# Patient Record
Sex: Female | Born: 1940
Health system: Southern US, Community
[De-identification: ages and names within clinical notes are randomized; demographics above are authoritative.]

## PROBLEM LIST (undated history)

## (undated) DIAGNOSIS — G629 Polyneuropathy, unspecified: Secondary | ICD-10-CM

## (undated) DIAGNOSIS — F32A Depression, unspecified: Secondary | ICD-10-CM

## (undated) DIAGNOSIS — M1990 Unspecified osteoarthritis, unspecified site: Secondary | ICD-10-CM

## (undated) DIAGNOSIS — IMO0002 Reserved for concepts with insufficient information to code with codable children: Secondary | ICD-10-CM

## (undated) DIAGNOSIS — E785 Hyperlipidemia, unspecified: Secondary | ICD-10-CM

## (undated) DIAGNOSIS — H9190 Unspecified hearing loss, unspecified ear: Secondary | ICD-10-CM

## (undated) DIAGNOSIS — K635 Polyp of colon: Secondary | ICD-10-CM

## (undated) DIAGNOSIS — M4800 Spinal stenosis, site unspecified: Secondary | ICD-10-CM

## (undated) DIAGNOSIS — T8859XA Other complications of anesthesia, initial encounter: Secondary | ICD-10-CM

## (undated) DIAGNOSIS — F319 Bipolar disorder, unspecified: Secondary | ICD-10-CM

## (undated) DIAGNOSIS — Z8739 Personal history of other diseases of the musculoskeletal system and connective tissue: Secondary | ICD-10-CM

## (undated) DIAGNOSIS — F329 Major depressive disorder, single episode, unspecified: Secondary | ICD-10-CM

## (undated) DIAGNOSIS — Z87440 Personal history of urinary (tract) infections: Secondary | ICD-10-CM

## (undated) DIAGNOSIS — R7303 Prediabetes: Secondary | ICD-10-CM

## (undated) DIAGNOSIS — I10 Essential (primary) hypertension: Secondary | ICD-10-CM

## (undated) DIAGNOSIS — D649 Anemia, unspecified: Secondary | ICD-10-CM

## (undated) DIAGNOSIS — E039 Hypothyroidism, unspecified: Secondary | ICD-10-CM

## (undated) DIAGNOSIS — K219 Gastro-esophageal reflux disease without esophagitis: Secondary | ICD-10-CM

## (undated) DIAGNOSIS — R7302 Impaired glucose tolerance (oral): Secondary | ICD-10-CM

## (undated) DIAGNOSIS — K5790 Diverticulosis of intestine, part unspecified, without perforation or abscess without bleeding: Secondary | ICD-10-CM

## (undated) DIAGNOSIS — R52 Pain, unspecified: Secondary | ICD-10-CM

## (undated) DIAGNOSIS — S90211A Contusion of right great toe with damage to nail, initial encounter: Secondary | ICD-10-CM

## (undated) DIAGNOSIS — T4145XA Adverse effect of unspecified anesthetic, initial encounter: Secondary | ICD-10-CM

## (undated) DIAGNOSIS — M67471 Ganglion, right ankle and foot: Secondary | ICD-10-CM

## (undated) HISTORY — DX: Personal history of other diseases of the musculoskeletal system and connective tissue: Z87.39

## (undated) HISTORY — DX: Impaired glucose tolerance (oral): R73.02

## (undated) HISTORY — DX: Unspecified osteoarthritis, unspecified site: M19.90

## (undated) HISTORY — DX: Major depressive disorder, single episode, unspecified: F32.9

## (undated) HISTORY — DX: Reserved for concepts with insufficient information to code with codable children: IMO0002

## (undated) HISTORY — DX: Polyp of colon: K63.5

## (undated) HISTORY — DX: Diverticulosis of intestine, part unspecified, without perforation or abscess without bleeding: K57.90

## (undated) HISTORY — DX: Spinal stenosis, site unspecified: M48.00

## (undated) HISTORY — DX: Depression, unspecified: F32.A

## (undated) HISTORY — DX: Essential (primary) hypertension: I10

## (undated) HISTORY — DX: Polyneuropathy, unspecified: G62.9

## (undated) HISTORY — DX: Hypothyroidism, unspecified: E03.9

## (undated) HISTORY — PX: TONSILLECTOMY AND ADENOIDECTOMY: SUR1326

## (undated) HISTORY — DX: Bipolar disorder, unspecified: F31.9

## (undated) HISTORY — PX: TOTAL KNEE ARTHROPLASTY: SHX125

## (undated) HISTORY — DX: Hyperlipidemia, unspecified: E78.5

## (undated) HISTORY — DX: Anemia, unspecified: D64.9

---

## 1974-06-17 HISTORY — PX: TUBAL LIGATION: SHX77

## 1998-07-28 ENCOUNTER — Encounter: Payer: Self-pay | Admitting: Orthopedic Surgery

## 1998-08-03 ENCOUNTER — Inpatient Hospital Stay (HOSPITAL_COMMUNITY): Admission: RE | Admit: 1998-08-03 | Discharge: 1998-08-07 | Payer: Self-pay | Admitting: Orthopedic Surgery

## 1998-08-10 ENCOUNTER — Encounter (HOSPITAL_COMMUNITY): Admission: RE | Admit: 1998-08-10 | Discharge: 1998-11-08 | Payer: Self-pay | Admitting: Orthopedic Surgery

## 2000-06-17 HISTORY — PX: ABDOMINAL HYSTERECTOMY: SHX81

## 2000-11-16 ENCOUNTER — Encounter: Payer: Self-pay | Admitting: Obstetrics and Gynecology

## 2000-11-19 ENCOUNTER — Encounter (INDEPENDENT_AMBULATORY_CARE_PROVIDER_SITE_OTHER): Payer: Self-pay

## 2000-11-19 ENCOUNTER — Inpatient Hospital Stay (HOSPITAL_COMMUNITY): Admission: RE | Admit: 2000-11-19 | Discharge: 2000-11-21 | Payer: Self-pay | Admitting: Obstetrics and Gynecology

## 2001-07-24 ENCOUNTER — Encounter: Payer: Self-pay | Admitting: Obstetrics and Gynecology

## 2001-07-24 ENCOUNTER — Encounter: Admission: RE | Admit: 2001-07-24 | Discharge: 2001-07-24 | Payer: Self-pay | Admitting: Obstetrics and Gynecology

## 2002-01-29 ENCOUNTER — Encounter: Admission: RE | Admit: 2002-01-29 | Discharge: 2002-01-29 | Payer: Self-pay | Admitting: Obstetrics and Gynecology

## 2002-01-29 ENCOUNTER — Encounter: Payer: Self-pay | Admitting: Obstetrics and Gynecology

## 2002-04-06 ENCOUNTER — Other Ambulatory Visit: Admission: RE | Admit: 2002-04-06 | Discharge: 2002-04-06 | Payer: Self-pay | Admitting: Obstetrics and Gynecology

## 2003-01-31 ENCOUNTER — Encounter: Admission: RE | Admit: 2003-01-31 | Discharge: 2003-01-31 | Payer: Self-pay | Admitting: Obstetrics and Gynecology

## 2003-01-31 ENCOUNTER — Encounter: Payer: Self-pay | Admitting: Obstetrics and Gynecology

## 2003-09-05 ENCOUNTER — Other Ambulatory Visit: Admission: RE | Admit: 2003-09-05 | Discharge: 2003-09-05 | Payer: Self-pay | Admitting: Obstetrics and Gynecology

## 2004-02-24 ENCOUNTER — Encounter: Admission: RE | Admit: 2004-02-24 | Discharge: 2004-02-24 | Payer: Self-pay | Admitting: Obstetrics and Gynecology

## 2004-03-12 ENCOUNTER — Encounter: Admission: RE | Admit: 2004-03-12 | Discharge: 2004-03-12 | Payer: Self-pay | Admitting: Neurology

## 2005-03-29 ENCOUNTER — Encounter: Admission: RE | Admit: 2005-03-29 | Discharge: 2005-03-29 | Payer: Self-pay | Admitting: Obstetrics and Gynecology

## 2005-06-13 ENCOUNTER — Ambulatory Visit (HOSPITAL_COMMUNITY): Admission: RE | Admit: 2005-06-13 | Discharge: 2005-06-13 | Payer: Self-pay | Admitting: Internal Medicine

## 2006-01-24 ENCOUNTER — Ambulatory Visit: Payer: Self-pay | Admitting: Internal Medicine

## 2006-02-05 ENCOUNTER — Ambulatory Visit: Payer: Self-pay | Admitting: Internal Medicine

## 2006-02-19 ENCOUNTER — Inpatient Hospital Stay (HOSPITAL_COMMUNITY): Admission: RE | Admit: 2006-02-19 | Discharge: 2006-02-22 | Payer: Self-pay | Admitting: Orthopedic Surgery

## 2006-04-09 ENCOUNTER — Encounter: Admission: RE | Admit: 2006-04-09 | Discharge: 2006-04-09 | Payer: Self-pay | Admitting: Internal Medicine

## 2006-07-01 ENCOUNTER — Ambulatory Visit (HOSPITAL_COMMUNITY): Admission: RE | Admit: 2006-07-01 | Discharge: 2006-07-01 | Payer: Self-pay | Admitting: Neurosurgery

## 2007-04-22 ENCOUNTER — Encounter: Admission: RE | Admit: 2007-04-22 | Discharge: 2007-04-22 | Payer: Self-pay | Admitting: Internal Medicine

## 2007-10-27 ENCOUNTER — Ambulatory Visit: Payer: Self-pay | Admitting: Internal Medicine

## 2007-11-03 ENCOUNTER — Encounter: Payer: Self-pay | Admitting: Internal Medicine

## 2007-11-03 ENCOUNTER — Ambulatory Visit: Payer: Self-pay | Admitting: Internal Medicine

## 2007-11-05 ENCOUNTER — Encounter: Payer: Self-pay | Admitting: Internal Medicine

## 2008-05-27 ENCOUNTER — Encounter: Admission: RE | Admit: 2008-05-27 | Discharge: 2008-05-27 | Payer: Self-pay | Admitting: Neurology

## 2008-08-02 ENCOUNTER — Encounter: Admission: RE | Admit: 2008-08-02 | Discharge: 2008-08-02 | Payer: Self-pay | Admitting: Internal Medicine

## 2008-08-02 ENCOUNTER — Encounter: Admission: RE | Admit: 2008-08-02 | Discharge: 2008-08-02 | Payer: Self-pay | Admitting: Neurology

## 2008-08-05 ENCOUNTER — Encounter: Admission: RE | Admit: 2008-08-05 | Discharge: 2008-08-05 | Payer: Self-pay | Admitting: Internal Medicine

## 2009-08-28 ENCOUNTER — Encounter: Admission: RE | Admit: 2009-08-28 | Discharge: 2009-08-28 | Payer: Self-pay | Admitting: Internal Medicine

## 2010-02-27 ENCOUNTER — Emergency Department (HOSPITAL_COMMUNITY): Admission: EM | Admit: 2010-02-27 | Discharge: 2010-02-27 | Payer: Self-pay | Admitting: Family Medicine

## 2010-07-07 ENCOUNTER — Encounter: Payer: Self-pay | Admitting: Internal Medicine

## 2010-07-08 ENCOUNTER — Encounter: Payer: Self-pay | Admitting: Internal Medicine

## 2010-08-30 LAB — COMPREHENSIVE METABOLIC PANEL
ALT: 17 U/L (ref 0–35)
AST: 19 U/L (ref 0–37)
Albumin: 4.3 g/dL (ref 3.5–5.2)
Alkaline Phosphatase: 87 U/L (ref 39–117)
BUN: 14 mg/dL (ref 6–23)
Chloride: 104 mEq/L (ref 96–112)
GFR calc Af Amer: >60 mL/min (ref >60)
Potassium: 4.2 mEq/L (ref 3.5–5.1)
Sodium: 140 mEq/L (ref 135–145)
Total Protein: 7.7 g/dL (ref 6.0–8.3)

## 2010-08-30 LAB — DIFFERENTIAL
Basophils Relative: 0 % (ref 0–1)
Eosinophils Relative: 3 % (ref 0–5)
Monocytes Absolute: 0.7 10*3/uL (ref 0.1–1.0)
Monocytes Relative: 8 % (ref 3–12)
Neutro Abs: 4.9 10*3/uL (ref 1.7–7.7)

## 2010-08-30 LAB — URINE CULTURE: Colony Count: 7000

## 2010-08-30 LAB — POCT URINALYSIS DIPSTICK
Glucose, UA: NEGATIVE mg/dL
Nitrite: NEGATIVE
Protein, ur: NEGATIVE mg/dL
Urobilinogen, UA: 0.2 mg/dL (ref 0.0–1.0)

## 2010-08-30 LAB — CBC
Platelets: 357 10*3/uL (ref 150–400)
RBC: 3.87 MIL/uL (ref 3.87–5.11)
RDW: 12.7 % (ref 11.5–15.5)
WBC: 8.3 10*3/uL (ref 4.0–10.5)

## 2010-10-24 ENCOUNTER — Other Ambulatory Visit: Payer: Self-pay | Admitting: Internal Medicine

## 2010-10-24 DIAGNOSIS — Z1231 Encounter for screening mammogram for malignant neoplasm of breast: Secondary | ICD-10-CM

## 2010-11-02 NOTE — Op Note (Signed)
Underwood:  Theresa Underwood, Theresa Underwood NO.:  [id number]   MEDICAL RECORD NO.:  [id number]          PATIENT TYPE:  INP   LOCATION:  0005                         FACILITY:  [facility]   PHYSICIAN:  Ollen Gross, M.D.    DATE OF BIRTH:  1940-12-05   DATE OF PROCEDURE:  02/19/2006  DATE OF DISCHARGE:                                 OPERATIVE REPORT   PREOPERATIVE DIAGNOSIS:  Osteoarthritis, right knee.   POSTOPERATIVE DIAGNOSIS:  Osteoarthritis, right knee.   PROCEDURE:  Right total knee arthroplasty.   SURGEON:  Ollen Gross, M.D.   ASSISTANT:  Alexzandrew L. Julien Girt, P.A.   ANESTHESIA:  General with postoperative Marcaine pain pump.   ESTIMATED BLOOD LOSS:  Minimal.   DRAINS:  Hemovac x1,   TOURNIQUET TIME:  35 minutes at 300 mmHg.   COMPLICATIONS:  None.   CONDITION:  Stable to recovery.   BRIEF CLINICAL NOTE:  [name] is a 70 year old female with end-stage  osteoarthritis of the right knee with intractable pain.  She presents now  for right total knee arthroplasty.   PROCEDURE IN DETAIL:  After successful administration of general anesthetic,  a tourniquet was placed high on the right thigh and right lower extremity  prepped and draped in usual sterile fashion.  Extremities was wrapped in  Esmarch, knee flexed, tourniquet inflated to 300 mmHg.  Midline incision was  made with a 10 blade through subcutaneous tissue to the level of the  extensor mechanism.  Fresh blade was used make a medial parapatellar  arthrotomy.  Soft tissue over the proximal medial tibia is subperiosteally  elevated to the joint line with the knife and into the semimembranosus bursa  with a Cobb elevator.  Soft tissue of the lateral tibia is also elevated  with attention being paid to avoid patellar tendon on tibial tubercle.  Patella subluxed laterally, knee flexed 90 degrees, ACL and PCL removed.  A  drill is used to create a starting hole in the distal femur, and canal is  thoroughly  irrigated.  A 5-degree right valgus alignment guide is placed and  referencing off the posterior condyles rotations marked and a block pinned  to remove 10 mm off of the distal femur.  Distal femoral resection is made  an oscillating saw.  Sizing block is placed and size 3 is most appropriate.  Rotations marked at the epicondylar axis.  Size 3 cutting block is placed  and the anterior, posterior and chamfer cuts are made.   Tibia subluxed forward and menisci removed.  Extramedullary tibial alignment  guide is placed referencing proximally at the medial aspect of the tibial  tubercle and distally along the second metatarsal axis and tibial crest.  The blocks pinned to remove 10 mm off the non-deficient lateral side.  Tibial resection is made with an oscillating saw.  Size 3 is the most  appropriate tibial component, and the proximal tibia is prepared with a  modular drill and keel punch for a size 3.  Femoral preparation is completed  with the intercondylar cut.   Size 3 mobile  bearing tibial trial with a size 3 posterior stabilized  femoral trial and 10-mm posterior stabilized rotating platform insert trial  are placed.  With the 10, full extension is achieved with excellent varus  and valgus balance throughout full range of motion.  The patella is then  everted and thickness measured to be 21 mm.  Freehand resection is taken to  11 mm, 38 templates placed, lug holes are drilled, trial patella is placed  and it tracks normally.  Osteophytes are then removed off of the posterior  femur with the trial in place.  All trials are removed and the cut bone  surfaces prepared with pulsatile lavage.  Cement mixed and once ready for  implantation a size 3 mobile bearing tibial tray, size 3 posterior  stabilized femur and 38 patella are cemented into place.  The patella was  held the clamp.  Trial 10-mm inserts placed and knee held in full extension  and all extruded cement removed.  Once the cement  is fully hardened, the  permanent 10-mm posterior stabilized rotating platform insert is placed into  the tibial tray.  The wound is then copiously irrigated with saline  solution.  The extensor mechanism closed over a Hemovac drain with  interrupted #1 PDS.  Flexion against gravity is 140 degrees.  Subcu is  closed with interrupted 2-0 Vicryl and subcuticular running 4-0 Monocryl.  The catheter for Marcaine pain pump is placed and the pump was initiated.  The drains hooked to suction and Steri-Strips and bulky sterile dressing  applied.  The right lower extremity is placed into a knee immobilizer, and  she is awakened and transported to recovery in stable condition.      Ollen Gross, M.D.  Electronically Signed     FA/MEDQ  D:  02/19/2006  T:  02/19/2006  Job:  [id number]

## 2010-11-02 NOTE — Discharge Summary (Signed)
NAME:  Theresa Underwood, Theresa Underwood NO.:  [id number]   MEDICAL RECORD NO.:  [id number]          PATIENT TYPE:  INP   LOCATION:  1504                         FACILITY:  [facility]   PHYSICIAN:  Ollen Gross, M.D.    DATE OF BIRTH:  Feb 20, 1941   DATE OF ADMISSION:  02/19/2006  DATE OF DISCHARGE:  02/22/2006                                 DISCHARGE SUMMARY   ADMITTING DIAGNOSES:  1. Osteoarthritis, right knee.  2. Depression.  3. Bipolar disorder.  4. Peripheral neuropathy.  5. Hypertension.  6. Occasional reflux disease.  7. Hemorrhoids.  8. Hypothyroidism.  9. History of anemia.  10.Lumbar degenerative disk disease.  11.Cervical degenerative disk disease.  12.Post menopausal.   DISCHARGE DIAGNOSES:  1. Osteoarthritis, right knee, status post right total knee arthroplasty.  2. Acute blood loss anemia, did not require transfusion.  3. Postoperative hyponatremia, improved.  4. Depression.  5. Bipolar disorder.  6. Peripheral neuropathy.  7. Hypertension.  8. Occasional reflux disease.  9. Hemorrhoids.  10.Hypothyroidism.  11.History of anemia.  12.Lumbar degenerative disk disease.  13.Cervical degenerative disk disease.  14.Post menopausal.   PROCEDURE:  On February 19, 2006, right total knee.  Surgeon:  Ollen Gross, M.D.  Assistant:  Alexzandrew L. Julien Girt, P.A.  Anesthesia:  General.   CONSULTATIONS:  None.   BRIEF HISTORY:  Ms. Lepkowski is a 70 year old female with end stage  osteoarthritis of the right knee with intractable pain now presents for a  total knee.   LABORATORY DATA:  Pre-op CBC:  Hemoglobin 12.6, hematocrit 36.3, white cell  count 7.4.  Followup CBC showed hemoglobin dropped down to 8.9, then down to  8.0, it came back to 8.4, last noted H&H was 8.6 and 24.9.  PT PTT on  admission were 12.6 and 31, respectively.  INR was 0.9.  Serial pro times  followed.  Last known PT INR 23.2 and 2.0.  Chem panel on admission elevated  potassium on admission  of 5.3 and elevated sodium of 147.  Remaining chem  panel within normal limits.  Serial B-METs are followed.  Potassium came  down to a normal level of 3.8.  Sodium did drop down to 134 back up to 138.  Pre-op UA negative.  Blood type B positive.   HOSPITAL COURSE:  Admitted to [facility], tolerated procedure  well, later transferred to the recovery room on the orthopedic floor,  started on PCA and p.o. analgesics for pain control following surgery,  started on Coumadin for DVT prophylaxis, was hurting a fair amount through  the night, did get some control with the PCA and p.o. meds, was doing fairly  well on day 1.  Hemoglobin was down to 8.9 but placed on iron supplements  but no symptoms.  Started with physical therapy by day 2 was sitting up in  the chair already on morning rounds, was doing better with pain control, did  get up and walk 140 feet on day 1 but did get a little tired.  Rechecked the  H&H, it was down to 8.0 but  at that time she had no symptoms.  Felt to be a  little positive on the volume, so she was given low dose Lasix, fluids were  discontinued, started getting up and ambulate even a little bit more, up  about 200 feet, did well with that.  She was progressing well.  Hemoglobin  was rechecked.  Hemoglobin had already started coming back up to 8.6, had no  symptoms, progressing well, and was discharged home on the following day of  February 22, 2006.   DISCHARGE PLAN:  The patient was discharged home on February 22, 2006.   DISCHARGE DIAGNOSES:  1. Osteoarthritis, right knee, status post right total knee arthroplasty.  2. Acute blood loss anemia, did not require transfusion.  3. Postoperative hyponatremia, improved.  4. Depression.  5. Bipolar disorder.  6. Peripheral neuropathy.  7. Hypertension.  8. Occasional reflux disease.  9. Hemorrhoids.  10.Hypothyroidism.  11.History of anemia.  12.Lumbar degenerative disk disease.  13.Cervical degenerative  disk disease.  14.Post menopausal.   DISCHARGE MEDICATIONS:  Coumadin, Percocet, Robaxin.   DIET:  Resume previous home diet.   ACTIVITY:  Weightbearing as tolerated to the right lower extremity.  Home  health PT, home health nursing, total knee protocol.  May start showering.   FOLLOWUP:  Follow up in two weeks.   DISPOSITION:  Home.   CONDITION ON DISCHARGE:  Improved.      Alexzandrew L. Julien Girt, P.A.      Ollen Gross, M.D.  Electronically Signed    ALP/MEDQ  D:  03/04/2006  T:  03/04/2006  Job:  [id number]   cc:   Gwen Pounds, MD  Fax: [phone]

## 2010-11-02 NOTE — Discharge Summary (Signed)
[facility] [place]  Patient:    Theresa Underwood, Theresa Underwood                       MRN: [id number] Adm. Date:  [id number] Disc. Date: [id number] Attending:  Madelyn Flavors                           Discharge Summary  HISTORY OF CURRENT ILLNESS:   The patient is a 70 year old, gravida 2, para 2, Caucasian female who was recently seen by Dr. Thomasena Edis in March complaining of symptomatic uterine prolapse. She was also complaining of cystocele and rectocele and stress urinary incontinence with coughing and sneezing. For further details, please see the dictated history and physical.  HOSPITAL COURSE:              The patient was admitted and underwent a TAH/BSO and posterior repair. The patient originally had been planned to have a vaginal hysterectomy but she became quite scared about developing ovarian cancer and thus, desired a TAH/BSO so that we could take a careful look at her ovaries and insure that they could be removed, and also so that we could look at all her peritoneal surfaces. She was found, after removal of the uterus, to only have a rectocele with no cystocele noted. Thus, she underwent a TAH/BSO and posterior repair. For further details, please see the operative report.  Postoperatively, the patient was receiving adequate analgesia, had excellent urine output. On postoperative day #1, her vaginal packing was removed, hemoglobin was 10.0, white count was 12,600, platelet count 301,000.  On postoperative day #2, the patient was doing well, Tmax 100.1, otherwise normal. There was no CVA tenderness, no calf tenderness, no vaginal bleeding. Posterior repair was noted to be intact without erythema. The patient was discharged home on November 21, 2000 on postoperative day #2. She did have a check of her CBC and urinalysis due to a low-grade temperature which were within normal limits. Her white count was 12,700 which was only slightly elevated and it was really no  change from the day before. Hemoglobin was 9.6. There was no left shift. Urinalysis was negative.  DISCHARGE MEDICATIONS:        1. Tylox one to two p.o. q.3-4h. p.r.n. pain.                               2. Colace 100 mg p.o. b.i.d. for seven days.   DISCHARGE INSTRUCTIONS:       She was urged to place nothing in her rectum, do no heavy lifting, and call with any problems.  DISCHARGE FOLLOWUP:           She is to return to the office in seven days for staples to be removed.  DD:  01/16/01 TD:  01/16/01 Job: [id number]

## 2010-11-02 NOTE — H&P (Signed)
NAME:  Theresa, Underwood NO.:  [id number]   MEDICAL RECORD NO.:  [id number]          PATIENT TYPE:  INP   LOCATION:  1504                         FACILITY:  [facility]   PHYSICIAN:  Ollen Gross, M.D.    DATE OF BIRTH:  [birth year]   DATE OF ADMISSION:  02/19/2006  DATE OF DISCHARGE:                                HISTORY & PHYSICAL   CHIEF COMPLAINT:  Right knee pain.   HISTORY OF PRESENT ILLNESS:  The patient is a 70 year old female who has  been seen by Dr. Lequita Halt for ongoing knee pain.  She has had a previous left  total knee and has done well with it.  The right knee has been giving her  problems for quite some time now.  It has progressively gotten worse.  It is  interfering with her activities.  She has been seen and found to have  significant arthritis with bone-on-bone changes.  It is felt she would  benefit from  undergoing replacement.  Risks and benefits were discussed  with the patient who understands and was admitted to hospital.   ALLERGIES:  SULFA, TEGRETOL.  INTOLERANCES:  OXYCODONE CAUSES SOME MILD  SICKNESS AND STOMACH PROBLEMS.   CURRENT MEDICATIONS:  Gabapentin, lisinopril, Synthroid, clonazepam,  calcium, vitamin D, women's multivitamin, slow release iron, ibuprofen.   PAST MEDICAL HISTORY:  Some history of depression, bipolar disorder,  hypertension, occasional reflux, hemorrhoids, hypothyroidism, history of  anemia, post menopausal.  Peripheral neuropathy.   PAST SURGICAL HISTORY:  Tonsillectomy, adenoidectomy age 70.  Bilateral tubal  ligation.  She has undergone a left total knee arthroplasty in 2000, also a  total abdominal hysterectomy.   SOCIAL HISTORY:  Married.  She is retired Engineer, civil (consulting) and also Network engineer.  Nonsmoker.  Occasional intake of alcohol.  Has two children.   FAMILY HISTORY:  Father with history of hypertension.  Mother with history  of arthritis, anemia with father and has a daughter who is diabetic.   REVIEW OF  SYSTEMS:  GENERAL:  No fever, chills, night sweats.  NEUROLOGIC:  No seizures, syncope, paralysis.  RESPIRATORY:  No shortness of breath,  productive cough or hemoptysis.  CARDIAC:  No chest pain __________  GI:  No  nausea, vomiting, diarrhea or constipation.  GENITOURINARY:  Has a little  bit of urgency, no dysuria, hematuria.  MUSCULOSKELETAL:  Right knee.   PHYSICAL EXAMINATION:  VITAL SIGNS:  Pulse 68, respiratory rate 14, blood  pressure 120/68.  GENERAL:  The patient is a 70 year old white female well-developed, well-  nourished, in no acute distress, alert, oriented, cooperative.  Pleasant.  Appears to be a good historian.  HEENT:  Normocephalic and atraumatic, pupils equal, round reactive.  Oropharynx is clear.  EOMs intact.  NECK:  Supple, no carotid bruits are appreciated on exam.  CHEST:  Clear anterior and posterior chest.  No rales, rhonchi, rales or  wheezing.  HEART:  Regular rhythm with a faint early sytolic ejection murmur best heard  over aortic point graded 2/6.  ABDOMEN:  Soft, nontender, bowel sounds present.  RECTUM/BREAST/GENITALIA:  Not done, not pertinent present illness.  EXTREMITIES:  Right knee shows range of motion of 5 to 125, marked crepitus  is noted, more tender medial than lateral.   IMPRESSION:  1. Osteoarthritis right knee.  2. Depression.  3. Bipolar disorder.  4. Peripheral neuropathy.  5. Hypertension.  6. Occasional reflux disease.  7. Hemorrhoids.  8. Hypothyroidism.  9. History of anemia.  10.Lumbar degenerative disk disease.  11.Cervical degenerative disk disease.  12.Post menopausal.   PLAN:  Patient admitted [facility], undergo a right  total knee arthroplasty.  Surgery will be performed by Dr. Ollen Gross.      Alexzandrew L. Julien Girt, P.A.      Ollen Gross, M.D.  Electronically Signed    ALP/MEDQ  D:  02/21/2006  T:  02/21/2006  Job:  [id number]   cc:   Gwen Pounds, MD  Fax: [phone]

## 2010-11-02 NOTE — Op Note (Signed)
[facility]   Patient:    Theresa Underwood, MADANI                       MRN: [id number] Adm. Date:  [id number] Attending:  Madelyn Flavors CC:         Georgina Peer, M.D.   Operative Report  PREOPERATIVE DIAGNOSIS:       Symptomatic uterine prolapse 618.1, cystocele and rectocele 618.0.  POSTOPERATIVE DIAGNOSIS:      Symptomatic uterine prolapse 618.1, cystocele and rectocele 618.0.  Resolved cystocele after TAH.  OPERATION:                    Total abdominal hysterectomy, bilateral salpingo-oophorectomy, posterior colporrhaphy.  SURGEON:                      Beather Arbour. Thomasena Edis, M.D.  ASSISTANT:                    Georgina Peer, M.D.  ANESTHESIA:                   General endotracheal anesthesia.  ESTIMATED BLOOD LOSS:         350 cc.  FLUIDS:                       Approximately 3200 cc of crystalloid.  DRAINS:                       Foley.  COMPLICATIONS:                None.  Vaginal packing in place.  FINDINGS:                     No evidence of ovarian pathology.  No enlarged nodes and all peritoneal surfaces noted to be smooth.  DESCRIPTION OF PROCEDURE:     The patient was brought to the operating room and identified on the operating table.  After induction of adequate general endotracheal anesthesia, the patient was placed in the low lithotomy position and prepped and draped in the usual sterile fashion.  A Foley catheter was placed.  The patients friend from nursing school with recently diagnosed with ovarian cancer after a normal ultrasound in January, thus the patient was adamant that her ovaries be removed in their entirety and desired [place] to inspect her peritoneal surfaces.  The case had been initially scheduled for [facility].  After placing the patient in the low lithotomy position, a Pfannenstiel incision was made and carried down to the fascia.  The fascia was scored in the midline and extended bilaterally using Mayo scissors.  It  was then separated free from the underlying muscles.  The muscles were separated in the midline down to the symphysis.  The patient was placed in Trendelenburg and the peritoneum was entered sharply and carefully taking care to avoid bowel or other abdominal contents.  The peritoneal incision was then extended superiorly and then inferiorly down to the bladder edge.  The OConnor-OSullivan retractor was placed, the bladder blade was placed, the bowel was packed out of the operative field, and the fourth-arm retractor was placed.  Prior to packing the bowel, all peritoneal surfaces were examined and noted to be smooth, the liver edge was smooth.  There were no enlarged pelvic or peri-aortic nodes.  The uterus was freely mobile and elevated up to the  level of the abdominal incision.  Long Kelly clamps were placed at either lateral border of the uterus.  The right round ligament was ligated with a suture of 0 Monocryl, divided using cautery, and the anterior leaf of the broad ligament was divided to the area overlying the internal os.  The clear space was developed and the infundibulopelvic ligament was clamped taking care to not leave any ovary behind.  It was then cut, tied with a free tie, and then a suture ligature of 0 Monocryl.  This was accomplished on the left in a similar fashion.  The vessels on the right were skeletonized.  There was noted to be a small fibroid approximately 3 cm at the level of the internal os on the right.  After the vessels were skeletonized on the right, the vessels were clamped with a Heaney, cut, and suture ligated using a suture of 0 Monocryl. There was noted to be a slight amount of oozing from one vessel, but excellent hemostasis was achieved using a right angle clamp across this vessel and ligating it.  In a similar fashion, the vessels were skeletonized on the left.  The uterine vessels were clamped, cut, and suture ligated using a suture of 0  Monocryl. Successive cardinal and uterosacral ligament bites were taken bilaterally using straight Heaney clamps.  The uterosacral ligaments on the right were clamped with a curved Heaney, cut, and suture ligated using a figure-of-eight suture of 0 Monocryl.  A curved Heaney was placed across the distal portion of the cervix, cut, and the vagina was entered.  A figure-of-eight suture of 0 Monocryl was placed.  This was accomplished on the left in a similar fashion. Using Satinsky scissors the cervix was separated from the vagina.  The Kochers were used to graft the vaginal mucosa and the vagina was closed using interrupted figure-of-eight sutures of 0 Monocryl.  All pedicles were examined for evidence of hemostasis and excellent hemostasis was noted.  The pelvis was irrigated copiously with warm Ringers lactate.  Again all pedicles were noted to be very hemostatic.  Both ureters were examined and noted to be well away from any of the operative sites and were noted to be parastalsing normally. The uterosacral ligaments were plicated using a suture of 0 Monocryl.  At that point, the patient was taken out of Trendelenburg, the abdominal packs were removed.  The appendix was noted to be normal.  The OConnor-OSullivan retractor was removed.  The Kelly clamps were placed on the peritoneum and the peritoneum was closed using a simple running suture of 2-0 Monocryl.  The subfascial areas were examined for evidence of hemostasis and excellent hemostasis achieved using cautery.  The fascia was closed using two sutures of 0 PDS with each suture anchored at either angle of the incision, run to the midline, and tied.  The skin was closed with staples.  Attention was then turned to the anterior and posterior repair.  The patient was then placed in higher lithotomy position.  It should be noted that when the patient came into the operating room she was allowed to position herself in the stirrups due  to her knee replacement surgery in such a way that she was very comfortable.  The  cystocele and rectocele were again examined.  There was noted to be totally resolved cystocele with excellent UV angle support after removal of the uterus. However, there was noted to be a larger rectocele than had been noted with the patients examinations in the  office.  The decision was made to proceed with rectocele repair.  Allis clamps were placed at approximately 2.5 cm bilaterally away from the midline and a V-shaped incision was performed. The vaginal mucosa was then incised in the midline using Metzenbaum scissors and Allis clamps were used to give traction to this areas and hold them out of the way.  This was taken up in the midline to the apex.  Using sharp dissection taking care to not buttonhole the skin, the perirectal fascia was taken down from the vaginal mucosa.  This was accomplished on the left in a similar fashion.  I was able to sweep this fascia from the vaginal mucosa very high up, very close to the apex.  There was noted to be one small bleeding vessel and excellent hemostasis was achieved using a figure-of-eight suture of 0 Monocryl.  After the perirectal fascia was mobilized, the fascia was closed using simple interrupted sutures of 0 Monocryl.  The levators were then reapproximated using a suture of 0 Monocryl.  Excellent levator support was noted.  The excess mucosa was excised and using sutures of 2-0 Monocryl, the vaginal mucosa was closed using a simple running interlocking sutures.  An additional suture was placed in the perineum and the perineal portion of the incision was closed using subcuticular sutures of 2-0 Monocryl.  A vaginal packing was placed.  There was noted to be excellent hemostasis.  The patient tolerated the procedure well without apparent complications and was transferred to the recovery room in stable condition after all instrument, sponge, and needle counts  were correct.  In addition it should be noted that there was known to be excellent levator support after rectocele repair, I was able to place three fingers into the vagina such that the patient should have no problems with intromission.  In addition the cystocele was noted to be even further reduced such that the patient had no cystocele after the rectocele repair. DD:  11/19/00 TD:  11/19/00 Job: [id number]

## 2010-11-19 ENCOUNTER — Ambulatory Visit
Admission: RE | Admit: 2010-11-19 | Discharge: 2010-11-19 | Disposition: A | Discharge status: Home / Self Care | Payer: Medicare Other | Source: Ambulatory Visit | Attending: Internal Medicine | Admitting: Internal Medicine

## 2010-11-19 DIAGNOSIS — Z1231 Encounter for screening mammogram for malignant neoplasm of breast: Secondary | ICD-10-CM

## 2011-06-19 DIAGNOSIS — M9981 Other biomechanical lesions of cervical region: Secondary | ICD-10-CM | POA: Diagnosis not present

## 2011-06-19 DIAGNOSIS — M5030 Other cervical disc degeneration, unspecified cervical region: Secondary | ICD-10-CM | POA: Diagnosis not present

## 2011-06-21 DIAGNOSIS — M5412 Radiculopathy, cervical region: Secondary | ICD-10-CM | POA: Diagnosis not present

## 2011-06-21 DIAGNOSIS — M9981 Other biomechanical lesions of cervical region: Secondary | ICD-10-CM | POA: Diagnosis not present

## 2011-06-21 DIAGNOSIS — M5030 Other cervical disc degeneration, unspecified cervical region: Secondary | ICD-10-CM | POA: Diagnosis not present

## 2011-06-25 DIAGNOSIS — M5030 Other cervical disc degeneration, unspecified cervical region: Secondary | ICD-10-CM | POA: Diagnosis not present

## 2011-06-25 DIAGNOSIS — M5412 Radiculopathy, cervical region: Secondary | ICD-10-CM | POA: Diagnosis not present

## 2011-06-25 DIAGNOSIS — M9981 Other biomechanical lesions of cervical region: Secondary | ICD-10-CM | POA: Diagnosis not present

## 2011-06-26 DIAGNOSIS — M9981 Other biomechanical lesions of cervical region: Secondary | ICD-10-CM | POA: Diagnosis not present

## 2011-06-26 DIAGNOSIS — M5412 Radiculopathy, cervical region: Secondary | ICD-10-CM | POA: Diagnosis not present

## 2011-06-26 DIAGNOSIS — M5030 Other cervical disc degeneration, unspecified cervical region: Secondary | ICD-10-CM | POA: Diagnosis not present

## 2011-06-27 DIAGNOSIS — M5030 Other cervical disc degeneration, unspecified cervical region: Secondary | ICD-10-CM | POA: Diagnosis not present

## 2011-06-27 DIAGNOSIS — M9981 Other biomechanical lesions of cervical region: Secondary | ICD-10-CM | POA: Diagnosis not present

## 2011-06-28 DIAGNOSIS — M5030 Other cervical disc degeneration, unspecified cervical region: Secondary | ICD-10-CM | POA: Diagnosis not present

## 2011-06-28 DIAGNOSIS — M9981 Other biomechanical lesions of cervical region: Secondary | ICD-10-CM | POA: Diagnosis not present

## 2011-07-08 DIAGNOSIS — D649 Anemia, unspecified: Secondary | ICD-10-CM | POA: Diagnosis not present

## 2011-07-15 ENCOUNTER — Encounter: Payer: Self-pay | Admitting: Internal Medicine

## 2011-07-24 DIAGNOSIS — I10 Essential (primary) hypertension: Secondary | ICD-10-CM | POA: Diagnosis not present

## 2011-07-24 DIAGNOSIS — D649 Anemia, unspecified: Secondary | ICD-10-CM | POA: Diagnosis not present

## 2011-07-24 DIAGNOSIS — H9319 Tinnitus, unspecified ear: Secondary | ICD-10-CM | POA: Diagnosis not present

## 2011-07-24 DIAGNOSIS — E039 Hypothyroidism, unspecified: Secondary | ICD-10-CM | POA: Diagnosis not present

## 2011-08-02 ENCOUNTER — Encounter: Payer: Self-pay | Admitting: Internal Medicine

## 2011-08-02 ENCOUNTER — Ambulatory Visit (INDEPENDENT_AMBULATORY_CARE_PROVIDER_SITE_OTHER): Payer: Medicare Other | Admitting: Internal Medicine

## 2011-08-02 VITALS — BP 120/68 | HR 76 | Ht 67.0 in | Wt 144.6 lb

## 2011-08-02 DIAGNOSIS — Z86010 Personal history of colonic polyps: Secondary | ICD-10-CM

## 2011-08-02 DIAGNOSIS — D509 Iron deficiency anemia, unspecified: Secondary | ICD-10-CM | POA: Diagnosis not present

## 2011-08-02 DIAGNOSIS — K3189 Other diseases of stomach and duodenum: Secondary | ICD-10-CM

## 2011-08-02 DIAGNOSIS — R1013 Epigastric pain: Secondary | ICD-10-CM | POA: Diagnosis not present

## 2011-08-02 MED ORDER — PEG-KCL-NACL-NASULF-NA ASC-C 100 G PO SOLR: 1.0000 | Freq: Once | ORAL | Status: DC

## 2011-08-02 NOTE — Progress Notes (Signed)
HISTORY OF PRESENT ILLNESS:  Theresa Underwood is a 71 y.o. female with multiple medical problems as listed below. She is sent today regarding anemia. The patient was last seen in 2009 for colonoscopy. She has been feeling well. She underwent her routine annual evaluation with Dr. Timothy Lasso. She was found to be anemic with a hemoglobin of 11.4 on 07/08/2011. MCV normal. Iron saturation normal as was the reticulocyte count. Hemoccult study from October negative. Ferritin B12 and iron saturation from October normal. Recently placed on iron with followup hemoglobin 12.7. This was obtained 07/24/2011. GI review of systems is remarkable for infrequent heartburn. As well epigastric discomfort. She avoids NSAIDs due to gastric distress. She does have occasional constipation with minor rectal bleeding. Currently on iron. Does not require acid suppressive medication. Initial colonoscopy in 2004 was remarkable for left-sided diverticulosis. Followup exam in 2009 revealed diverticulosis and right-sided hyperplastic colon polyps. Followup in 5 years recommended. She also had an upper endoscopy in August of 2007 to evaluate normocytic anemia. As well epigastric discomfort. This was normal. She tells me that her mother had a history of serious ulcer disease associated with bleeding and H. Pylori. She is concerned.  REVIEW OF SYSTEMS:  All non-GI ROS negative except for hearing problems and lower extremity neuropathy  Past Medical History  Diagnosis Date  . Diverticulosis   . Hypertension   . Hyperthyroidism   . Glucose intolerance (impaired glucose tolerance)   . Peripheral neuropathy     bilateral feet, Dr. Sandria Manly  . Bipolar disorder   . Hyperlipidemia   . Spinal stenosis   . Cholelithiasis   . Anemia   . Colon polyps   . Hemorrhoids   . Arthritis   . DDD (degenerative disc disease)   . Depression   . Prolapsed bladder   . Tinnitus   . History of TMJ disorder     Past Surgical History  Procedure Date  .  Total knee arthroplasty     bi-lateral 2000,2007  . Abdominal hysterectomy 2002    total  . Tonsillectomy and adenoidectomy     as a child  . Tubial   . Tubal ligation     Social History Theresa Underwood  reports that she has never smoked. She has never used smokeless tobacco. She reports that she drinks alcohol. She reports that she does not use illicit drugs.  family history includes Bipolar disorder in her mother; Colon polyps in her father and mother; Diverticulitis in her sister; Osteoporosis in her mother; Pancreatic cancer in her maternal grandmother; and Peripheral vascular disease in her maternal grandmother.  There is no history of Colon cancer.  Allergies  Allergen Reactions  . Carbamazepine     REACTION: hives  . Sulfonamide Derivatives     REACTION: hives       PHYSICAL EXAMINATION: Vital signs: BP 120/68  Pulse 76  Ht 5\' 7"  (1.702 m)  Wt 144 lb 9.6 oz (65.59 kg)  BMI 22.65 kg/m2  Constitutional: generally well-appearing, no acute distress Psychiatric: alert and oriented x3, cooperative Eyes: extraocular movements intact, anicteric, conjunctiva pink. Contact lenses Mouth: oral pharynx moist, no lesions Neck: supple no lymphadenopathy Cardiovascular: heart regular rate and rhythm, no murmur Lungs: clear to auscultation bilaterally Abdomen: soft, nontender, nondistended, no obvious ascites, no peritoneal signs, normal bowel sounds, no organomegaly Rectal:deferred until colonoscopy Extremities: no lower extremity edema bilaterally Skin: no lesions on visible extremities Neuro: No focal deficits. N  ASSESSMENT:  #1. Normocytic anemia. Not clearly iron  deficient. Improvement in hemoglobin on iron however. Hemoccult negative studies in October. Prior GI evaluations as noted. #2. History of right-sided hyperplastic polyps. Due for followup 2014. #3. Dyspeptic symptoms #4. General medical problems   PLAN:  #1. Colonoscopy for polyp surveillance and  evaluation of possible iron deficiency anemia.The nature of the procedure, as well as the risks, benefits, and alternatives were carefully and thoroughly reviewed with the patient. Ample time for discussion and questions allowed. The patient understood, was satisfied, and agreed to proceed. Movi prep prescribed. The patient instructed on its use excellent  #2. Upper endoscopy to evaluate dyspeptic symptoms and possible iron deficiency anemia. Can biopsy to rule out H. pylori.The nature of the procedure, as well as the risks, benefits, and alternatives were carefully and thoroughly reviewed with the patient. Ample time for discussion and questions allowed. The patient understood, was satisfied, and agreed to proceed.  #3. Continue iron until one week prior to colonoscopy. May resume thereafter if needed.

## 2011-08-02 NOTE — Patient Instructions (Signed)
You have been scheduled for an endoscopy and colonoscopy with propofol. Please follow the written instructions given to you at your visit today. Please pick up your prep at the pharmacy within the next 1-3 days.  

## 2011-08-06 ENCOUNTER — Encounter: Payer: Self-pay | Admitting: Internal Medicine

## 2011-08-07 ENCOUNTER — Encounter: Payer: Self-pay | Admitting: Internal Medicine

## 2011-08-07 ENCOUNTER — Ambulatory Visit (AMBULATORY_SURGERY_CENTER): Payer: Medicare Other | Admitting: Internal Medicine

## 2011-08-07 VITALS — BP 137/73 | HR 59 | Temp 97.2°F | Resp 14 | Ht 67.0 in | Wt 144.0 lb

## 2011-08-07 DIAGNOSIS — D509 Iron deficiency anemia, unspecified: Secondary | ICD-10-CM

## 2011-08-07 DIAGNOSIS — K222 Esophageal obstruction: Secondary | ICD-10-CM

## 2011-08-07 DIAGNOSIS — D649 Anemia, unspecified: Secondary | ICD-10-CM | POA: Diagnosis not present

## 2011-08-07 DIAGNOSIS — D126 Benign neoplasm of colon, unspecified: Secondary | ICD-10-CM | POA: Diagnosis not present

## 2011-08-07 DIAGNOSIS — E079 Disorder of thyroid, unspecified: Secondary | ICD-10-CM | POA: Diagnosis not present

## 2011-08-07 DIAGNOSIS — Z1211 Encounter for screening for malignant neoplasm of colon: Secondary | ICD-10-CM

## 2011-08-07 DIAGNOSIS — Z86010 Personal history of colonic polyps: Secondary | ICD-10-CM

## 2011-08-07 DIAGNOSIS — F3289 Other specified depressive episodes: Secondary | ICD-10-CM | POA: Diagnosis not present

## 2011-08-07 DIAGNOSIS — K5730 Diverticulosis of large intestine without perforation or abscess without bleeding: Secondary | ICD-10-CM | POA: Diagnosis not present

## 2011-08-07 DIAGNOSIS — D1330 Benign neoplasm of unspecified part of small intestine: Secondary | ICD-10-CM | POA: Diagnosis not present

## 2011-08-07 DIAGNOSIS — I10 Essential (primary) hypertension: Secondary | ICD-10-CM | POA: Diagnosis not present

## 2011-08-07 DIAGNOSIS — F329 Major depressive disorder, single episode, unspecified: Secondary | ICD-10-CM | POA: Diagnosis not present

## 2011-08-07 MED ORDER — SODIUM CHLORIDE 0.9 % IV SOLN: 500.0000 mL | INTRAVENOUS | Status: DC

## 2011-08-07 NOTE — Progress Notes (Signed)
Patient did not experience any of the following events: a burn prior to discharge; a fall within the facility; wrong site/side/patient/procedure/implant event; or a hospital transfer or hospital admission upon discharge from the facility. (G8907) Patient did not have preoperative order for IV antibiotic SSI prophylaxis. (G8918)  

## 2011-08-07 NOTE — Progress Notes (Signed)
Propofol per [facility] camp crna per protocol. See scanned intra procedure report. ewm 

## 2011-08-07 NOTE — Patient Instructions (Signed)
YOU HAD AN ENDOSCOPIC PROCEDURE TODAY AT THE [facility]: Refer to the procedure report that was given to you for any specific questions about what was found during the examination.  If the procedure report does not answer your questions, please call your gastroenterologist to clarify.  If you requested that your care partner not be given the details of your procedure findings, then the procedure report has been included in a sealed envelope for you to review at your convenience later.  YOU SHOULD EXPECT: Some feelings of bloating in the abdomen. Passage of more gas than usual.  Walking can help get rid of the air that was put into your GI tract during the procedure and reduce the bloating. If you had a lower endoscopy (such as a colonoscopy or flexible sigmoidoscopy) you may notice spotting of blood in your stool or on the toilet paper. If you underwent a bowel prep for your procedure, then you may not have a normal bowel movement for a few days.  DIET: Your first meal following the procedure should be a light meal and then it is ok to progress to your normal diet.  A half-sandwich or bowl of soup is an example of a good first meal.  Heavy or fried foods are harder to digest and may make you feel nauseous or bloated.  Likewise meals heavy in dairy and vegetables can cause extra gas to form and this can also increase the bloating.  Drink plenty of fluids but you should avoid alcoholic beverages for 24 hours.  ACTIVITY: Your care partner should take you home directly after the procedure.  You should plan to take it easy, moving slowly for the rest of the day.  You can resume normal activity the day after the procedure however you should NOT DRIVE or use heavy machinery for 24 hours (because of the sedation medicines used during the test).    SYMPTOMS TO REPORT IMMEDIATELY: A gastroenterologist can be reached at any hour.  During normal business hours, 8:30 AM to 5:00 PM Monday through Friday,  call [phone].  After hours and on weekends, please call the GI answering service at [phone] who will take a message and have the physician on call contact you.   Following lower endoscopy (colonoscopy or flexible sigmoidoscopy):  Excessive amounts of blood in the stool  Significant tenderness or worsening of abdominal pains  Swelling of the abdomen that is new, acute  Fever of 100F or higher  Following upper endoscopy (EGD)  Vomiting of blood or coffee ground material  New chest pain or pain under the shoulder blades  Painful or persistently difficult swallowing  New shortness of breath  Fever of 100F or higher  Black, tarry-looking stools  FOLLOW UP: If any biopsies were taken you will be contacted by phone or by letter within the next 1-3 weeks.  Call your gastroenterologist if you have not heard about the biopsies in 3 weeks.  Our staff will call the home number listed on your records the next business day following your procedure to check on you and address any questions or concerns that you may have at that time regarding the information given to you following your procedure. This is a courtesy call and so if there is no answer at the home number and we have not heard from you through the emergency physician on call, we will assume that you have returned to your regular daily activities without incident.  SIGNATURES/CONFIDENTIALITY: You and/or your care   partner have signed paperwork which will be entered into your electronic medical record.  These signatures attest to the fact that that the information above on your After Visit Summary has been reviewed and is understood.  Full responsibility of the confidentiality of this discharge information lies with you and/or your care-partner.  

## 2011-08-07 NOTE — Op Note (Signed)
[facility] [address] [place], [place]  [address]  ENDOSCOPY PROCEDURE REPORT  PATIENT:  Theresa, Underwood  MR#:  [id number] BIRTHDATE:  12-07-1940, 70 yrs. old  GENDER:  female  ENDOSCOPIST:  Wilhemina Bonito. Eda Keys, MD Referred by:  Office  PROCEDURE DATE:  08/07/2011 PROCEDURE:  EGD with biopsy, 78[address] ASA CLASS:  Class II INDICATIONS:  ? iron deficiency anemia, dyspepsia  MEDICATIONS:   MAC sedation, administered by CRNA, propofol (Diprivan) 100 mg IV TOPICAL ANESTHETIC:  none  DESCRIPTION OF PROCEDURE:   After the risks benefits and alternatives of the procedure were thoroughly explained, informed consent was obtained.  The [facility] GIF-[id number] [id number] endoscope was introduced through the mouth and advanced to the third portion of the duodenum, without limitations.  The instrument was slowly withdrawn as the mucosa was fully examined. <<PROCEDUREIMAGES>>  The upper, middle, and distal third of the esophagus were carefully inspected and no abnormalities were noted. Possible distal ring.. The z-line was well seen at the GEJ. The endoscope was pushed into the fundus which was normal including a retroflexed view. The antrum,gastric body, first and second/ third part of the duodenum were unremarkable. Post bulbar bx taken. Retroflexed views revealed no abnormalities.    The scope was then withdrawn from the patient and the procedure completed.  COMPLICATIONS:  None  ENDOSCOPIC IMPRESSION: 1) Essentially Normal EGD 2) S/P duodenal biopsies  RECOMMENDATIONS: 1) Await biopsy results 2) Continue iron for 4 weeks then stop 3) GI follow up as needed  ______________________________ Wilhemina Bonito. Eda Keys, MD  CC:  Creola Corn, MD;  The Patient  n. eSIGNED:   Wilhemina Bonito. Eda Keys at 08/07/2011 03:16 PM  Aggie Moats, [phone]

## 2011-08-07 NOTE — Op Note (Signed)
[facility] [address] [place], [place]  [address]  COLONOSCOPY PROCEDURE REPORT  PATIENT:  Theresa, Underwood  MR#:  [id number] BIRTHDATE:  March 30, 1941, 70 yrs. old  GENDER:  female ENDOSCOPIST:  Wilhemina Bonito. Eda Keys, MD REF. BY:  Office PROCEDURE DATE:  08/07/2011 PROCEDURE:  Surveillance Colonoscopy ASA CLASS:  Class II INDICATIONS:  history of hyperplastic polyps, surveillance and high-risk screening, Anemia (? iron def) ; LAST EXAM 2009 W/ RIGHT SIDED HPP MEDICATIONS:   MAC sedation, administered by CRNA, propofol (Diprivan) 250 mg IV  DESCRIPTION OF PROCEDURE:   After the risks benefits and alternatives of the procedure were thoroughly explained, informed consent was obtained.  Digital rectal exam was performed and revealed no abnormalities.   The LB CF-[id number] endoscope was introduced through the anus and advanced to the cecum, which was identified by both the appendix and ileocecal valve, without limitations.  The quality of the prep was excellent, using MoviPrep.  The instrument was then slowly withdrawn as the colon was fully examined. <<PROCEDUREIMAGES>>  FINDINGS:  Moderate diverticulosis was found in the left colon. Otherwise normal colonoscopy without  polyps, masses, vascular ectasias, or inflammatory changes.   Retroflexed views in the rectum revealed no abnormalities.    The time to cecum = 4:51 minutes. The scope was then withdrawn in  9:30  minutes from the cecum and the procedure completed.  COMPLICATIONS:  None  ENDOSCOPIC IMPRESSION: 1) Moderate diverticulosis in the left colon 2) Otherwise normal colonoscopy  RECOMMENDATIONS: 1) Continue current colorectal screening recommendations for "routine risk" patients with a repeat colonoscopy in 10 years. 2) Upper endoscopy today  ______________________________ Wilhemina Bonito. Eda Keys, MD  CC:  Creola Corn, MD;  The Patient  n. eSIGNED:   Wilhemina Bonito. Eda Keys at 08/07/2011 03:10 PM  Aggie Moats, [phone]

## 2011-08-08 ENCOUNTER — Telehealth: Payer: Self-pay

## 2011-08-08 NOTE — Telephone Encounter (Signed)
  Follow up Call-  Call back number 08/07/2011  Post procedure Call Back phone  # [phone]  Permission to leave phone message Yes     Patient questions:  Do you have a fever, pain , or abdominal swelling? no Pain Score  0 *  Have you tolerated food without any problems? yes  Have you been able to return to your normal activities? yes  Do you have any questions about your discharge instructions: Diet   no Medications  no Follow up visit  no  Do you have questions or concerns about your Care? no  Actions: * If pain score is 4 or above: No action needed, pain <4. I am okay this morning. Maw

## 2011-08-12 ENCOUNTER — Encounter: Payer: Self-pay | Admitting: Internal Medicine

## 2011-09-30 DIAGNOSIS — I10 Essential (primary) hypertension: Secondary | ICD-10-CM | POA: Diagnosis not present

## 2011-09-30 DIAGNOSIS — G609 Hereditary and idiopathic neuropathy, unspecified: Secondary | ICD-10-CM | POA: Diagnosis not present

## 2011-09-30 DIAGNOSIS — E039 Hypothyroidism, unspecified: Secondary | ICD-10-CM | POA: Diagnosis not present

## 2011-09-30 DIAGNOSIS — D649 Anemia, unspecified: Secondary | ICD-10-CM | POA: Diagnosis not present

## 2011-10-10 DIAGNOSIS — H109 Unspecified conjunctivitis: Secondary | ICD-10-CM | POA: Diagnosis not present

## 2011-10-28 DIAGNOSIS — H9319 Tinnitus, unspecified ear: Secondary | ICD-10-CM | POA: Diagnosis not present

## 2011-10-28 DIAGNOSIS — G609 Hereditary and idiopathic neuropathy, unspecified: Secondary | ICD-10-CM | POA: Diagnosis not present

## 2012-01-01 ENCOUNTER — Other Ambulatory Visit: Payer: Self-pay | Admitting: Internal Medicine

## 2012-01-01 DIAGNOSIS — Z1231 Encounter for screening mammogram for malignant neoplasm of breast: Secondary | ICD-10-CM

## 2012-01-17 ENCOUNTER — Ambulatory Visit: Payer: Medicare Other

## 2012-01-27 DIAGNOSIS — H908 Mixed conductive and sensorineural hearing loss, unspecified: Secondary | ICD-10-CM | POA: Diagnosis not present

## 2012-01-29 ENCOUNTER — Ambulatory Visit
Admission: RE | Admit: 2012-01-29 | Discharge: 2012-01-29 | Disposition: A | Discharge status: Home / Self Care | Payer: Medicare Other | Source: Ambulatory Visit | Attending: Internal Medicine | Admitting: Internal Medicine

## 2012-01-29 DIAGNOSIS — Z1231 Encounter for screening mammogram for malignant neoplasm of breast: Secondary | ICD-10-CM

## 2012-02-04 ENCOUNTER — Encounter: Payer: Self-pay | Admitting: Obstetrics and Gynecology

## 2012-03-26 DIAGNOSIS — K5790 Diverticulosis of intestine, part unspecified, without perforation or abscess without bleeding: Secondary | ICD-10-CM | POA: Insufficient documentation

## 2012-03-26 DIAGNOSIS — M4800 Spinal stenosis, site unspecified: Secondary | ICD-10-CM | POA: Insufficient documentation

## 2012-03-26 DIAGNOSIS — M1990 Unspecified osteoarthritis, unspecified site: Secondary | ICD-10-CM | POA: Insufficient documentation

## 2012-03-26 DIAGNOSIS — F329 Major depressive disorder, single episode, unspecified: Secondary | ICD-10-CM | POA: Insufficient documentation

## 2012-03-26 DIAGNOSIS — K635 Polyp of colon: Secondary | ICD-10-CM | POA: Insufficient documentation

## 2012-03-26 DIAGNOSIS — E785 Hyperlipidemia, unspecified: Secondary | ICD-10-CM | POA: Insufficient documentation

## 2012-03-26 DIAGNOSIS — G629 Polyneuropathy, unspecified: Secondary | ICD-10-CM | POA: Insufficient documentation

## 2012-03-26 DIAGNOSIS — Z8739 Personal history of other diseases of the musculoskeletal system and connective tissue: Secondary | ICD-10-CM | POA: Insufficient documentation

## 2012-03-26 DIAGNOSIS — IMO0002 Reserved for concepts with insufficient information to code with codable children: Secondary | ICD-10-CM | POA: Insufficient documentation

## 2012-03-26 DIAGNOSIS — F319 Bipolar disorder, unspecified: Secondary | ICD-10-CM | POA: Insufficient documentation

## 2012-03-26 DIAGNOSIS — E039 Hypothyroidism, unspecified: Secondary | ICD-10-CM | POA: Insufficient documentation

## 2012-03-26 DIAGNOSIS — I10 Essential (primary) hypertension: Secondary | ICD-10-CM | POA: Insufficient documentation

## 2012-03-26 DIAGNOSIS — F32A Depression, unspecified: Secondary | ICD-10-CM | POA: Insufficient documentation

## 2012-03-26 DIAGNOSIS — R7302 Impaired glucose tolerance (oral): Secondary | ICD-10-CM | POA: Insufficient documentation

## 2012-03-26 DIAGNOSIS — D649 Anemia, unspecified: Secondary | ICD-10-CM | POA: Insufficient documentation

## 2012-03-30 DIAGNOSIS — I10 Essential (primary) hypertension: Secondary | ICD-10-CM | POA: Diagnosis not present

## 2012-03-30 DIAGNOSIS — E039 Hypothyroidism, unspecified: Secondary | ICD-10-CM | POA: Diagnosis not present

## 2012-03-30 DIAGNOSIS — E785 Hyperlipidemia, unspecified: Secondary | ICD-10-CM | POA: Diagnosis not present

## 2012-04-01 ENCOUNTER — Ambulatory Visit: Payer: Medicare Other | Admitting: Obstetrics and Gynecology

## 2012-04-06 DIAGNOSIS — Z0000 Encounter for general adult medical examination without abnormal findings: Secondary | ICD-10-CM | POA: Diagnosis not present

## 2012-04-06 DIAGNOSIS — E785 Hyperlipidemia, unspecified: Secondary | ICD-10-CM | POA: Diagnosis not present

## 2012-04-06 DIAGNOSIS — Z1331 Encounter for screening for depression: Secondary | ICD-10-CM | POA: Diagnosis not present

## 2012-04-06 DIAGNOSIS — G609 Hereditary and idiopathic neuropathy, unspecified: Secondary | ICD-10-CM | POA: Diagnosis not present

## 2012-04-06 DIAGNOSIS — I10 Essential (primary) hypertension: Secondary | ICD-10-CM | POA: Diagnosis not present

## 2012-04-07 DIAGNOSIS — Z1212 Encounter for screening for malignant neoplasm of rectum: Secondary | ICD-10-CM | POA: Diagnosis not present

## 2012-05-07 DIAGNOSIS — G609 Hereditary and idiopathic neuropathy, unspecified: Secondary | ICD-10-CM | POA: Diagnosis not present

## 2012-08-04 ENCOUNTER — Emergency Department (INDEPENDENT_AMBULATORY_CARE_PROVIDER_SITE_OTHER)
Admission: EM | Admit: 2012-08-04 | Discharge: 2012-08-04 | Disposition: A | Discharge status: Home / Self Care | Payer: Medicare Other | Source: Home / Self Care

## 2012-08-04 ENCOUNTER — Encounter (HOSPITAL_COMMUNITY): Payer: Self-pay | Admitting: Emergency Medicine

## 2012-08-04 DIAGNOSIS — L723 Sebaceous cyst: Secondary | ICD-10-CM | POA: Diagnosis not present

## 2012-08-04 DIAGNOSIS — L720 Epidermal cyst: Secondary | ICD-10-CM

## 2012-08-04 DIAGNOSIS — H109 Unspecified conjunctivitis: Secondary | ICD-10-CM | POA: Diagnosis not present

## 2012-08-04 NOTE — ED Notes (Signed)
Pt c/o ? Lesion/sore spot on left leg x 3 wks. Pt states that it started out as a pimple first and gradually got worse and she use antibiotic cream with no relief. Pt states that she then boiled a knife and knicked the top of the sore to see if it would drain but it did not. Then she applied antibiotic ointment with no relief. Pt states that it has gotten much worse.

## 2012-08-04 NOTE — ED Provider Notes (Signed)
History     CSN: [id number]  Arrival date & time 08/04/12  1813   First MD Initiated Contact with Patient 08/04/12 1824      Chief Complaint  Patient presents with  . Rash    ? lesion on left thigh x 3 wks.     (Consider location/radiation/quality/duration/timing/severity/associated sxs/prior treatment) HPI Comments: 72 year old female presents with a small crusty lesions on her left mid thigh. Developed approximately 3 weeks ago. It started out as a small pustule and then it began thicken and harden forming a small 5 mm mound in the area described above. There is no sign of infection. It is mildly tender. There is no associated lymphangitis, redness or drainage.   Past Medical History  Diagnosis Date  . Diverticulosis   . Hypertension   . Glucose intolerance (impaired glucose tolerance)   . Peripheral neuropathy     bilateral feet, Dr. Sandria Manly  . Bipolar disorder   . Hyperlipidemia   . Spinal stenosis   . Cholelithiasis   . Anemia   . Colon polyps   . Hemorrhoids   . Arthritis   . DDD (degenerative disc disease)   . Depression   . Prolapsed bladder   . Tinnitus   . History of TMJ disorder   . Hypothyroidism   . Cystocele     Past Surgical History  Procedure Laterality Date  . Total knee arthroplasty      bi-lateral 2000,2007  . Abdominal hysterectomy  2002    total  . Tonsillectomy and adenoidectomy      as a child  . Tubial    . Tubal ligation      Family History  Problem Relation Age of Onset  . Bipolar disorder Mother   . Peripheral vascular disease Maternal Grandmother   . Osteoporosis Mother   . Colon cancer Neg Hx   . Colon polyps Mother   . Colon polyps Father   . Pancreatic cancer Maternal Grandmother   . Diverticulitis Sister     History  Substance Use Topics  . Smoking status: Never Smoker   . Smokeless tobacco: Never Used  . Alcohol Use: Yes     Comment: occ.    OB History   Grav Para Term Preterm Abortions TAB SAB Ect Mult Living    2 2 2       2       Review of Systems  All other systems reviewed and are negative.    Allergies  Carbamazepine and Sulfonamide derivatives  Home Medications   Current Outpatient Rx  Name  Route  Sig  Dispense  Refill  . clonazePAM (KLONOPIN) 0.5 MG tablet   Oral   Take 0.5 mg by mouth at bedtime.         . DULoxetine (CYMBALTA) 30 MG capsule   Oral   Take 30 mg by mouth. 1 tab 2 times weekly         . gabapentin (NEURONTIN) 300 MG capsule   Oral   Take 300 mg by mouth. 2 tabs tid         . lamoTRIgine (LAMICTAL) 200 MG tablet   Oral   Take 200 mg by mouth daily.         Marland Kitchen levothyroxine (SYNTHROID, LEVOTHROID) 112 MCG tablet   Oral   Take 112 mcg by mouth daily.         Marland Kitchen lisinopril (PRINIVIL,ZESTRIL) 10 MG tablet   Oral   Take 10 mg by mouth daily.  One tab 3 times a week         . Ferrous Sulfate Dried (FERROUS SULFATE CR PO)   Oral   Take by mouth daily.         Marland Kitchen MAGNESIUM PO   Oral   Take by mouth daily.         Marland Kitchen OVER THE COUNTER MEDICATION      Calcium and Vit D ? Mgs. , 2 qd           BP 127/79  Pulse 65  Temp(Src) 98.4 F (36.9 C) (Oral)  Resp 18  SpO2 98%  Physical Exam  Constitutional: She is oriented to person, place, and time. She appears well-developed and well-nourished. No distress.  Pulmonary/Chest: Effort normal.  Neurological: She is alert and oriented to person, place, and time. She exhibits normal muscle tone.  Skin: Skin is warm and dry.  5 mm annular, firm, mounded lesion in the left mid thigh. The center has a slightly pale color. No erythema or other signs of infection.  Psychiatric: She has a normal mood and affect.    ED Course  Procedures (including critical care time)  Labs Reviewed - No data to display No results found.   1. Keratin cyst       MDM  Due to the initial development of a pustule and the slightly pale center the patient agreed to at least try to place a small puncture wound  to see if the contents can be expressed. The wound was prepped with Betadine in a sterile 22-gauge needle was inserted into the middle of the lesion. There is small amount of blood was expressed however no other fluid or tissue was expressed. The lesion appears to be more solid and fluid filled. We agreed that this lesion just to be observed for the next few days and then followup with her primary care doctor. Per any changes in size, shape, color or other behaviors seek medical attention promptly from here primary care provider.        Hayden Rasmussen, NP 08/04/12 2008

## 2012-08-04 NOTE — ED Provider Notes (Signed)
Medical screening examination/treatment/procedure(s) were performed by resident physician or non-physician practitioner and as supervising physician I was immediately available for consultation/collaboration.   Barkley Bruns MD.   Linna Hoff, MD 08/04/12 2010

## 2012-10-13 DIAGNOSIS — R159 Full incontinence of feces: Secondary | ICD-10-CM | POA: Diagnosis not present

## 2012-10-13 DIAGNOSIS — E039 Hypothyroidism, unspecified: Secondary | ICD-10-CM | POA: Diagnosis not present

## 2012-10-13 DIAGNOSIS — I10 Essential (primary) hypertension: Secondary | ICD-10-CM | POA: Diagnosis not present

## 2012-10-13 DIAGNOSIS — E785 Hyperlipidemia, unspecified: Secondary | ICD-10-CM | POA: Diagnosis not present

## 2012-10-13 DIAGNOSIS — R7301 Impaired fasting glucose: Secondary | ICD-10-CM | POA: Diagnosis not present

## 2012-10-13 DIAGNOSIS — F319 Bipolar disorder, unspecified: Secondary | ICD-10-CM | POA: Diagnosis not present

## 2012-10-13 DIAGNOSIS — N812 Incomplete uterovaginal prolapse: Secondary | ICD-10-CM | POA: Diagnosis not present

## 2012-10-13 DIAGNOSIS — R198 Other specified symptoms and signs involving the digestive system and abdomen: Secondary | ICD-10-CM | POA: Diagnosis not present

## 2012-11-05 ENCOUNTER — Ambulatory Visit: Payer: Self-pay | Admitting: Neurology

## 2012-11-05 ENCOUNTER — Ambulatory Visit (INDEPENDENT_AMBULATORY_CARE_PROVIDER_SITE_OTHER): Payer: Medicare Other | Admitting: Neurology

## 2012-11-05 VITALS — BP 113/73 | HR 69 | Ht 66.0 in | Wt 149.0 lb

## 2012-11-05 DIAGNOSIS — G629 Polyneuropathy, unspecified: Secondary | ICD-10-CM

## 2012-11-05 DIAGNOSIS — G609 Hereditary and idiopathic neuropathy, unspecified: Secondary | ICD-10-CM

## 2012-11-05 DIAGNOSIS — N3946 Mixed incontinence: Secondary | ICD-10-CM | POA: Diagnosis not present

## 2012-11-05 DIAGNOSIS — N1330 Unspecified hydronephrosis: Secondary | ICD-10-CM | POA: Diagnosis not present

## 2012-11-05 MED ORDER — GABAPENTIN 300 MG PO CAPS: 600.0000 mg | ORAL_CAPSULE | Freq: Three times a day (TID) | ORAL | Status: DC

## 2012-11-05 NOTE — Progress Notes (Signed)
HPI: 72 year old right handed white married female with a  history of lower extremity dysesthesia secondary to sensory and motor polyneuropathy, initially with a predominance of demyelinating features by EMG/NCV 12/27/94 and 07/05/96.   Subsequent EMG/NCVs 02/12/00 and 05/29/06 were more consistent with an axonal "small fiber" peripheral neuropathy. There was no evidence of a lumbosacral radiculopathy.   MRI of the lumbar spine without contrast 12/08/54 showed scoliosis, multilevel spondylosis most advanced at L3-4 and L5-S1, and moderate to mild spondylosis L3-4 and mild spinal stenosis at L4-5. An osteophyte  at L5-S1 might  encroach on the extra foraminal portion of the left L5 nerve root. There was no disc herniation.   CT lumbar spine myelogram 07/01/06 tshowed multilevel DJD with disc bulges and mild spurring at L1-L2 3 moderate to advanced disc degeneration at L3-4 with spondylitic changes related  to the right lateral recess stenosis on the right and mild L4 impingement on the right with disc bulging and facet arthropathy at L4-5. Her mother, sister, father, and maternal grandfather had neuropathy.   The patient has  hammertoes in her feet. Her symptoms began with discomfort in her big toe that progressed to involve both feet with pain. Occasionally there is burning pain but she  also has a pressure-like sensation while walking. She feels as if her feet are being "squeezed". She doesn't notice the symptoms when she is active. She notices her symptoms primarily at night. She denies cramps in her feet or legs. Gabapentin has been very effective for her pain.   She also tried metanx and lyrica. She was on Cymbalta from Dr. Loralie Champagne office for a history of bipolar disease, but  tapered off. 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 Her job requires lifting, walking, and working on concrete floors. When her legs get hot, she developes a nonpruritic rash in her legs and has this for  over 20 years.  She wears "Crocs" for discomfort in her feet.She also notices cold feet. It  takes them "forever to warm up" At 4:30 PM she is  tired. She says the pain in her feet is unpredictable. She has pain 24 hours 7 days per week.   At times she has sharp pains across the top of her right foot. Her pain is worse if she misses gabapentin She has lower back and neck pain. After work she feels very stiff. She uses 3 200 mg ibuprofen for her discomfort. She has a change in her posture going  from 5 foot 8 a half inches to 5 foot 6 inches. She has bladder incontinence and rarely bowel incontinence. She denies nocturia. She denies symptoms in her hands or postural dizziness. She has not had any falls. Her  falls assessment tool score was 8 and today 16.She was followed by Dr. Noreene Filbert in 1996 who made the diagnosis. She has a family history of diabetes.  Since 07/2011 she  hears a sound on the left side of her head in the  parieto-occipital region. It is not  a swishing sound and is irregular. It can be soft or it can be lower. She notices it  mostly at night when reading or when she goes to bed. She states it s "not in ear" She has decreased hearing bilaterally and  been seen by Dr Marciano Sequin, Audiologist. She had frequent ear infections as a child. She was seen by Dr. Dorma Russell who says that her tinnitus is related to her decreased hearing.  He indicated a hearing aid would help her tinnitus.  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 She does not use a cane or walker. She has a history of lumbar spinal stenosis followed by Dr. Jeral Fruit  UPDATE Nov 05 2012: She is overall doing very well, taking gabapentin 300 mg 2 tablets 3 times a day, taking care of her grandchildren, working 2 days a week at [employer], tonight gait difficulty, some low back pain, shooting pain to her left buttock area, her depression is under good control with lamotrigine 150 mg once a day, she complains of  occasionally nighttime bilateral feet tenderness sensation, paresthesia,  Physical Exam  General:  well-developed white female  Neurologic Exam  Mental Status: Alert and oriented to time, place, and person.   Cranial Nerves: Visual fields are full to count fingers examination.  Discs flat.  Extraocular movements full.  Right ptosis. Corneals are present.  Hearing decreased, worse on the left than the right. Bone conduction greater than Air conduction AS and air conduction greater than bone conduction AD.  No facial asymmetry. Tongue midline, uvula midline, and gags present.  Sternocleidomastoid and trapezius testing normal.  Motor: 5/5 strength proximally and distally in the upper and lower extremities.   Sensory:  Length dependent decreased to pinprick to distal leg, absent told vibratory sensation, preserved toe proprioception  Gait: Narrow based and steady, she is able to walk on her heels, tiptoe, mild difficulty with tandem, Romberg is negative  Reflexes: Postural and righting reflexes are normal.  DTRs 2+ and equal except for absent  knee jerks (Has had 2 knee replacements). Ankle reflexes were present, Plantar responses   Assessment and Plan:  72 years old Caucasian female, Peripheral neuropathy, most likely inherited axonal neuropathy, bipolar  1 her neuropathy is doing very well, I have refilled her prescription of gabapentin 300 mg 2 tablets 3 times a day . 2 return to clinic as needed .

## 2012-12-03 DIAGNOSIS — N3946 Mixed incontinence: Secondary | ICD-10-CM | POA: Diagnosis not present

## 2012-12-03 DIAGNOSIS — N1330 Unspecified hydronephrosis: Secondary | ICD-10-CM | POA: Diagnosis not present

## 2012-12-10 ENCOUNTER — Other Ambulatory Visit: Payer: Self-pay

## 2012-12-10 DIAGNOSIS — R39198 Other difficulties with micturition: Secondary | ICD-10-CM | POA: Diagnosis not present

## 2012-12-10 DIAGNOSIS — N8111 Cystocele, midline: Secondary | ICD-10-CM | POA: Diagnosis not present

## 2012-12-10 MED ORDER — GABAPENTIN 600 MG PO TABS: 600.0000 mg | ORAL_TABLET | Freq: Three times a day (TID) | ORAL | Status: DC

## 2012-12-10 NOTE — Telephone Encounter (Signed)
Pharmacy sent a fax saying the patient would rather have 600mg  tabs TID instead of 300mg  2 TID.  Rx updated and resent

## 2012-12-21 ENCOUNTER — Telehealth: Payer: Self-pay | Admitting: Neurology

## 2012-12-21 NOTE — Telephone Encounter (Signed)
error 

## 2012-12-23 ENCOUNTER — Telehealth: Payer: Self-pay | Admitting: *Deleted

## 2012-12-23 NOTE — Telephone Encounter (Signed)
Calling and asking for change of MD.  Neuropathy worse. LMVM for pt to return call.

## 2013-01-12 DIAGNOSIS — M19079 Primary osteoarthritis, unspecified ankle and foot: Secondary | ICD-10-CM | POA: Diagnosis not present

## 2013-01-12 DIAGNOSIS — G609 Hereditary and idiopathic neuropathy, unspecified: Secondary | ICD-10-CM | POA: Diagnosis not present

## 2013-01-12 DIAGNOSIS — M6740 Ganglion, unspecified site: Secondary | ICD-10-CM | POA: Diagnosis not present

## 2013-01-12 DIAGNOSIS — M79609 Pain in unspecified limb: Secondary | ICD-10-CM | POA: Diagnosis not present

## 2013-03-12 DIAGNOSIS — I10 Essential (primary) hypertension: Secondary | ICD-10-CM | POA: Diagnosis not present

## 2013-03-12 DIAGNOSIS — E039 Hypothyroidism, unspecified: Secondary | ICD-10-CM | POA: Diagnosis not present

## 2013-03-12 DIAGNOSIS — D481 Neoplasm of uncertain behavior of connective and other soft tissue: Secondary | ICD-10-CM | POA: Diagnosis not present

## 2013-03-12 DIAGNOSIS — M898X9 Other specified disorders of bone, unspecified site: Secondary | ICD-10-CM | POA: Diagnosis not present

## 2013-04-02 DIAGNOSIS — I10 Essential (primary) hypertension: Secondary | ICD-10-CM | POA: Diagnosis not present

## 2013-04-02 DIAGNOSIS — D649 Anemia, unspecified: Secondary | ICD-10-CM | POA: Diagnosis not present

## 2013-04-02 DIAGNOSIS — E785 Hyperlipidemia, unspecified: Secondary | ICD-10-CM | POA: Diagnosis not present

## 2013-04-02 DIAGNOSIS — E039 Hypothyroidism, unspecified: Secondary | ICD-10-CM | POA: Diagnosis not present

## 2013-04-30 DIAGNOSIS — M898X9 Other specified disorders of bone, unspecified site: Secondary | ICD-10-CM | POA: Diagnosis not present

## 2013-04-30 DIAGNOSIS — D481 Neoplasm of uncertain behavior of connective and other soft tissue: Secondary | ICD-10-CM | POA: Diagnosis not present

## 2013-05-11 DIAGNOSIS — M25519 Pain in unspecified shoulder: Secondary | ICD-10-CM | POA: Diagnosis not present

## 2013-05-11 DIAGNOSIS — M25819 Other specified joint disorders, unspecified shoulder: Secondary | ICD-10-CM | POA: Diagnosis not present

## 2013-05-14 ENCOUNTER — Other Ambulatory Visit: Payer: Self-pay

## 2013-05-14 DIAGNOSIS — Z1231 Encounter for screening mammogram for malignant neoplasm of breast: Secondary | ICD-10-CM

## 2013-06-25 ENCOUNTER — Ambulatory Visit
Admission: RE | Admit: 2013-06-25 | Discharge: 2013-06-25 | Disposition: A | Discharge status: Home / Self Care | Payer: Medicare Other | Source: Ambulatory Visit

## 2013-06-25 DIAGNOSIS — Z1231 Encounter for screening mammogram for malignant neoplasm of breast: Secondary | ICD-10-CM

## 2013-07-06 DIAGNOSIS — Z0000 Encounter for general adult medical examination without abnormal findings: Secondary | ICD-10-CM | POA: Diagnosis not present

## 2013-07-06 DIAGNOSIS — I10 Essential (primary) hypertension: Secondary | ICD-10-CM | POA: Diagnosis not present

## 2013-07-06 DIAGNOSIS — D649 Anemia, unspecified: Secondary | ICD-10-CM | POA: Diagnosis not present

## 2013-07-06 DIAGNOSIS — E785 Hyperlipidemia, unspecified: Secondary | ICD-10-CM | POA: Diagnosis not present

## 2013-07-06 DIAGNOSIS — Z1331 Encounter for screening for depression: Secondary | ICD-10-CM | POA: Diagnosis not present

## 2013-07-06 DIAGNOSIS — F319 Bipolar disorder, unspecified: Secondary | ICD-10-CM | POA: Diagnosis not present

## 2013-07-06 DIAGNOSIS — E039 Hypothyroidism, unspecified: Secondary | ICD-10-CM | POA: Diagnosis not present

## 2013-07-06 DIAGNOSIS — E739 Lactose intolerance, unspecified: Secondary | ICD-10-CM | POA: Diagnosis not present

## 2013-07-06 DIAGNOSIS — M1990 Unspecified osteoarthritis, unspecified site: Secondary | ICD-10-CM | POA: Diagnosis not present

## 2013-07-06 DIAGNOSIS — R82998 Other abnormal findings in urine: Secondary | ICD-10-CM | POA: Diagnosis not present

## 2013-07-06 DIAGNOSIS — Z23 Encounter for immunization: Secondary | ICD-10-CM | POA: Diagnosis not present

## 2013-07-13 DIAGNOSIS — Z1212 Encounter for screening for malignant neoplasm of rectum: Secondary | ICD-10-CM | POA: Diagnosis not present

## 2013-09-14 DIAGNOSIS — H109 Unspecified conjunctivitis: Secondary | ICD-10-CM | POA: Diagnosis not present

## 2013-09-17 ENCOUNTER — Ambulatory Visit: Payer: Medicare Other | Admitting: Neurology

## 2013-10-05 ENCOUNTER — Encounter: Payer: Self-pay | Admitting: Neurology

## 2013-10-05 ENCOUNTER — Ambulatory Visit (INDEPENDENT_AMBULATORY_CARE_PROVIDER_SITE_OTHER): Payer: Medicare Other | Admitting: Neurology

## 2013-10-05 VITALS — BP 110/60 | HR 74 | Wt 151.5 lb

## 2013-10-05 DIAGNOSIS — G629 Polyneuropathy, unspecified: Secondary | ICD-10-CM

## 2013-10-05 DIAGNOSIS — G609 Hereditary and idiopathic neuropathy, unspecified: Secondary | ICD-10-CM

## 2013-10-05 DIAGNOSIS — G5600 Carpal tunnel syndrome, unspecified upper limb: Secondary | ICD-10-CM

## 2013-10-05 LAB — VITAMIN B12: Vitamin B-12: 291 pg/mL (ref 211–911)

## 2013-10-05 MED ORDER — GABAPENTIN 600 MG PO TABS: 600.0000 mg | ORAL_TABLET | Freq: Three times a day (TID) | ORAL | Status: DC

## 2013-10-05 NOTE — Progress Notes (Addendum)
[facility] Clinic Note - Initial Visit   Date: 10/05/2013    Theresa Underwood MRN: [id number] DOB: [birth year]   Dear Dr Virgina Jock:  Thank you for your kind referral of Theresa Underwood for consultation of peripheral neuropathy. Although her history is well known to you, please allow [place] to reiterate it for the purpose of our medical record. The patient was accompanied to the clinic by self.    History of Present Illness: Theresa Underwood is a 73 y.o. right-handed Caucasian female with history of hyperlipidemia, hypertension, hypothyroidism, bipolar affective disorder s/p ECT, borderline diabetes type 2, lumbar spinal stenosis, and hereditary peripheral neuropathy presenting for evaluation of ongoing painful paresthesias of the legs.  In the early 1990s, she developed gradual onset of numbness involving the tips of her toes, described as a tight sensation over the feet.  Over the years, her symptoms have progressed and now she gets numbness and prickly sensation over her lower legs (below the knee) and into her feet.  Symptoms are worse when she rests and alleviated with neurontin.  Of late, she has developed cold sensation of her feet and often puts them in a warm bath which helps.  She takes neurontin 600mg  TID (8am, 1pm, 12am) and and lamictal 150mg  (for depression).  She denies any fall and is ambulating independently.  She has intermittent pain and tinging of the hands, but denies any weakness.  Previously tried medications include metanx, Cymbalta, and Lyrica.  She also has hammertoes and history of similar symptoms involving her mother, sister, father, and maternal grandfather.  None of her family members were wheelchair-bound.  Her mother has numbness/tingling of the feet and ambulated independently until late 80s then transitioned to a cane/walker due to spinal stenosis.  She was initially under the care of Dr. Parks Neptune at [facility] who diagnosed her with hereditary  peripheral neuropathy in 1992 and was until his care until 1995 at which time he left the practice and transitioned care to Dr. Erling Cruz and since his retirement was last seen by Dr. Krista Blue in May 2014. Her last clinic note dated 11/05/2012 was reviewed and summarized as follows: Initial EMG in July 1996 and January 1998 showed sensory and motor polyneuropathy with predominantly demyelinating features. Subsequent electrodiagnostic studies in August 2001 and December 2007 was more consistent with "an axonal 'small fiber' peripheral neuropathy". There was no evidence of a lumbosacral radiculopathy.  Since 07/2011 she hears a sound on the left side of her head in the parieto-occipital region. She was seen by Dr. Thornell Mule who says that her tinnitus is related to her decreased hearing and recommended hearing aid.  Out-side paper records, electronic medical record, and images have been reviewed where available and summarized as:  Labs:  HbA1c 6.4 (10/13/2012), TSH 1.62 (03/2013), Cr 0.9 (03/2013)  MRI of the lumbar spine without contrast 12/07/2004:   Multilevel spondylosis, advanced at L3-4 L5-S1, and moderate to mild spondylosis L3-4 and mild spinal stenosis at L4-5. An osteophyte at L5-S1 might encroach the extraforaminal portion of the left L5 nerve root. There is no disc herniation.  CT myelogram 07/01/2006: Multilevel disk degeneration. Disk bulges and mild spurring at L1-2 and L2-3. Moderate to advanced disk degeneration at L3-4 with spondylotic changes, especially on the right. There is lateral recess stenosis on the right and mild L4 impingement on the right.  Disk bulging and facet arthropathy at L4-5.      Past Medical History  Diagnosis Date  . Diverticulosis   .  Hypertension   . Glucose intolerance (impaired glucose tolerance)   . Peripheral neuropathy     bilateral feet, Dr. Erling Cruz  . Bipolar disorder   . Hyperlipidemia   . Spinal stenosis   . Cholelithiasis   . Anemia   . Colon polyps   .  Hemorrhoids   . Arthritis   . DDD (degenerative disc disease)   . Depression   . Prolapsed bladder   . Tinnitus   . History of TMJ disorder   . Hypothyroidism   . Cystocele     Past Surgical History  Procedure Laterality Date  . Total knee arthroplasty      bi-lateral 2000,2007  . Abdominal hysterectomy  2002    total  . Tonsillectomy and adenoidectomy      as a child  . Tubial    . Tubal ligation       Medications:  Current Outpatient Prescriptions on File Prior to Visit  Medication Sig Dispense Refill  . clonazePAM (KLONOPIN) 0.5 MG tablet Take 0.5 mg by mouth at bedtime.      . gabapentin (NEURONTIN) 600 MG tablet Take 1 tablet (600 mg total) by mouth 3 (three) times daily.  90 tablet  12  . lamoTRIgine (LAMICTAL) 200 MG tablet Take 200 mg by mouth daily.      Marland Kitchen levothyroxine (SYNTHROID, LEVOTHROID) 112 MCG tablet Take 112 mcg by mouth daily.      Marland Kitchen lisinopril (PRINIVIL,ZESTRIL) 10 MG tablet Take 10 mg by mouth daily. One tab 3 times a week       No current facility-administered medications on file prior to visit.    Allergies:  Allergies  Allergen Reactions  . Carbamazepine     REACTION: hives  . Sulfonamide Derivatives     REACTION: hives    Family History: Family History  Problem Relation Age of Onset  . Bipolar disorder Mother   . Peripheral vascular disease Maternal Grandmother   . Osteoporosis Mother   . Colon cancer Neg Hx   . Colon polyps Mother   . Colon polyps Father   . Pancreatic cancer Maternal Grandmother   . Diverticulitis Sister   . Lung disease Father     Deceased, 32  . Stroke Mother     Deceased, 36  . Neuropathy Mother   . Neuropathy Maternal Grandfather     Social History: History   Social History  . Marital Status: Married    Spouse Name: N/A    Number of Children: 2  . Years of Education: N/A   Occupational History  . interior Electrical engineer, and retired Therapist, sports   .     Social History Main Topics  . Smoking status: Never  Smoker   . Smokeless tobacco: Never Used  . Alcohol Use: Yes     Comment: occ.  . Drug Use: No  . Sexual Activity: Not on file   Other Topics Concern  . Not on file   Social History Narrative  . No narrative on file    Review of Systems:  CONSTITUTIONAL: No fevers, chills, night sweats, or weight loss.   EYES: No visual changes or eye pain ENT: No hearing changes.  No history of nose bleeds.   RESPIRATORY: No cough, wheezing and shortness of breath.   CARDIOVASCULAR: Negative for chest pain, and palpitations.   GI: Negative for abdominal discomfort, blood in stools or black stools.  No recent change in bowel habits.   GU:  No history of incontinence.  MUSCLOSKELETAL: +history of joint pain or swelling.  No myalgias.   SKIN: Negative for lesions, rash, and itching.   HEMATOLOGY/ONCOLOGY: Negative for prolonged bleeding, bruising easily, and swollen nodes.  No history of cancer.   ENDOCRINE: Negative for cold or heat intolerance, polydipsia or goiter.   PSYCH:  No depression or anxiety symptoms.   NEURO: As Above.   Vital Signs:  BP 110/60  Pulse 74  Wt 151 lb 8 oz (68.72 kg)  SpO2 98%   General Medical Exam:   General:  Well appearing, comfortable.   Eyes/ENT: see cranial nerve examination.   Neck: No masses appreciated.  Full range of motion without tenderness.   Back:  No pain to palpation of spinous processes.   Extremities:  Bilateral hammertoes, pes cavus.  Skin:  Skin color, texture, turgor normal. No rashes or lesions.  Neurological Exam: MENTAL STATUS including orientation to time, place, person, recent and remote memory, attention span and concentration, language, and fund of knowledge is normal.  Speech is not dysarthric.  CRANIAL NERVES: II:  No visual field defects.  Unremarkable fundi.   III-IV-VI: Pupils equal round and reactive to light.  Normal conjugate, extra-ocular eye movements in all directions of gaze.  No nystagmus.  Right ptosis.   V:  Normal  facial sensation.   VII:  Normal facial symmetry and movements.    VIII:  Normal hearing and vestibular function.   IX-X:  Normal palatal movement.   XI:  Normal shoulder shrug and head rotation.   XII:  Normal tongue strength and range of motion, no deviation or fasciculation.  MOTOR:  Bilateral thenar atrophy (R >L).  No fasciculations or abnormal movements.  No pronator drift.  Tone is normal.    Right Upper Extremity:    Left Upper Extremity:    Deltoid  5/5   Deltoid  5/5   Biceps  5/5   Biceps  5/5   Triceps  5/5   Triceps  5/5   Wrist extensors  5/5   Wrist extensors  5/5   Wrist flexors  5/5   Wrist flexors  5/5   Finger extensors  5/5   Finger extensors  5/5   Finger flexors  5/5   Finger flexors  5/5   Dorsal interossei  5-/5   Dorsal interossei  5/5   Abductor pollicis  4+/5   Abductor pollicis  4+/5   Tone (Ashworth scale)  0  Tone (Ashworth scale)  0   Right Lower Extremity:    Left Lower Extremity:    Hip flexors  5/5   Hip flexors  5/5   Hip extensors  5/5   Hip extensors  5/5   Knee flexors  5/5   Knee flexors  5/5   Knee extensors  5/5   Knee extensors  5/5   Dorsiflexors  5/5   Dorsiflexors  5/5   Plantarflexors  5/5   Plantarflexors  5/5   Toe extensors  5-/5   Toe extensors  5-/5   Toe flexors  5/5   Toe flexors  5/5   Tone (Ashworth scale)  0  Tone (Ashworth scale)  0   MSRs:  Right  Left brachioradialis 2+  brachioradialis 2+  biceps 2+  biceps 2+  triceps 2+  triceps 2+  patellar 2+  patellar 2+  ankle jerk 0  ankle jerk 0  Hoffman no  Hoffman no  plantar response down  plantar response down   SENSORY:  Vibration absent at great toe bilaterally, there is hyperesthesia to pin prick distal to mid-calf bilaterally.  Proprioception is impaired at the great toe.  Temperature intact throughout.  Romberg's sign absent.   COORDINATION/GAIT: Normal finger-to- nose-finger.  Intact rapid alternating  movements bilaterally.  Able to rise from a chair without using arms.  Gait narrow based and stable. She is mildly unsteady with tandem gait.  She is able to take a few steps with stressed gait.   IMPRESSION: Mrs. Hirata is a 73 year old female presenting for evaluation of painful dysesthesias of her legs and intermittent paresthesias of her hands. Her clinical examination is consistent with a distal and symmetric large and small fiber neuropathy affecting the legs There is prominent thenar atrophy bilaterally, worse on the right side which I suspect is due to underlying CTS, more so than neuropathy.  Notable exam findings also include hammertoes and elevated arches of the feet. She most likely has hereditary neuropathy involving the legs, however it is difficult to determine if her hand symptoms are related to underlying neuropathy vs cervical stenosis vs CTS (especially with thenar wasting), and will obtain EMG of the upper extremities.  I will also screen for treatable causes for neuropathy.  She has known borderline diabetes (HbA1c 6.2) which she is controlling with life style modification.  From a symptomatic standpoint, she is happy with her current regimen of neurontin.   PLAN/RECOMMENDATIONS:  1.  Check vitamin B12, copper, SPEP/UPEP with IFE 2.  EMG of the arms (R >L) 3.  Continue neurontin 600mg  three times daily 4.  Return to clinic 6-weeks    The duration of this appointment visit was 45 minutes of face-to-face time with the patient.  Greater than 50% of this time was spent in counseling, explanation of diagnosis, planning of further management, and coordination of care.   Thank you for allowing me to participate in patient's care.  If I can answer any additional questions, I would be pleased to do so.    Sincerely,    Donika K. Posey Pronto, DO

## 2013-10-05 NOTE — Progress Notes (Signed)
Note faxed.

## 2013-10-05 NOTE — Patient Instructions (Signed)
1.  Check vitamin B12, copper, SPEP/UPEP with IFE 2.  EMG of the arms (R >L) 3.  Continue neurontin 600mg  three times daily 4.  Return to clinic in 6-weeks

## 2013-10-07 LAB — UIFE/LIGHT CHAINS/TP QN, 24-HR UR
ALBUMIN, U: DETECTED
Free Kappa Lt Chains,Ur: 0.53 mg/dL (ref 0.14–2.42)
Free Kappa/Lambda Ratio: 8.83 ratio (ref 2.04–10.37)
Free Lambda Lt Chains,Ur: 0.06 mg/dL (ref 0.02–0.67)
Total Protein, Urine: 1.1 mg/dL

## 2013-10-07 LAB — COPPER, SERUM: Copper: 139 ug/dL (ref 70–175)

## 2013-10-11 LAB — SPEP & IFE WITH QIG
ALPHA-1-GLOBULIN: 3.3 % (ref 2.9–4.9)
Albumin ELP: 59.9 % (ref 55.8–66.1)
Alpha-2-Globulin: 8.5 % (ref 7.1–11.8)
Beta 2: 6.2 % (ref 3.2–6.5)
Beta Globulin: 5.7 % (ref 4.7–7.2)
GAMMA GLOBULIN: 16.4 % (ref 11.1–18.8)
IGA: 396 mg/dL — AB (ref 69–380)
IGG (IMMUNOGLOBIN G), SERUM: 1330 mg/dL (ref 690–1700)
IGM, SERUM: 72 mg/dL (ref 52–322)
Total Protein, Serum Electrophoresis: 7.6 g/dL (ref 6.0–8.3)

## 2013-10-13 ENCOUNTER — Encounter: Payer: Self-pay | Admitting: *Deleted

## 2013-10-18 ENCOUNTER — Telehealth: Payer: Self-pay | Admitting: Neurology

## 2013-10-18 NOTE — Telephone Encounter (Signed)
Left message for patient to call me back. 

## 2013-10-18 NOTE — Telephone Encounter (Signed)
Pt called wanting to f/u on the EMG study she is suppose to have. She is also requesting it to have it done on a Friday if possible since that is her day off.

## 2013-10-18 NOTE — Telephone Encounter (Signed)
Patient needs to schedule EMG.

## 2013-10-22 ENCOUNTER — Other Ambulatory Visit: Payer: Self-pay | Admitting: *Deleted

## 2013-10-22 DIAGNOSIS — G629 Polyneuropathy, unspecified: Secondary | ICD-10-CM

## 2013-10-25 ENCOUNTER — Encounter: Payer: Self-pay | Admitting: Neurology

## 2013-10-25 ENCOUNTER — Ambulatory Visit (INDEPENDENT_AMBULATORY_CARE_PROVIDER_SITE_OTHER): Payer: Medicare Other | Admitting: Neurology

## 2013-10-25 DIAGNOSIS — G609 Hereditary and idiopathic neuropathy, unspecified: Secondary | ICD-10-CM

## 2013-10-25 DIAGNOSIS — G629 Polyneuropathy, unspecified: Secondary | ICD-10-CM

## 2013-10-25 DIAGNOSIS — M5412 Radiculopathy, cervical region: Secondary | ICD-10-CM | POA: Insufficient documentation

## 2013-10-25 NOTE — Progress Notes (Signed)
See procedure note for EMG results.  Donika K. Patel, DO  

## 2013-10-25 NOTE — Procedures (Signed)
[facility]  [address]  [place], [place] [address] Tel: [phone] Fax:  [phone] Test Date:  10/25/2013  Patient: Theresa Underwood DOB: [birth year] Physician: Narda Amber, DO  Sex: Female Height: 5\' 6"  Ref Phys: Narda Amber  ID#: [id number] Temp: 34.0C Technician: Laureen Ochs R. NCS T.   Patient Complaints: Patient is a 73 year old female with bilateral hand paresthesias right side greater than left.  NCV & EMG Findings: Extensive electrodiagnostic testing of the right and left upper extremities reveals: 1. Normal median, ulnar, radial, and palmar studies bilaterally. 2. Normal median and ulnar motor responses bilaterally. 3. In the right side, chronic motor axonal loss changes is seen affecting the abductor pollicis brevis, biceps, and deltoid muscles without accompanied active denervation.  In the left upper extremity, similar findings are seen in the abductor pollicis brevis, pronator teres, and triceps muscles.  Impression: 1. There is electrophysiological evidence of a multilevel cervical radiculopathy affecting the right C5, left C7, and bilateral T1 nerve roots/segment, overall mild-to-moderate degree electrically. 2. There is no evidence generalized sensorimotor polyneuropathy or carpal tunnel syndrome affecting the upper extremities.    ___________________________ Narda Amber, DO    Nerve Conduction Studies Anti Sensory Summary Table   Site NR Peak (ms) Norm Peak (ms) P-T Amp (V) Norm P-T Amp  Left Median Anti Sensory (2nd Digit)  34C  Wrist    3.1 <3.8 28.5 >10  Right Median Anti Sensory (2nd Digit)  Wrist    3.4 <3.8 32.6 >10  Left Radial Anti Sensory (Base 1st Digit)  34C  Wrist    2.2 <2.8 40.4 >10  Right Radial Anti Sensory (Base 1st Digit)  Wrist    2.3 <2.8 24.8 >10  Left Ulnar Anti Sensory (5th Digit)  34C  Wrist    2.7 <3.2 29.2 >5  Right Ulnar Anti Sensory (5th Digit)  Wrist    2.7 <3.2 27.6 >5   Motor Summary  Table   Site NR Onset (ms) Norm Onset (ms) O-P Amp (mV) Norm O-P Amp Site1 Site2 Delta-0 (ms) Dist (cm) Vel (m/s) Norm Vel (m/s)  Left Median Motor (Abd Poll Brev)  34C  Wrist    3.0 <4.0 7.2 >5 Elbow Wrist 4.4 23.5 53 >50  Elbow    7.4  6.3         Right Median Motor (Abd Poll Brev)  Wrist    3.0 <4.0 8.9 >5 Elbow Wrist 4.5 24.0 53 >50  Elbow    7.5  8.4         Left Ulnar Motor (Abd Dig Minimi)  34C  Wrist    2.3 <3.1 7.0 >7 B Elbow Wrist 3.9 21.5 55 >50  B Elbow    6.2  6.9  A Elbow B Elbow 1.8 10.0 56 >50  A Elbow    8.0  6.7         Right Ulnar Motor (Abd Dig Minimi)  Wrist    2.2 <3.1 9.7 >7 B Elbow Wrist 3.9 22.5 58 >50  B Elbow    6.1  9.0  A Elbow B Elbow 1.7 10.0 59 >50  A Elbow    7.8  8.2          Comparison Summary Table   Site NR Peak (ms) Norm Peak (ms) P-T Amp (V) Site1 Site2 Delta-P (ms) Norm Delta (ms)  Left Median/Ulnar Palm Comparison (Wrist - 8cm)  34C  Median Palm    2.0 <2.2 57.5 Median  Palm Ulnar Palm 0.2   Ulnar Palm    1.8 <2.2 29.5      Right Median/Ulnar Palm Comparison (Wrist - 8cm)  Median Palm    2.2 <2.2 53.1 Median Palm Ulnar Palm 0.3   Ulnar Palm    1.9 <2.2 26.8       EMG   Side Muscle Ins Act Fibs Psw Fasc Number Recrt Dur Dur. Amp Amp. Poly Poly. Comment  Right 1stDorInt Nml Nml Nml Nml Nml Nml Nml Nml Nml Nml Nml Nml N/A  Right Ext Indicis Nml Nml Nml Nml Nml Nml Nml Nml Nml Nml Nml Nml N/A  Right FlexPolLong Nml Nml Nml Nml Nml Nml Nml Nml Nml Nml Nml Nml N/A  Right Abd Poll Brev Nml Nml Nml Nml 2- Rapid Some 1+ Some 1+ Few 1+ N/A  Right PronatorTeres Nml Nml Nml Nml Nml Nml Nml Nml Nml Nml Nml Nml N/A  Right Biceps Nml Nml Nml Nml 1- Mod-R Few 1+ Few 1+ Nml Nml N/A  Right Triceps Nml Nml Nml Nml Nml Nml Nml Nml Nml Nml Nml Nml N/A  Right Deltoid Nml Nml Nml Nml 1- Rapid Some 1+ Few 1+ Some 1+ N/A  Left 1stDorInt Nml Nml Nml Nml Nml Nml Nml Nml Nml Nml Nml Nml N/A  Left Abd Poll Brev Nml Nml Nml Nml 1- Rapid Some 1+ Few 1+ Few 1+ N/A   Left Ext Indicis Nml Nml Nml Nml Nml Nml Nml Nml Nml Nml Nml Nml N/A  Left Biceps Nml Nml Nml Nml Nml Nml Nml Nml Nml Nml Nml Nml N/A  Left Triceps Nml Nml Nml Nml 1- Mod-R Some 1+ Nml Nml Nml Nml N/A  Left PronatorTeres Nml Nml Nml Nml 1- Mod-R Few 1+ Nml Nml Nml Nml N/A  Left Deltoid Nml Nml Nml Nml Nml Nml Nml Nml Nml Nml Nml Nml N/A      Waveforms:

## 2013-10-29 ENCOUNTER — Encounter: Payer: Self-pay | Admitting: Neurology

## 2013-10-29 ENCOUNTER — Ambulatory Visit (INDEPENDENT_AMBULATORY_CARE_PROVIDER_SITE_OTHER): Payer: Medicare Other | Admitting: Neurology

## 2013-10-29 VITALS — BP 122/70 | HR 71 | Temp 97.8°F | Ht 66.0 in | Wt 152.2 lb

## 2013-10-29 DIAGNOSIS — E538 Deficiency of other specified B group vitamins: Secondary | ICD-10-CM

## 2013-10-29 DIAGNOSIS — M5412 Radiculopathy, cervical region: Secondary | ICD-10-CM

## 2013-10-29 DIAGNOSIS — G629 Polyneuropathy, unspecified: Secondary | ICD-10-CM

## 2013-10-29 DIAGNOSIS — G609 Hereditary and idiopathic neuropathy, unspecified: Secondary | ICD-10-CM

## 2013-10-29 DIAGNOSIS — H02409 Unspecified ptosis of unspecified eyelid: Secondary | ICD-10-CM | POA: Diagnosis not present

## 2013-10-29 NOTE — Progress Notes (Signed)
Follow-up Visit   Date: 10/29/2013    Theresa Underwood MRN: [id number] DOB: 1941-02-07   Interim History: Theresa Underwood is a 73 y.o. right-handed Caucasian female with history of hyperlipidemia, hypertension, hypothyroidism, bipolar affective disorder s/p ECT, borderline diabetes type 2, lumbar spinal stenosis, and hereditary peripheral neuropathy  returning to the clinic for follow-up of painful paresthesias.  The patient was accompanied to the clinic by self.  History of present illness: In the early 1990s, she developed gradual onset of numbness involving the tips of her toes, described as a tight sensation over the feet. Over the years, her symptoms have progressed and now she gets numbness and prickly sensation over her lower legs (below the knee) and into her feet. Symptoms are worse when she rests and alleviated with neurontin. Of late, she has developed cold sensation of her feet and often puts them in a warm bath which helps. She takes neurontin 600mg  TID (8am, 1pm, 12am) and and lamictal 150mg  (for depression). She denies any fall and is ambulating independently. She has intermittent pain and tinging of the hands, but denies any weakness. Previously tried medications include metanx, Cymbalta, and Lyrica. She also has hammertoes and history of similar symptoms involving her mother, sister, father, and maternal grandfather. None of her family members were wheelchair-bound. Her mother has numbness/tingling of the feet and ambulated independently until late 80s then transitioned to a cane/walker due to spinal stenosis.   She was initially under the care of Dr. Parks Neptune at [facility] who diagnosed her with hereditary peripheral neuropathy in 1992 and was until his care until 1995 at which time he left the practice and transitioned care to Dr. Erling Cruz and since his retirement was last seen by Dr. Krista Blue in May 2014. Her last clinic note dated 11/05/2012 was reviewed and summarized as follows:  Initial  EMG in July 1996 and January 1998 showed sensory and motor polyneuropathy with predominantly demyelinating features. Subsequent electrodiagnostic studies in August 2001 and December 2007 was more consistent with "an axonal 'small fiber' peripheral neuropathy". There was no evidence of a lumbosacral radiculopathy.   - Follow-up 10/29/2013:  She presents to discuss results of her lab tests and EMG.  Her recent labs indicated low-normal B12 of 291 for which she started supplements.  EMG of the arms shows cervical radiculopathy affecting right C5, left C7, and bilateral T1 nerve roots.  There is no evidence of neuropathy or CTS.  Her neuropathic pain is well-controlled neurontin 600mg  TID.      Medications:  Current Outpatient Prescriptions on File Prior to Visit  Medication Sig Dispense Refill  . clonazePAM (KLONOPIN) 0.5 MG tablet Take 0.5 mg by mouth at bedtime.      . gabapentin (NEURONTIN) 600 MG tablet Take 1 tablet (600 mg total) by mouth 3 (three) times daily.  90 tablet  11  . ibuprofen (ADVIL,MOTRIN) 200 MG tablet Take 200 mg by mouth every 6 (six) hours as needed.      . lamoTRIgine (LAMICTAL) 200 MG tablet Take 200 mg by mouth daily.      Marland Kitchen levothyroxine (SYNTHROID, LEVOTHROID) 112 MCG tablet Take 112 mcg by mouth daily.      Marland Kitchen lisinopril (PRINIVIL,ZESTRIL) 10 MG tablet Take 10 mg by mouth daily. One tab 3 times a week       No current facility-administered medications on file prior to visit.    Allergies:  Allergies  Allergen Reactions  . Carbamazepine     REACTION: hives  .  Sulfonamide Derivatives     REACTION: hives     Review of Systems:  CONSTITUTIONAL: No fevers, chills, night sweats, or weight loss.   EYES: No visual changes or eye pain ENT: No hearing changes.  No history of nose bleeds.   RESPIRATORY: No cough, wheezing and shortness of breath.   CARDIOVASCULAR: Negative for chest pain, and palpitations.   GI: Negative for abdominal discomfort, blood in stools or  black stools.  No recent change in bowel habits.   GU:  No history of incontinence.   MUSCLOSKELETAL: +history of joint pain or swelling.  No myalgias.   SKIN: Negative for lesions, rash, and itching.   ENDOCRINE: Negative for cold or heat intolerance, polydipsia or goiter.   PSYCH:  No depression or anxiety symptoms.   NEURO: As Above.   Vital Signs:  BP 122/70  Pulse 71  Temp(Src) 97.8 F (36.6 C) (Oral)  Ht 5\' 6"  (1.676 m)  Wt 152 lb 3.2 oz (69.037 kg)  BMI 24.58 kg/m2  SpO2 98%  Neurological Exam: MENTAL STATUS including orientation to time, place, person, recent and remote memory, attention span and concentration, language, and fund of knowledge is normal.  Speech is not dysarthric.  CRANIAL NERVES: Pupils equal round and reactive to light.  Normal conjugate, extra-ocular eye movements in all directions of gaze.  No ptosis. Normal facial sensation.  Face is symmetric. Palate elevates symmetrically.  Tongue is midline.  MOTOR:  Motor strength is 5/5 in all extremities, except interosseus muscles 5-/5, ABP 4+/5, and toe extensors 5-/5 bilaterally.  Bilateral mild thenar atrophy, no fasciculations or abnormal movements.  No pronator drift.  Tone is normal.    MSRs:  Reflexes are 2+/4 throughout, except absent Achilles bilaterally.  SENSORY:  Vibration absent at great toe bilaterally, there is hyperesthesia to pin prick distal to mid-calf bilaterally. Proprioception is impaired at the great toe. Temperature intact throughout. Romberg's sign absent  COORDINATION/GAIT:  Gait narrow based and stable.   Data: EMG 10/25/2013: There is electrophysiological evidence of a multilevel cervical radiculopathy affecting the right C5, left C7, and bilateral T1 nerve roots/segment, overall mild-to-moderate degree electrically. There is no evidence generalized sensorimotor polyneuropathy or carpal tunnel syndrome affecting the upper extremities.  Labs 10/05/2013:  Vitamin B12 291*, SPEP/UPEP with  IFE - no M protein, copper 139  Labs: HbA1c 6.4 (10/13/2012), TSH 1.62 (03/2013), Cr 0.9 (03/2013)   MRI of the lumbar spine without contrast 12/07/2004: Multilevel spondylosis, advanced at L3-4 L5-S1, and moderate to mild spondylosis L3-4 and mild spinal stenosis at L4-5. An osteophyte at L5-S1 might encroach the extraforaminal portion of the left L5 nerve root. There is no disc herniation.   CT myelogram 07/01/2006:  Multilevel disk degeneration. Disk bulges and mild spurring at L1-2 and L2-3. Moderate to advanced disk degeneration at L3-4 with spondylotic changes, especially on the right. There is lateral recess stenosis on the right and mild L4 impingement on the right.  Disk bulging and facet arthropathy at L4-5.     IMPRESSION/PLAN: 1.  Peripheral neuropathy affecting bilateral lower extremities, mild   - Strong family history of neuropathy, likely hereditary (CMT1A?)  - Neuropathy labs unrevealing except B12 is low-normal   - Clinically stable with no involvement of the upper extremities  - Continue neurontin 600mg  TID 2.  Multilevel cervical radiculopathy  - Most likely contributing to her upper extremity paresthesias  - Given her symptoms are mild and she is reassured that it is not her progression of neuropathy, she  would like to be followed clinically 3.  Low vitamin B12  - Continue vitamin B12 1000 mcg supplements daily  - Recheck vitamin B12 at her next follow-up 4.  Foot pain from cyst, being followed by podiatry 5.  Return to clinic in 6 months    The duration of this appointment visit was 25 minutes of face-to-face time with the patient.  Greater than 50% of this time was spent in counseling, explanation of diagnosis, planning of further management, and coordination of care.   Thank you for allowing me to participate in patient's care.  If I can answer any additional questions, I would be pleased to do so.    Sincerely,    Effie Wahlert K. Posey Pronto, DO

## 2013-10-29 NOTE — Patient Instructions (Addendum)
1.  Continue neurontin 600mg  three times daily 2.  Continue vitamin B12 1052mcg daily 3.  Return to clinic in 26-months

## 2013-11-04 ENCOUNTER — Encounter: Payer: Self-pay | Admitting: Neurology

## 2013-12-13 ENCOUNTER — Encounter: Payer: Medicare Other | Admitting: Neurology

## 2013-12-23 ENCOUNTER — Encounter: Payer: Medicare Other | Admitting: Neurology

## 2013-12-24 DIAGNOSIS — F319 Bipolar disorder, unspecified: Secondary | ICD-10-CM | POA: Diagnosis not present

## 2014-01-05 DIAGNOSIS — E039 Hypothyroidism, unspecified: Secondary | ICD-10-CM | POA: Diagnosis not present

## 2014-01-05 DIAGNOSIS — Z1331 Encounter for screening for depression: Secondary | ICD-10-CM | POA: Diagnosis not present

## 2014-01-05 DIAGNOSIS — R7301 Impaired fasting glucose: Secondary | ICD-10-CM | POA: Diagnosis not present

## 2014-01-05 DIAGNOSIS — E538 Deficiency of other specified B group vitamins: Secondary | ICD-10-CM | POA: Diagnosis not present

## 2014-01-05 DIAGNOSIS — I10 Essential (primary) hypertension: Secondary | ICD-10-CM | POA: Diagnosis not present

## 2014-01-05 DIAGNOSIS — G609 Hereditary and idiopathic neuropathy, unspecified: Secondary | ICD-10-CM | POA: Diagnosis not present

## 2014-01-05 DIAGNOSIS — IMO0002 Reserved for concepts with insufficient information to code with codable children: Secondary | ICD-10-CM | POA: Diagnosis not present

## 2014-01-14 DIAGNOSIS — F319 Bipolar disorder, unspecified: Secondary | ICD-10-CM | POA: Diagnosis not present

## 2014-02-04 DIAGNOSIS — F319 Bipolar disorder, unspecified: Secondary | ICD-10-CM | POA: Diagnosis not present

## 2014-02-08 DIAGNOSIS — M25819 Other specified joint disorders, unspecified shoulder: Secondary | ICD-10-CM | POA: Diagnosis not present

## 2014-03-11 DIAGNOSIS — F319 Bipolar disorder, unspecified: Secondary | ICD-10-CM | POA: Diagnosis not present

## 2014-03-14 ENCOUNTER — Encounter: Payer: Self-pay | Admitting: Internal Medicine

## 2014-04-01 DIAGNOSIS — M79671 Pain in right foot: Secondary | ICD-10-CM | POA: Diagnosis not present

## 2014-04-08 DIAGNOSIS — F319 Bipolar disorder, unspecified: Secondary | ICD-10-CM | POA: Diagnosis not present

## 2014-04-18 ENCOUNTER — Encounter: Payer: Self-pay | Admitting: Neurology

## 2014-05-06 ENCOUNTER — Ambulatory Visit (INDEPENDENT_AMBULATORY_CARE_PROVIDER_SITE_OTHER): Payer: Medicare Other | Admitting: Neurology

## 2014-05-06 ENCOUNTER — Encounter: Payer: Self-pay | Admitting: Neurology

## 2014-05-06 VITALS — BP 120/64 | HR 76 | Ht 65.0 in | Wt 152.0 lb

## 2014-05-06 DIAGNOSIS — M5412 Radiculopathy, cervical region: Secondary | ICD-10-CM | POA: Diagnosis not present

## 2014-05-06 DIAGNOSIS — E538 Deficiency of other specified B group vitamins: Secondary | ICD-10-CM | POA: Diagnosis not present

## 2014-05-06 DIAGNOSIS — G629 Polyneuropathy, unspecified: Secondary | ICD-10-CM

## 2014-05-06 DIAGNOSIS — R7989 Other specified abnormal findings of blood chemistry: Secondary | ICD-10-CM

## 2014-05-06 DIAGNOSIS — G609 Hereditary and idiopathic neuropathy, unspecified: Secondary | ICD-10-CM | POA: Diagnosis not present

## 2014-05-06 DIAGNOSIS — F319 Bipolar disorder, unspecified: Secondary | ICD-10-CM | POA: Diagnosis not present

## 2014-05-06 NOTE — Progress Notes (Signed)
Follow-up Visit   Date: 05/06/2014    Theresa Underwood MRN: [id number] DOB: [birth year]   Interim History: Theresa Underwood is a 73 y.o. right-handed Caucasian female with history of hyperlipidemia, hypertension, hypothyroidism, bipolar affective disorder s/p ECT, borderline diabetes type 2, lumbar spinal stenosis, and hereditary peripheral neuropathy  returning to the clinic for follow-up of painful paresthesias.  The patient was accompanied to the clinic by self.  History of present illness: In the early 1990s, she developed gradual onset of numbness involving the tips of her toes, described as a tight sensation over the feet. Over the years, her symptoms have progressed and now she gets numbness and prickly sensation over her lower legs (below the knee) and into her feet. Symptoms are worse when she rests and alleviated with neurontin. Of late, she has developed cold sensation of her feet and often puts them in a warm bath which helps. She takes neurontin 600mg  TID (8am, 1pm, 12am) and and lamictal 150mg  (for depression). She denies any fall and is ambulating independently. She has intermittent pain and tinging of the hands, but denies any weakness. Previously tried medications include metanx, Cymbalta, and Lyrica. She also has hammertoes and history of similar symptoms involving her mother, sister, father, and maternal grandfather. None of her family members were wheelchair-bound. Her mother has numbness/tingling of the feet and ambulated independently until late 80s then transitioned to a cane/walker due to spinal stenosis.   She was initially under the care of Dr. Parks Neptune at [facility] who diagnosed her with hereditary peripheral neuropathy in 1992 and was until his care until 1995 at which time he left the practice and transitioned care to Dr. Erling Cruz and since his retirement was last seen by Dr. Krista Blue in May 2014. Her last clinic note dated 11/05/2012 was reviewed and summarized as follows:  Initial  EMG in July 1996 and January 1998 showed sensory and motor polyneuropathy with predominantly demyelinating features. Subsequent electrodiagnostic studies in August 2001 and December 2007 was more consistent with "an axonal 'small fiber' peripheral neuropathy". There was no evidence of a lumbosacral radiculopathy.   - Follow-up 10/29/2013:  Her recent labs indicated low-normal B12 of 291 for which she started supplements.  EMG of the arms shows cervical radiculopathy affecting right C5, left C7, and bilateral T1 nerve roots.  There is no evidence of neuropathy or CTS.  Her neuropathic pain is well-controlled neurontin 600mg  TID.    - UPDATE 05/06/2014:  She is doing well and denies any new or worsening symptoms.  She did have one fall when trying to carry her grandson and hit the side of her sofa.  No interval hospitalizations.  She was taking B12 supplements but it made her symptoms worse so she stopped it.  She tried taking a lower dose and has been tolerating it.   Medications:  Current Outpatient Prescriptions on File Prior to Visit  Medication Sig Dispense Refill  . clonazePAM (KLONOPIN) 0.5 MG tablet Take 0.5 mg by mouth at bedtime.    . gabapentin (NEURONTIN) 600 MG tablet Take 1 tablet (600 mg total) by mouth 3 (three) times daily. 90 tablet 11  . ibuprofen (ADVIL,MOTRIN) 200 MG tablet Take 200 mg by mouth every 6 (six) hours as needed.    . lamoTRIgine (LAMICTAL) 200 MG tablet Take 200 mg by mouth daily.    Marland Kitchen levothyroxine (SYNTHROID, LEVOTHROID) 112 MCG tablet Take 112 mcg by mouth daily.    Marland Kitchen lisinopril (PRINIVIL,ZESTRIL) 10 MG tablet Take 10  mg by mouth daily. One tab 3 times a week     No current facility-administered medications on file prior to visit.    Allergies:  Allergies  Allergen Reactions  . Carbamazepine     REACTION: hives  . Sulfonamide Derivatives     REACTION: hives     Review of Systems:  CONSTITUTIONAL: No fevers, chills, night sweats, or weight loss.     EYES: No visual changes or eye pain ENT: No hearing changes.  No history of nose bleeds.   RESPIRATORY: No cough, wheezing and shortness of breath.   CARDIOVASCULAR: Negative for chest pain, and palpitations.   GI: Negative for abdominal discomfort, blood in stools or black stools.  No recent change in bowel habits.   GU:  No history of incontinence.   MUSCLOSKELETAL: +history of joint pain or swelling.  No myalgias.   SKIN: Negative for lesions, rash, and itching.   ENDOCRINE: Negative for cold or heat intolerance, polydipsia or goiter.   PSYCH:  No depression or anxiety symptoms.   NEURO: As Above.   Vital Signs:  BP 120/64 mmHg  Pulse 76  Ht 5\' 5"  (1.651 m)  Wt 152 lb (68.947 kg)  BMI 25.29 kg/m2  SpO2 98%  Neurological Exam: MENTAL STATUS including orientation to time, place, person, recent and remote memory, attention span and concentration, language, and fund of knowledge is normal.  Speech is not dysarthric.  CRANIAL NERVES:  Pupils equal round and reactive to light.  Normal conjugate, extra-ocular eye movements in all directions of gaze.  Subtle right ptosis.Face is symmetric. Palate elevates symmetrically.  Tongue is midline.  MOTOR:  Motor strength is 5/5 in all extremities, except interosseus muscles 5-/5, ABP 4+/5, and toe extensors 5-/5 bilaterally.  Bilateral mild thenar atrophy.  Tone is normal.    MSRs:  Reflexes are 2+/4 throughout, except absent Achilles bilaterally.  SENSORY:  Vibration absent at great toe bilaterally, there is hyperesthesia to pin prick distal to mid-calf bilaterally.  COORDINATION/GAIT:  Gait narrow based and stable. Unsteady with tandem gait.  Data: EMG 10/25/2013:  There is electrophysiological evidence of a multilevel cervical radiculopathy affecting the right C5, left C7, and bilateral T1 nerve roots/segment, overall mild-to-moderate degree electrically. There is no evidence generalized sensorimotor polyneuropathy or carpal tunnel syndrome  affecting the upper extremities.  Labs 10/05/2013:  Vitamin B12 291*, SPEP/UPEP with IFE - no M protein, copper 139  Labs: HbA1c 6.4 (10/13/2012), TSH 1.62 (03/2013), Cr 0.9 (03/2013)   MRI of the lumbar spine without contrast 12/07/2004: Multilevel spondylosis, advanced at L3-4 L5-S1, and moderate to mild spondylosis L3-4 and mild spinal stenosis at L4-5. An osteophyte at L5-S1 might encroach the extraforaminal portion of the left L5 nerve root. There is no disc herniation.   CT myelogram 07/01/2006:  Multilevel disk degeneration. Disk bulges and mild spurring at L1-2 and L2-3. Moderate to advanced disk degeneration at L3-4 with spondylotic changes, especially on the right. There is lateral recess stenosis on the right and mild L4 impingement on the right.  Disk bulging and facet arthropathy at L4-5.     IMPRESSION/PLAN: 1.  Peripheral neuropathy affecting bilateral lower extremities, mild   - Strong family history of neuropathy, likely hereditary (CMT1A?)  - Neuropathy labs unrevealing except B12 is low-normal   - Clinically stable with no involvement of the upper extremities  - Continue neurontin 600mg  TID 2.  Multilevel cervical radiculopathy  - Most likely contributing to her upper extremity paresthesias 3.  Low vitamin B12  -  Continue vitamin B12 400 mcg supplements daily  - Check vitamin B12  4. Return to clinic in 6-12 months   The duration of this appointment visit was 20 minutes of face-to-face time with the patient.  Greater than 50% of this time was spent in counseling, explanation of diagnosis, planning of further management, and coordination of care.   Thank you for allowing me to participate in patient's care.  If I can answer any additional questions, I would be pleased to do so.    Sincerely,    Torina Ey K. Posey Pronto, DO

## 2014-05-06 NOTE — Patient Instructions (Addendum)
Check vitamin B12 level today Return to clinic 2-months

## 2014-05-07 LAB — VITAMIN B12: VITAMIN B 12: 775 pg/mL (ref 211–911)

## 2014-06-03 DIAGNOSIS — F319 Bipolar disorder, unspecified: Secondary | ICD-10-CM | POA: Diagnosis not present

## 2014-07-07 DIAGNOSIS — E039 Hypothyroidism, unspecified: Secondary | ICD-10-CM | POA: Diagnosis not present

## 2014-07-07 DIAGNOSIS — I10 Essential (primary) hypertension: Secondary | ICD-10-CM | POA: Diagnosis not present

## 2014-07-07 DIAGNOSIS — Z008 Encounter for other general examination: Secondary | ICD-10-CM | POA: Diagnosis not present

## 2014-07-07 DIAGNOSIS — E785 Hyperlipidemia, unspecified: Secondary | ICD-10-CM | POA: Diagnosis not present

## 2014-07-07 DIAGNOSIS — E7439 Other disorders of intestinal carbohydrate absorption: Secondary | ICD-10-CM | POA: Diagnosis not present

## 2014-07-07 DIAGNOSIS — E538 Deficiency of other specified B group vitamins: Secondary | ICD-10-CM | POA: Diagnosis not present

## 2014-07-11 ENCOUNTER — Other Ambulatory Visit: Payer: Self-pay

## 2014-07-11 DIAGNOSIS — Z1231 Encounter for screening mammogram for malignant neoplasm of breast: Secondary | ICD-10-CM

## 2014-07-14 DIAGNOSIS — E538 Deficiency of other specified B group vitamins: Secondary | ICD-10-CM | POA: Diagnosis not present

## 2014-07-14 DIAGNOSIS — M1990 Unspecified osteoarthritis, unspecified site: Secondary | ICD-10-CM | POA: Diagnosis not present

## 2014-07-14 DIAGNOSIS — E7439 Other disorders of intestinal carbohydrate absorption: Secondary | ICD-10-CM | POA: Diagnosis not present

## 2014-07-14 DIAGNOSIS — Z0000 Encounter for general adult medical examination without abnormal findings: Secondary | ICD-10-CM | POA: Diagnosis not present

## 2014-07-14 DIAGNOSIS — R198 Other specified symptoms and signs involving the digestive system and abdomen: Secondary | ICD-10-CM | POA: Diagnosis not present

## 2014-07-14 DIAGNOSIS — J309 Allergic rhinitis, unspecified: Secondary | ICD-10-CM | POA: Diagnosis not present

## 2014-07-14 DIAGNOSIS — K5790 Diverticulosis of intestine, part unspecified, without perforation or abscess without bleeding: Secondary | ICD-10-CM | POA: Diagnosis not present

## 2014-07-14 DIAGNOSIS — F319 Bipolar disorder, unspecified: Secondary | ICD-10-CM | POA: Diagnosis not present

## 2014-07-14 DIAGNOSIS — G629 Polyneuropathy, unspecified: Secondary | ICD-10-CM | POA: Diagnosis not present

## 2014-07-14 DIAGNOSIS — N812 Incomplete uterovaginal prolapse: Secondary | ICD-10-CM | POA: Diagnosis not present

## 2014-07-14 DIAGNOSIS — D649 Anemia, unspecified: Secondary | ICD-10-CM | POA: Diagnosis not present

## 2014-07-14 DIAGNOSIS — Z6824 Body mass index (BMI) 24.0-24.9, adult: Secondary | ICD-10-CM | POA: Diagnosis not present

## 2014-07-15 DIAGNOSIS — F319 Bipolar disorder, unspecified: Secondary | ICD-10-CM | POA: Diagnosis not present

## 2014-07-18 DIAGNOSIS — Z1212 Encounter for screening for malignant neoplasm of rectum: Secondary | ICD-10-CM | POA: Diagnosis not present

## 2014-07-22 ENCOUNTER — Ambulatory Visit
Admission: RE | Admit: 2014-07-22 | Discharge: 2014-07-22 | Disposition: A | Discharge status: Home / Self Care | Payer: Medicare Other | Source: Ambulatory Visit

## 2014-07-22 DIAGNOSIS — Z1231 Encounter for screening mammogram for malignant neoplasm of breast: Secondary | ICD-10-CM | POA: Diagnosis not present

## 2014-07-26 DIAGNOSIS — Z973 Presence of spectacles and contact lenses: Secondary | ICD-10-CM | POA: Diagnosis not present

## 2014-07-26 DIAGNOSIS — H02401 Unspecified ptosis of right eyelid: Secondary | ICD-10-CM | POA: Diagnosis not present

## 2014-07-26 DIAGNOSIS — H2513 Age-related nuclear cataract, bilateral: Secondary | ICD-10-CM | POA: Diagnosis not present

## 2014-07-26 DIAGNOSIS — H11442 Conjunctival cysts, left eye: Secondary | ICD-10-CM | POA: Diagnosis not present

## 2014-08-12 DIAGNOSIS — F319 Bipolar disorder, unspecified: Secondary | ICD-10-CM | POA: Diagnosis not present

## 2014-08-25 DIAGNOSIS — J209 Acute bronchitis, unspecified: Secondary | ICD-10-CM | POA: Diagnosis not present

## 2014-08-25 DIAGNOSIS — I10 Essential (primary) hypertension: Secondary | ICD-10-CM | POA: Diagnosis not present

## 2014-08-25 DIAGNOSIS — J309 Allergic rhinitis, unspecified: Secondary | ICD-10-CM | POA: Diagnosis not present

## 2014-08-25 DIAGNOSIS — Z6824 Body mass index (BMI) 24.0-24.9, adult: Secondary | ICD-10-CM | POA: Diagnosis not present

## 2014-08-25 DIAGNOSIS — R05 Cough: Secondary | ICD-10-CM | POA: Diagnosis not present

## 2014-10-12 ENCOUNTER — Other Ambulatory Visit: Payer: Self-pay | Admitting: Neurology

## 2014-10-12 NOTE — Telephone Encounter (Signed)
Rx sent 

## 2014-11-01 DIAGNOSIS — H1013 Acute atopic conjunctivitis, bilateral: Secondary | ICD-10-CM | POA: Diagnosis not present

## 2014-11-10 ENCOUNTER — Ambulatory Visit (INDEPENDENT_AMBULATORY_CARE_PROVIDER_SITE_OTHER): Payer: Medicare Other | Admitting: Neurology

## 2014-11-10 ENCOUNTER — Encounter: Payer: Self-pay | Admitting: Neurology

## 2014-11-10 VITALS — BP 108/64 | HR 75 | Ht 65.0 in | Wt 149.1 lb

## 2014-11-10 DIAGNOSIS — G3184 Mild cognitive impairment, so stated: Secondary | ICD-10-CM

## 2014-11-10 DIAGNOSIS — G629 Polyneuropathy, unspecified: Secondary | ICD-10-CM | POA: Diagnosis not present

## 2014-11-10 DIAGNOSIS — E538 Deficiency of other specified B group vitamins: Secondary | ICD-10-CM

## 2014-11-10 NOTE — Progress Notes (Signed)
Follow-up Visit   Date: 11/10/2014    Theresa Underwood MRN: [id number] DOB: 06/25/1940   Interim History: Theresa Underwood is a 74 y.o. right-handed Caucasian female with history of hyperlipidemia, hypertension, hypothyroidism, bipolar affective disorder s/p ECT, borderline diabetes type 2, lumbar spinal stenosis, and hereditary peripheral neuropathy  returning to the clinic for follow-up of painful paresthesias.  The patient was accompanied to the clinic by self.  History of present illness: In the early 1990s, she developed gradual onset of numbness involving the tips of her toes, described as a tight sensation over the feet. Over the years, her symptoms have progressed and now she gets numbness and prickly sensation over her lower legs (below the knee) and into her feet. Symptoms are worse when she rests and alleviated with neurontin. Of late, she has developed cold sensation of her feet and often puts them in a warm bath which helps. She takes neurontin 600mg  TID (8am, 1pm, 12am) and and lamictal 150mg  (for depression). She denies any fall and is ambulating independently. She has intermittent pain and tinging of the hands, but denies any weakness. Previously tried medications include metanx, Cymbalta, and Lyrica. She also has hammertoes and history of similar symptoms involving her mother, sister, father, and maternal grandfather. None of her family members were wheelchair-bound. Her mother has numbness/tingling of the feet and ambulated independently until late 80s then transitioned to a cane/walker due to spinal stenosis.   She was initially under the care of Dr. Parks Neptune at [facility] who diagnosed her with hereditary peripheral neuropathy in 1992 and was until his care until 1995 at which time he left the practice and transitioned care to Dr. Erling Cruz and since his retirement was last seen by Dr. Krista Blue in May 2014. Her last clinic note dated 11/05/2012 was reviewed and summarized as follows:  Initial  EMG in July 1996 and January 1998 showed sensory and motor polyneuropathy with predominantly demyelinating features. Subsequent electrodiagnostic studies in August 2001 and December 2007 was more consistent with "an axonal 'small fiber' peripheral neuropathy". There was no evidence of a lumbosacral radiculopathy.   - Follow-up 10/29/2013:  Her recent labs indicated low-normal B12 of 291 for which she started supplements.  EMG of the arms shows cervical radiculopathy affecting right C5, left C7, and bilateral T1 nerve roots.  There is no evidence of neuropathy or CTS.  Her neuropathic pain is well-controlled neurontin 600mg  TID.    - UPDATE 05/06/2014:  She is doing well and denies any new or worsening symptoms.  She did have one fall when trying to carry her grandson and hit the side of her sofa.  No interval hospitalizations.  She was taking B12 supplements but it made her symptoms worse so she stopped it.  She tried taking a lower dose and has been tolerating it.  - UDPATE 11/10/2014:  She reports her balance is a little worse, but she has not had any falls and continues to walk independently.  She works part-time at a [employer] so stay active.  She is having achy pain left knee, but denies any associated numbness/tingling of the knee area.  She had a knee replacement several years ago.     Her new complaints today is with her memory, especially with short-term recall, such as trying to remember a telephone number.  She is very active and does her own IADLs. No wording finding problems, but has been told that she repeats herself.  She has  a history of bipolar for which she takes lamictal.  Denies any depressive symptoms.    Medications:  Current Outpatient Prescriptions on File Prior to Visit  Medication Sig Dispense Refill  . clonazePAM (KLONOPIN) 0.5 MG tablet Take 0.5 mg by mouth at bedtime.    . gabapentin (NEURONTIN) 600 MG tablet take 1 tablet by mouth three times  a day 90 tablet 11  . ibuprofen (ADVIL,MOTRIN) 200 MG tablet Take 200 mg by mouth every 6 (six) hours as needed.    . lamoTRIgine (LAMICTAL) 200 MG tablet Take 200 mg by mouth daily.    Marland Kitchen levothyroxine (SYNTHROID, LEVOTHROID) 112 MCG tablet Take 112 mcg by mouth daily.    Marland Kitchen lisinopril (PRINIVIL,ZESTRIL) 10 MG tablet Take 10 mg by mouth daily. One tab 3 times a week    . VOLTAREN 1 % GEL   0   No current facility-administered medications on file prior to visit.    Allergies:  Allergies  Allergen Reactions  . Carbamazepine     REACTION: hives  . Sulfonamide Derivatives     REACTION: hives     Review of Systems:  CONSTITUTIONAL: No fevers, chills, night sweats, or weight loss.   EYES: No visual changes or eye pain ENT: No hearing changes.  No history of nose bleeds.   RESPIRATORY: No cough, wheezing and shortness of breath.   CARDIOVASCULAR: Negative for chest pain, and palpitations.   GI: Negative for abdominal discomfort, blood in stools or black stools.  No recent change in bowel habits.   GU:  No history of incontinence.   MUSCLOSKELETAL: +history of joint pain or swelling.  No myalgias.   SKIN: Negative for lesions, rash, and itching.   ENDOCRINE: Negative for cold or heat intolerance, polydipsia or goiter.   PSYCH:  No depression or anxiety symptoms.   NEURO: As Above.   Vital Signs:  BP 108/64 mmHg  Pulse 75  Ht 5\' 5"  (1.651 m)  Wt 149 lb 1 oz (67.614 kg)  BMI 24.81 kg/m2  SpO2 96%  Neurological Exam: Montreal Cognitive Assessment  11/10/2014  Visuospatial/ Executive (0/5) 5  Naming (0/3) 2  Attention: Read list of digits (0/2) 2  Attention: Read list of letters (0/1) 1  Attention: Serial 7 subtraction starting at 100 (0/3) 3  Language: Repeat phrase (0/2) 2  Language : Fluency (0/1) 0  Abstraction (0/2) 2  Delayed Recall (0/5) 4  Orientation (0/6) 6  Total 27   MENTAL STATUS including orientation to time, place, person, recent and remote memory, attention  span and concentration, language, and fund of knowledge is normal.  Speech is not dysarthric.    CRANIAL NERVES:  Pupils equal round and reactive to light.  Normal conjugate, extra-ocular eye movements in all directions of gaze.  Subtle right ptosis. Face is symmetric. Palate elevates symmetrically.  Tongue is midline.  MOTOR:  Motor strength is 5/5 in all extremities, except interosseus muscles 5-/5, ABP 4+/5, and toe extensors 5-/5 bilaterally.  Tone is normal.    MSRs:  Reflexes are 2+/4 throughout, except absent Achilles bilaterally.  SENSORY:  Vibration absent at great toe bilaterally, there is hyperesthesia to pin prick distal to mid-calf bilaterally.  COORDINATION/GAIT:  Gait narrow based and stable. Mildly unsteady with tandem gait.  Data: EMG 10/25/2013:  There is electrophysiological evidence of a multilevel cervical radiculopathy affecting the right C5, left C7, and bilateral T1 nerve roots/segment, overall mild-to-moderate degree electrically. There is no evidence generalized sensorimotor polyneuropathy or carpal tunnel syndrome  affecting the upper extremities.  Labs 10/05/2013:  Vitamin B12 291*, SPEP/UPEP with IFE - no M protein, copper 139  Lab Results  Component Value Date   [id number] 403 05/06/2014   Labs: HbA1c 6.4 (10/13/2012), TSH 1.62 (03/2013), Cr 0.9 (03/2013)   MRI of the lumbar spine without contrast 12/07/2004: Multilevel spondylosis, advanced at L3-4 L5-S1, and moderate to mild spondylosis L3-4 and mild spinal stenosis at L4-5. An osteophyte at L5-S1 might encroach the extraforaminal portion of the left L5 nerve root. There is no disc herniation.   CT myelogram 07/01/2006:  Multilevel disk degeneration. Disk bulges and mild spurring at L1-2 and L2-3. Moderate to advanced disk degeneration at L3-4 with spondylotic changes, especially on the right. There is lateral recess stenosis on the right and mild L4 impingement on the right.  Disk bulging and facet arthropathy at  L4-5.     IMPRESSION/PLAN: 1.  Peripheral neuropathy affecting bilateral lower extremities, mild   - Strong family history of neuropathy, likely hereditary (CMT1A?)  - Neuropathy labs unrevealing except B12 is low-normal   - Clinically stable with no involvement of the upper extremities  - Continue neurontin 600mg  TID  2.  Mild cognitive impairment, early signs of dementia cannot be excluded - new problem  - MOCA 27/30  - Neuropsychiatry testing deferred   3.  Low vitamin B12  - Continue vitamin B12 400 mcg supplements daily  4. Bipolar disorder, on lamictal   Return to clinic in 1 year or sooner as needed   The duration of this appointment visit was 35 minutes of face-to-face time with the patient.  Greater than 50% of this time was spent in counseling, explanation of diagnosis, planning of further management, and coordination of care.   Thank you for allowing me to participate in patient's care.  If I can answer any additional questions, I would be pleased to do so.    Sincerely,    Donika K. Posey Pronto, DO

## 2014-11-27 DIAGNOSIS — R300 Dysuria: Secondary | ICD-10-CM | POA: Diagnosis not present

## 2014-11-27 DIAGNOSIS — K6289 Other specified diseases of anus and rectum: Secondary | ICD-10-CM | POA: Diagnosis not present

## 2014-11-27 DIAGNOSIS — N9089 Other specified noninflammatory disorders of vulva and perineum: Secondary | ICD-10-CM | POA: Diagnosis not present

## 2014-11-28 ENCOUNTER — Telehealth: Payer: Self-pay | Admitting: Internal Medicine

## 2014-11-28 NOTE — Telephone Encounter (Signed)
Pt states she has been having issues with frequent BM's. States that every time she sits to pee she has a BM also. States this happens every 4-5 mths. Pt also states she has been having abdominal pain.Pt is concerned and would like to be seen. Pt scheduled to see Dr. Henrene Pastor tomorrow at [facility]. Pt aware of appt.

## 2014-11-29 ENCOUNTER — Other Ambulatory Visit (INDEPENDENT_AMBULATORY_CARE_PROVIDER_SITE_OTHER): Payer: Medicare Other

## 2014-11-29 ENCOUNTER — Ambulatory Visit (INDEPENDENT_AMBULATORY_CARE_PROVIDER_SITE_OTHER): Payer: Medicare Other | Admitting: Internal Medicine

## 2014-11-29 ENCOUNTER — Encounter: Payer: Self-pay | Admitting: Internal Medicine

## 2014-11-29 VITALS — BP 108/70 | HR 64 | Ht 64.75 in | Wt 151.2 lb

## 2014-11-29 DIAGNOSIS — R194 Change in bowel habit: Secondary | ICD-10-CM

## 2014-11-29 DIAGNOSIS — R198 Other specified symptoms and signs involving the digestive system and abdomen: Secondary | ICD-10-CM | POA: Diagnosis not present

## 2014-11-29 DIAGNOSIS — K5730 Diverticulosis of large intestine without perforation or abscess without bleeding: Secondary | ICD-10-CM

## 2014-11-29 LAB — IGA: IGA: 351 mg/dL (ref 68–378)

## 2014-11-29 NOTE — Patient Instructions (Signed)
Your physician has requested that you go to the basement for the following lab work before leaving today:  TTG, IGA  As discussed with Dr. Henrene Pastor, you may take probiotics daily, as well as a fiber supplement   Take Imodium for your diarrhea

## 2014-11-29 NOTE — Progress Notes (Signed)
HISTORY OF PRESENT ILLNESS:  Theresa Underwood is a 74 y.o. female with multiple medical problems as listed below who presents herself today for evaluation of chronic change in bowel habits. The patient was last evaluated early 2013 regarding normocytic anemia and dyspepsia. In February 2013 she underwent complete colonoscopy and upper endoscopy. Upper endoscopy was normal. Duodenal biopsies were negative colonoscopy revealed moderate left-sided diverticulosis but was otherwise negative. Patient reports today that she has had a three-year history of intermittent increased frequency of bowel movements. No diarrhea. During these "episodes" she will have the need to defecate when urinating. Symptoms are infrequent. Has not had problems for 4 months. Some bloating abdominal discomfort but no true pain. No bleeding or weight loss. No fevers. She denies new medications or change in diet. She does feel that her symptoms might be better with yogurt  REVIEW OF SYSTEMS:  All non-GI ROS negative except for depression, arthritis, back pain, hearing problems,  Past Medical History  Diagnosis Date  . Diverticulosis   . Hypertension   . Glucose intolerance (impaired glucose tolerance)   . Peripheral neuropathy     bilateral feet, Dr. Erling Cruz  . Bipolar disorder   . Hyperlipidemia   . Spinal stenosis   . Cholelithiasis   . Anemia   . Colon polyps   . Hemorrhoids   . Arthritis   . DDD (degenerative disc disease)   . Depression   . Prolapsed bladder   . Tinnitus   . History of TMJ disorder   . Hypothyroidism   . Cystocele     Past Surgical History  Procedure Laterality Date  . Total knee arthroplasty      bi-lateral 2000,2007  . Abdominal hysterectomy  2002    total  . Tonsillectomy and adenoidectomy      as a child  . Tubial    . Tubal ligation      Social History Theresa Underwood  reports that she has never smoked. She has never used smokeless tobacco. She reports that she drinks alcohol.  She reports that she does not use illicit drugs.  family history includes Bipolar disorder in her mother; Colon polyps in her father and mother; Diverticulitis in her sister; Lung disease in her father; Neuropathy in her maternal grandfather and mother; Osteoporosis in her mother; Pancreatic cancer in her maternal grandmother; Peripheral vascular disease in her maternal grandmother; Stroke in her mother. There is no history of Colon cancer.  Allergies  Allergen Reactions  . Carbamazepine     REACTION: hives  . Sulfonamide Derivatives     REACTION: hives       PHYSICAL EXAMINATION: Vital signs: BP 108/70 mmHg  Pulse 64  Ht 5' 4.75" (1.645 m)  Wt 151 lb 4 oz (68.607 kg)  BMI 25.35 kg/m2 General: Well-developed, well-nourished, no acute distress HEENT: Sclerae are anicteric, conjunctiva pink. Oral mucosa intact Lungs: Clear Heart: Regular Abdomen: soft, nontender, nondistended, no obvious ascites, no peritoneal signs, normal bowel sounds. No organomegaly. Extremities: No edema Psychiatric: alert and oriented x3. Cooperative   ASSESSMENT:  #1. Chronic stable nonspecific change in bowel habits as described. No alarm features. Colonoscopy 3 years ago with diverticulosis. Normal ileum. Could be related to dietary indiscretion intermittently. Possibly spasm related to known diverticular disease   PLAN:  #1. Screen for celiac disease with tissue transglutaminase antibody and serum IgA level. we will contact patient with results #2. Reassurance and parameters to call should alarm features develop (true diarrhea, bleeding, pain, weight loss,  etc.) #3. Trial of probiotic okay during "episodes" #4. Frequency of bowel movements or increased affect quality-of-life, then a trial of Imodium as recommended during "episodes" #5. Routine colonoscopy in 2023

## 2014-11-30 LAB — TISSUE TRANSGLUTAMINASE, IGA: Tissue Transglutaminase Ab, IgA: 1 U/mL (ref <4)

## 2015-01-05 DIAGNOSIS — H1011 Acute atopic conjunctivitis, right eye: Secondary | ICD-10-CM | POA: Diagnosis not present

## 2015-01-12 DIAGNOSIS — H1011 Acute atopic conjunctivitis, right eye: Secondary | ICD-10-CM | POA: Diagnosis not present

## 2015-01-19 DIAGNOSIS — N3946 Mixed incontinence: Secondary | ICD-10-CM | POA: Diagnosis not present

## 2015-01-19 DIAGNOSIS — N8111 Cystocele, midline: Secondary | ICD-10-CM | POA: Diagnosis not present

## 2015-02-23 DIAGNOSIS — N8111 Cystocele, midline: Secondary | ICD-10-CM | POA: Diagnosis not present

## 2015-02-23 DIAGNOSIS — N3946 Mixed incontinence: Secondary | ICD-10-CM | POA: Diagnosis not present

## 2015-03-18 DIAGNOSIS — F319 Bipolar disorder, unspecified: Secondary | ICD-10-CM | POA: Diagnosis not present

## 2015-03-18 DIAGNOSIS — I10 Essential (primary) hypertension: Secondary | ICD-10-CM | POA: Diagnosis not present

## 2015-03-18 DIAGNOSIS — N3000 Acute cystitis without hematuria: Secondary | ICD-10-CM | POA: Diagnosis not present

## 2015-03-18 DIAGNOSIS — Z79899 Other long term (current) drug therapy: Secondary | ICD-10-CM | POA: Diagnosis not present

## 2015-04-05 DIAGNOSIS — N8110 Cystocele, unspecified: Secondary | ICD-10-CM | POA: Diagnosis not present

## 2015-04-05 DIAGNOSIS — Z6826 Body mass index (BMI) 26.0-26.9, adult: Secondary | ICD-10-CM | POA: Diagnosis not present

## 2015-04-05 DIAGNOSIS — Z01411 Encounter for gynecological examination (general) (routine) with abnormal findings: Secondary | ICD-10-CM | POA: Diagnosis not present

## 2015-04-05 DIAGNOSIS — N390 Urinary tract infection, site not specified: Secondary | ICD-10-CM | POA: Diagnosis not present

## 2015-07-11 DIAGNOSIS — R7302 Impaired glucose tolerance (oral): Secondary | ICD-10-CM | POA: Diagnosis not present

## 2015-07-11 DIAGNOSIS — I10 Essential (primary) hypertension: Secondary | ICD-10-CM | POA: Diagnosis not present

## 2015-07-11 DIAGNOSIS — E038 Other specified hypothyroidism: Secondary | ICD-10-CM | POA: Diagnosis not present

## 2015-07-11 DIAGNOSIS — E7439 Other disorders of intestinal carbohydrate absorption: Secondary | ICD-10-CM | POA: Diagnosis not present

## 2015-07-11 DIAGNOSIS — E784 Other hyperlipidemia: Secondary | ICD-10-CM | POA: Diagnosis not present

## 2015-07-11 DIAGNOSIS — E538 Deficiency of other specified B group vitamins: Secondary | ICD-10-CM | POA: Diagnosis not present

## 2015-07-20 DIAGNOSIS — L821 Other seborrheic keratosis: Secondary | ICD-10-CM | POA: Diagnosis not present

## 2015-07-27 ENCOUNTER — Other Ambulatory Visit: Payer: Self-pay

## 2015-07-27 DIAGNOSIS — Z1231 Encounter for screening mammogram for malignant neoplasm of breast: Secondary | ICD-10-CM

## 2015-07-31 ENCOUNTER — Emergency Department (INDEPENDENT_AMBULATORY_CARE_PROVIDER_SITE_OTHER)
Admission: EM | Admit: 2015-07-31 | Discharge: 2015-07-31 | Disposition: A | Discharge status: Home / Self Care | Payer: Medicare Other | Source: Home / Self Care | Attending: Emergency Medicine | Admitting: Emergency Medicine

## 2015-07-31 ENCOUNTER — Encounter (HOSPITAL_COMMUNITY): Payer: Self-pay | Admitting: *Deleted

## 2015-07-31 ENCOUNTER — Other Ambulatory Visit (HOSPITAL_COMMUNITY)
Admission: RE | Admit: 2015-07-31 | Discharge: 2015-07-31 | Disposition: A | Discharge status: Home / Self Care | Payer: Medicare Other | Source: Ambulatory Visit | Attending: Emergency Medicine | Admitting: Emergency Medicine

## 2015-07-31 DIAGNOSIS — N390 Urinary tract infection, site not specified: Secondary | ICD-10-CM | POA: Insufficient documentation

## 2015-07-31 LAB — POCT URINALYSIS DIP (DEVICE)
Bilirubin Urine: NEGATIVE
Glucose, UA: NEGATIVE mg/dL
Hgb urine dipstick: NEGATIVE
Ketones, ur: NEGATIVE mg/dL
Leukocytes, UA: NEGATIVE
Nitrite: NEGATIVE
PH: 7 (ref 5.0–8.0)
PROTEIN: NEGATIVE mg/dL
Specific Gravity, Urine: 1.01 (ref 1.005–1.030)
Urobilinogen, UA: 0.2 mg/dL (ref 0.0–1.0)

## 2015-07-31 MED ORDER — CIPROFLOXACIN HCL 500 MG PO TABS: 500.0000 mg | ORAL_TABLET | Freq: Two times a day (BID) | ORAL | Status: DC

## 2015-07-31 NOTE — Discharge Instructions (Signed)
It sounds like you to have a urinary tract infection. Take Cipro twice a day for the next week. I have sent your urine for culture. We will call you if we need to change antibiotics. Follow-up as needed.

## 2015-07-31 NOTE — ED Provider Notes (Signed)
CSN: [id number]     Arrival date & time 07/31/15  1714 History   First MD Initiated Contact with Patient 07/31/15 1948     Chief Complaint  Patient presents with  . Urinary Tract Infection   (Consider location/radiation/quality/duration/timing/severity/associated sxs/prior Treatment) HPI She is a 75 year old woman here for evaluation of urinary pressure. She does have a history of bladder prolapse. She has been seen by Dr. Vikki Ports, who has recommended surgery. She would like to put off surgery until her grandchildren are little older. This morning, she developed pressure in the bladder area. She also reports increased frequency and some urgency. She denies frank dysuria, but states urinating is not quite normal. No abdominal pain or flank pain. No fevers or chills. No vaginal discharge or itching.  Past Medical History  Diagnosis Date  . Diverticulosis   . Hypertension   . Glucose intolerance (impaired glucose tolerance)   . Peripheral neuropathy (HCC)     bilateral feet, Dr. Erling Cruz  . Bipolar disorder ([place])   . Hyperlipidemia   . Spinal stenosis   . Cholelithiasis   . Anemia   . Colon polyps   . Hemorrhoids   . Arthritis   . DDD (degenerative disc disease)   . Depression   . Prolapsed bladder   . Tinnitus   . History of TMJ disorder   . Hypothyroidism   . Cystocele    Past Surgical History  Procedure Laterality Date  . Total knee arthroplasty      bi-lateral 2000,2007  . Abdominal hysterectomy  2002    total  . Tonsillectomy and adenoidectomy      as a child  . Tubial    . Tubal ligation     Family History  Problem Relation Age of Onset  . Bipolar disorder Mother   . Peripheral vascular disease Maternal Grandmother   . Osteoporosis Mother   . Colon cancer Neg Hx   . Colon polyps Mother   . Colon polyps Father   . Pancreatic cancer Maternal Grandmother   . Diverticulitis Sister   . Lung disease Father     Deceased, 46  . Stroke Mother     Deceased, 39  .  Neuropathy Mother   . Neuropathy Maternal Grandfather    Social History  Substance Use Topics  . Smoking status: Never Smoker   . Smokeless tobacco: Never Used  . Alcohol Use: 0.0 oz/week    0 Standard drinks or equivalent per week     Comment: occ.   OB History    Gravida Para Term Preterm AB TAB SAB Ectopic Multiple Living   2 2 2       2      Review of Systems As in history of present illness Allergies  Carbamazepine and Sulfonamide derivatives  Home Medications   Prior to Admission medications   Medication Sig Start Date End Date Taking? Authorizing Provider  ciprofloxacin (CIPRO) 500 MG tablet Take 1 tablet (500 mg total) by mouth 2 (two) times daily. 07/31/15   Melony Overly, MD  clonazePAM (KLONOPIN) 0.5 MG tablet Take 0.5 mg by mouth at bedtime.    Historical Provider, MD  gabapentin (NEURONTIN) 600 MG tablet take 1 tablet by mouth three times a day 10/12/14   Donika K Patel, DO  ibuprofen (ADVIL,MOTRIN) 200 MG tablet Take 200 mg by mouth every 6 (six) hours as needed.    Historical Provider, MD  lamoTRIgine (LAMICTAL) 200 MG tablet Take 200 mg by mouth daily.  Historical Provider, MD  levothyroxine (SYNTHROID, LEVOTHROID) 112 MCG tablet Take 112 mcg by mouth daily.    Historical Provider, MD  lisinopril (PRINIVIL,ZESTRIL) 10 MG tablet Take 10 mg by mouth daily. One tab 3 times a week    Historical Provider, MD  VOLTAREN 1 % GEL  04/04/14   Historical Provider, MD   Meds Ordered and Administered this Visit  Medications - No data to display  BP 152/90 mmHg  Pulse 82  Temp(Src) 98.6 F (37 C) (Oral)  Resp 18  SpO2 100% No data found.   Physical Exam  Constitutional: She is oriented to person, place, and time. She appears well-developed and well-nourished. No distress.  Cardiovascular: Normal rate.   Pulmonary/Chest: Effort normal.  Abdominal:  No CVA tenderness  Neurological: She is alert and oriented to person, place, and time.    ED Course  Procedures  (including critical care time)  Labs Review Labs Reviewed  URINE CULTURE  POCT URINALYSIS DIP (DEVICE)    Imaging Review No results found.    MDM   1. UTI (lower urinary tract infection)    Treatment presumptively with Cipro. Urine sent for culture. Follow-up as needed.    Melony Overly, MD 07/31/15 2036

## 2015-07-31 NOTE — ED Notes (Signed)
Pt     Reports     Symptoms  Of   Urinary   Frequency   A=nd  Discomfort   With  Burning  On  Urination      And  Vaginal  Irritation  She  states  She  Has  Problem  With  A   Possible  Near  Prolapsed   Bladder          symptoms  Since this  Am

## 2015-08-01 DIAGNOSIS — Z1212 Encounter for screening for malignant neoplasm of rectum: Secondary | ICD-10-CM | POA: Diagnosis not present

## 2015-08-03 LAB — URINE CULTURE

## 2015-08-08 ENCOUNTER — Telehealth (HOSPITAL_COMMUNITY): Payer: Self-pay | Admitting: Emergency Medicine

## 2015-08-08 NOTE — ED Notes (Signed)
x1 attempt LM on pt's VM Need to give lab results from recent visit on 2/13  Per Dr. Valere Dross,  Urine culture from 07/31/15 growing >100k cfu enterococcus sensitive to cipro. Pt received rx for cipro at [facility] visit 07/31/15.  No further treatment at this time. Recheck or followup pcp/Theresa Underwood for persistent symptoms. LM  Will try later.

## 2015-08-14 NOTE — ED Notes (Unsigned)
x2 attempt LM on pt's VM Need to give lab results from recent visit on 2/13  Per Dr. Valere Dross,  Urine culture from 07/31/15 growing >100k cfu enterococcus sensitive to cipro. Pt received rx for cipro at [facility] visit 07/31/15.  No further treatment at this time. Recheck or followup pcp/John Virgina Jock for persistent symptoms. LM  Mailed letter as x3 attempt

## 2015-08-17 ENCOUNTER — Ambulatory Visit: Payer: No Typology Code available for payment source

## 2015-09-07 ENCOUNTER — Ambulatory Visit
Admission: RE | Admit: 2015-09-07 | Discharge: 2015-09-07 | Disposition: A | Discharge status: Home / Self Care | Payer: Medicare Other | Source: Ambulatory Visit

## 2015-09-07 DIAGNOSIS — Z1231 Encounter for screening mammogram for malignant neoplasm of breast: Secondary | ICD-10-CM | POA: Diagnosis not present

## 2015-09-19 DIAGNOSIS — R05 Cough: Secondary | ICD-10-CM | POA: Diagnosis not present

## 2015-09-19 DIAGNOSIS — I10 Essential (primary) hypertension: Secondary | ICD-10-CM | POA: Diagnosis not present

## 2015-09-19 DIAGNOSIS — J029 Acute pharyngitis, unspecified: Secondary | ICD-10-CM | POA: Diagnosis not present

## 2015-09-19 DIAGNOSIS — Z6824 Body mass index (BMI) 24.0-24.9, adult: Secondary | ICD-10-CM | POA: Diagnosis not present

## 2015-09-19 DIAGNOSIS — J0100 Acute maxillary sinusitis, unspecified: Secondary | ICD-10-CM | POA: Diagnosis not present

## 2015-09-19 DIAGNOSIS — R509 Fever, unspecified: Secondary | ICD-10-CM | POA: Diagnosis not present

## 2015-09-28 DIAGNOSIS — N3946 Mixed incontinence: Secondary | ICD-10-CM | POA: Diagnosis not present

## 2015-09-28 DIAGNOSIS — Z0000 Encounter for general adult medical examination without abnormal findings: Secondary | ICD-10-CM | POA: Diagnosis not present

## 2015-09-28 DIAGNOSIS — N8111 Cystocele, midline: Secondary | ICD-10-CM | POA: Diagnosis not present

## 2015-10-02 ENCOUNTER — Other Ambulatory Visit: Payer: Self-pay | Admitting: Urology

## 2015-10-08 ENCOUNTER — Other Ambulatory Visit: Payer: Self-pay | Admitting: Neurology

## 2015-10-26 DIAGNOSIS — R300 Dysuria: Secondary | ICD-10-CM | POA: Diagnosis not present

## 2015-11-09 ENCOUNTER — Ambulatory Visit (INDEPENDENT_AMBULATORY_CARE_PROVIDER_SITE_OTHER): Payer: Medicare Other | Admitting: Neurology

## 2015-11-09 ENCOUNTER — Encounter: Payer: Self-pay | Admitting: Neurology

## 2015-11-09 VITALS — BP 118/64 | HR 78 | Ht 64.75 in | Wt 148.1 lb

## 2015-11-09 DIAGNOSIS — G629 Polyneuropathy, unspecified: Secondary | ICD-10-CM

## 2015-11-09 MED ORDER — GABAPENTIN 600 MG PO TABS: 600.0000 mg | ORAL_TABLET | Freq: Three times a day (TID) | ORAL | Status: DC

## 2015-11-09 NOTE — Progress Notes (Signed)
Follow-up Visit   Date: 11/09/2015    Theresa Underwood MRN: [id number] DOB: 03-Dec-1940   Interim History: Theresa Underwood is a 75 y.o. right-handed Caucasian female with history of hyperlipidemia, hypertension, hypothyroidism, bipolar affective disorder s/p ECT, borderline diabetes type 2, lumbar spinal stenosis, and hereditary peripheral neuropathy  returning to the clinic for follow-up of painful paresthesias.  The patient was accompanied to the clinic by self.  History of present illness: In the early 1990s, she developed gradual onset of numbness involving the tips of her toes, described as a tight sensation over the feet. Over the years, her symptoms have progressed and now she gets numbness and prickly sensation over her lower legs (below the knee) and into her feet. Symptoms are worse when she rests and alleviated with neurontin. Of late, she has developed cold sensation of her feet and often puts them in a warm bath which helps. She takes neurontin 600mg  TID (8am, 1pm, 12am) and and lamictal 150mg  (for depression). She denies any fall and is ambulating independently. She has intermittent pain and tinging of the hands, but denies any weakness. Previously tried medications include metanx, Cymbalta, and Lyrica. She also has hammertoes and history of similar symptoms involving her mother, sister, father, and maternal grandfather. None of her family members were wheelchair-bound. Her mother has numbness/tingling of the feet and ambulated independently until late 80s then transitioned to a cane/walker due to spinal stenosis.   She was initially under the care of Dr. Parks Underwood at [facility] who diagnosed her with hereditary peripheral neuropathy in 1992 and was until his care until 1995 at which time he left the practice and transitioned care to Dr. Erling Underwood and since his retirement was last seen by Dr. Krista Underwood in May 2014. Her last clinic note dated 11/05/2012 was reviewed and summarized as follows:  Initial  EMG in July 1996 and January 1998 showed sensory and motor polyneuropathy with predominantly demyelinating features. Subsequent electrodiagnostic studies in August 2001 and December 2007 was more consistent with "an axonal 'small fiber' peripheral neuropathy". There was no evidence of a lumbosacral radiculopathy.   EMG of the arms shows cervical radiculopathy affecting right C5, left C7, and bilateral T1 nerve roots.  There is no evidence of neuropathy or CTS.  Her neuropathic pain is well-controlled neurontin 600mg  TID.    - UDPATE 11/10/2014:  She reports her balance is a little worse, but she has not had any falls and continues to walk independently.  She works part-time at a [employer] so stay active.  She is having achy pain left knee, but denies any associated numbness/tingling of the knee area.  She had a knee replacement several years ago.     Her new complaints is with her memory, especially with short-term recall, such as trying to remember a telephone number.  She is very active and does her own IADLs. No wording finding problems, but has been told that she repeats herself.  She has a history of bipolar for which she takes lamictal.  Denies any depressive symptoms.   - UPDATE 11/09/2015:  She has not noticed any interval worsening of her neuropathy.  She continues to take gabapentin 600mg  TID which provides significant relief of painful paresthesias.  She had one fall over the past year while tripping over a stack of cardboard boxes.  She continues to walk independently.    She still has difficulty with short-term memory, but nothing that has worsened or interfering with  her daily activities.  She is very active working and babysitting her grandchildren.  She has an upcoming trip to [place] scheduled for next month.    Medications:  Current Outpatient Prescriptions on File Prior to Visit  Medication Sig Dispense Refill  . clonazePAM (KLONOPIN) 0.5 MG tablet Take  0.5 mg by mouth at bedtime.    Marland Kitchen ibuprofen (ADVIL,MOTRIN) 200 MG tablet Take 200 mg by mouth every 6 (six) hours as needed.    . lamoTRIgine (LAMICTAL) 200 MG tablet Take 200 mg by mouth daily.    Marland Kitchen levothyroxine (SYNTHROID, LEVOTHROID) 112 MCG tablet Take 112 mcg by mouth daily.    Marland Kitchen lisinopril (PRINIVIL,ZESTRIL) 10 MG tablet Take 10 mg by mouth daily. One tab 3 times a week    . VOLTAREN 1 % GEL   0   No current facility-administered medications on file prior to visit.    Allergies:  Allergies  Allergen Reactions  . Carbamazepine     REACTION: hives  . Sulfonamide Derivatives     REACTION: hives     Review of Systems:  CONSTITUTIONAL: No fevers, chills, night sweats, or weight loss.   EYES: No visual changes or eye pain ENT: No hearing changes.  No history of nose bleeds.   RESPIRATORY: No cough, wheezing and shortness of breath.   CARDIOVASCULAR: Negative for chest pain, and palpitations.   GI: Negative for abdominal discomfort, blood in stools or black stools.  No recent change in bowel habits.   GU:  No history of incontinence.   MUSCLOSKELETAL: +history of joint pain or swelling.  No myalgias.   SKIN: Negative for lesions, rash, and itching.   ENDOCRINE: Negative for cold or heat intolerance, polydipsia or goiter.   PSYCH:  No depression or anxiety symptoms.   NEURO: As Above.   Vital Signs:  BP 118/64 mmHg  Pulse 78  Ht 5' 4.75" (1.645 m)  Wt 148 lb 2 oz (67.189 kg)  BMI 24.83 kg/m2  SpO2 96%  Neurological Exam:  MOCA refused today  Montreal Cognitive Assessment  11/10/2014  Visuospatial/ Executive (0/5) 5  Naming (0/3) 2  Attention: Read list of digits (0/2) 2  Attention: Read list of letters (0/1) 1  Attention: Serial 7 subtraction starting at 100 (0/3) 3  Language: Repeat phrase (0/2) 2  Language : Fluency (0/1) 0  Abstraction (0/2) 2  Delayed Recall (0/5) 4  Orientation (0/6) 6  Total 27   MENTAL STATUS including orientation to time, place, person,  recent and remote memory, attention span and concentration, language, and fund of knowledge is normal.  Speech is not dysarthric.    CRANIAL NERVES:  Pupils equal round and reactive to light.  Normal conjugate, extra-ocular eye movements in all directions of gaze.  Subtle right ptosis. Face is symmetric. Palate elevates symmetrically.  Tongue is midline.  MOTOR:  Motor strength is 5/5 in all extremities, except interosseus muscles 5-/5, ABP 4+/5, and toe extensors 5-/5 bilaterally.  Tone is normal.   MSRs:  Reflexes are 3+/4 throughout, except absent Achilles bilaterally.  SENSORY:  Vibration absent at great toe bilaterally, pin prick is reduced mid-calf bilaterally.  COORDINATION/GAIT:  Gait narrow based and stable. Mildly unsteady with tandem gait.  Data: EMG 10/25/2013:  There is electrophysiological evidence of a multilevel cervical radiculopathy affecting the right C5, left C7, and bilateral T1 nerve roots/segment, overall mild-to-moderate degree electrically. There is no evidence generalized sensorimotor polyneuropathy or carpal tunnel syndrome affecting the upper extremities.  Labs 10/05/2013:  Vitamin B12 291*, SPEP/UPEP with IFE - no M protein, copper 139  Lab Results  Component Value Date   [id number] 05/06/2014   Labs: HbA1c 6.4 (10/13/2012), TSH 1.62 (03/2013), Cr 0.9 (03/2013)   MRI of the lumbar spine without contrast 12/07/2004: Multilevel spondylosis, advanced at L3-4 L5-S1, and moderate to mild spondylosis L3-4 and mild spinal stenosis at L4-5. An osteophyte at L5-S1 might encroach the extraforaminal portion of the left L5 nerve root. There is no disc herniation.   CT myelogram 07/01/2006:  Multilevel disk degeneration. Disk bulges and mild spurring at L1-2 and L2-3. Moderate to advanced disk degeneration at L3-4 with spondylotic changes, especially on the right. There is lateral recess stenosis on the right and mild L4 impingement on the right.  Disk bulging and facet  arthropathy at L4-5.      IMPRESSION/PLAN: 1.  Peripheral neuropathy affecting bilateral lower extremities, mild   - Strong family history of neuropathy, likely hereditary (CMT1A?)  - Clinically stable with no involvement of the upper extremities  - Continue neurontin 600mg  TID  - Recommend taking a collapsible walking stick during her [place] travels  2.  Mild cognitive impairment, stable  - Neuropsychiatry testing deferred  3.  Bipolar disorder, on lamictal   Return to clinic in 1 year or sooner as needed   The duration of this appointment visit was 25 minutes of face-to-face time with the patient.  Greater than 50% of this time was spent in counseling, explanation of diagnosis, planning of further management, and coordination of care.   Thank you for allowing me to participate in patient's care.  If I can answer any additional questions, I would be pleased to do so.    Sincerely,    Alieu Finnigan K. Posey Pronto, DO

## 2015-11-09 NOTE — Patient Instructions (Signed)
Return to clinic 1 year  

## 2015-11-10 ENCOUNTER — Ambulatory Visit: Payer: No Typology Code available for payment source | Admitting: Neurology

## 2015-11-24 DIAGNOSIS — R3912 Poor urinary stream: Secondary | ICD-10-CM | POA: Diagnosis not present

## 2015-11-24 DIAGNOSIS — N3946 Mixed incontinence: Secondary | ICD-10-CM | POA: Diagnosis not present

## 2015-11-24 DIAGNOSIS — N3000 Acute cystitis without hematuria: Secondary | ICD-10-CM | POA: Diagnosis not present

## 2015-12-01 NOTE — Patient Instructions (Addendum)
Theresa Underwood  12/01/2015   Your procedure is scheduled on: 12-05-15  Report to [facility] Main  Entrance take [facility]  elevators to 3rd floor to  [place] at 530 AM.  Call this number if you have problems the morning of surgery [phone]   Remember: ONLY 1 PERSON MAY GO WITH YOU TO SHORT STAY TO GET  READY MORNING OF [place].  Do not eat food or drink liquids :After Midnight.     Take these medicines the morning of surgery with A SIP OF WATER: GABAPENTIN (NEURONTIN), LAMOTRIGENE(LAMICTAL)                              You may not have any metal on your body including hair pins and              piercings  Do not wear jewelry, make-up, lotions, powders or perfumes, deodorant             Do not wear nail polish.  Do not shave  48 hours prior to surgery.              Men may shave face and neck.   Do not bring valuables to the hospital. [place].  Contacts, dentures or bridgework may not be worn into surgery.  Leave suitcase in the car. After surgery it may be brought to your room.                  Please read over the following fact sheets you were given: _____________________________________________________________________                          [facility] - Preparing for Surgery Before surgery, you can play an important role.  Because skin is not sterile, your skin needs to be as free of germs as possible.  You can reduce the number of germs on your skin by washing with CHG (chlorahexidine gluconate) soap before surgery.  CHG is an antiseptic cleaner which kills germs and bonds with the skin to continue killing germs even after washing. Please DO NOT use if you have an allergy to CHG or antibacterial soaps.  If your skin becomes reddened/irritated stop using the CHG and inform your nurse when you arrive at Short Stay. Do not shave (including legs and underarms) for at least 48 hours prior  to the first CHG shower.  You may shave your face/neck. Please follow these instructions carefully:  1.  Shower with CHG Soap the night before surgery and the  morning of Surgery.  2.  If you choose to wash your hair, wash your hair first as usual with your  normal  shampoo.  3.  After you shampoo, rinse your hair and body thoroughly to remove the  shampoo.                           4.  Use CHG as you would any other liquid soap.  You can apply chg directly  to the skin and wash                       Gently  with a scrungie or clean washcloth.  5.  Apply the CHG Soap to your body ONLY FROM THE NECK DOWN.   Do not use on face/ open                           Wound or open sores. Avoid contact with eyes, ears mouth and genitals (private parts).                       Wash face,  Genitals (private parts) with your normal soap.             6.  Wash thoroughly, paying special attention to the area where your surgery  will be performed.  7.  Thoroughly rinse your body with warm water from the neck down.  8.  DO NOT shower/wash with your normal soap after using and rinsing off  the CHG Soap.                9.  Pat yourself dry with a clean towel.            10.  Wear clean pajamas.            11.  Place clean sheets on your bed the night of your first shower and do not  sleep with pets. Day of Surgery : Do not apply any lotions/deodorants the morning of surgery.  Please wear clean clothes to the hospital/surgery center.  FAILURE TO FOLLOW THESE INSTRUCTIONS MAY RESULT IN THE CANCELLATION OF YOUR SURGERY PATIENT SIGNATURE_________________________________  NURSE SIGNATURE__________________________________  ________________________________________________________________________  WHAT IS A BLOOD TRANSFUSION? Blood Transfusion Information  A transfusion is the replacement of blood or some of its parts. Blood is made up of multiple cells which provide different functions.  Red blood cells carry oxygen  and are used for blood loss replacement.  White blood cells fight against infection.  Platelets control bleeding.  Plasma helps clot blood.  Other blood products are available for specialized needs, such as hemophilia or other clotting disorders. BEFORE THE TRANSFUSION  Who gives blood for transfusions?   Healthy volunteers who are fully evaluated to make sure their blood is safe. This is blood bank blood. Transfusion therapy is the safest it has ever been in the practice of medicine. Before blood is taken from a donor, a complete history is taken to make sure that person has no history of diseases nor engages in risky social behavior (examples are intravenous drug use or sexual activity with multiple partners). The donor's travel history is screened to minimize risk of transmitting infections, such as malaria. The donated blood is tested for signs of infectious diseases, such as HIV and hepatitis. The blood is then tested to be sure it is compatible with you in order to minimize the chance of a transfusion reaction. If you or a relative donates blood, this is often done in anticipation of surgery and is not appropriate for emergency situations. It takes many days to process the donated blood. RISKS AND COMPLICATIONS Although transfusion therapy is very safe and saves many lives, the main dangers of transfusion include:   Getting an infectious disease.  Developing a transfusion reaction. This is an allergic reaction to something in the blood you were given. Every precaution is taken to prevent this. The decision to have a blood transfusion has been considered carefully by your caregiver before blood is given. Blood is not given unless the benefits outweigh the  risks. AFTER THE TRANSFUSION  Right after receiving a blood transfusion, you will usually feel much better and more energetic. This is especially true if your red blood cells have gotten low (anemic). The transfusion raises the level of  the red blood cells which carry oxygen, and this usually causes an energy increase.  The nurse administering the transfusion will monitor you carefully for complications. HOME CARE INSTRUCTIONS  No special instructions are needed after a transfusion. You may find your energy is better. Speak with your caregiver about any limitations on activity for underlying diseases you may have. SEEK MEDICAL CARE IF:   Your condition is not improving after your transfusion.  You develop redness or irritation at the intravenous (IV) site. SEEK IMMEDIATE MEDICAL CARE IF:  Any of the following symptoms occur over the next 12 hours:  Shaking chills.  You have a temperature by mouth above 102 F (38.9 C), not controlled by medicine.  Chest, back, or muscle pain.  People around you feel you are not acting correctly or are confused.  Shortness of breath or difficulty breathing.  Dizziness and fainting.  You get a rash or develop hives.  You have a decrease in urine output.  Your urine turns a dark color or changes to pink, red, or brown. Any of the following symptoms occur over the next 10 days:  You have a temperature by mouth above 102 F (38.9 C), not controlled by medicine.  Shortness of breath.  Weakness after normal activity.  The white part of the eye turns yellow (jaundice).  You have a decrease in the amount of urine or are urinating less often.  Your urine turns a dark color or changes to pink, red, or brown. Document Released: 05/31/2000 Document Revised: 08/26/2011 Document Reviewed: 01/18/2008 [facility] Patient Information 2014 [place], [place].  _______________________________________________________________________

## 2015-12-04 ENCOUNTER — Encounter (HOSPITAL_COMMUNITY)
Admission: RE | Admit: 2015-12-04 | Discharge: 2015-12-04 | Disposition: A | Discharge status: Home / Self Care | Payer: Medicare Other | Source: Ambulatory Visit | Attending: Urology | Admitting: Urology

## 2015-12-04 ENCOUNTER — Encounter (HOSPITAL_COMMUNITY): Payer: Self-pay

## 2015-12-04 DIAGNOSIS — F319 Bipolar disorder, unspecified: Secondary | ICD-10-CM | POA: Diagnosis not present

## 2015-12-04 DIAGNOSIS — I10 Essential (primary) hypertension: Secondary | ICD-10-CM | POA: Diagnosis not present

## 2015-12-04 DIAGNOSIS — Z96659 Presence of unspecified artificial knee joint: Secondary | ICD-10-CM | POA: Diagnosis not present

## 2015-12-04 DIAGNOSIS — Z79899 Other long term (current) drug therapy: Secondary | ICD-10-CM | POA: Diagnosis not present

## 2015-12-04 DIAGNOSIS — Z9851 Tubal ligation status: Secondary | ICD-10-CM | POA: Diagnosis not present

## 2015-12-04 DIAGNOSIS — E039 Hypothyroidism, unspecified: Secondary | ICD-10-CM | POA: Diagnosis not present

## 2015-12-04 DIAGNOSIS — Z90710 Acquired absence of both cervix and uterus: Secondary | ICD-10-CM | POA: Diagnosis not present

## 2015-12-04 DIAGNOSIS — N8110 Cystocele, unspecified: Secondary | ICD-10-CM | POA: Diagnosis not present

## 2015-12-04 HISTORY — DX: Unspecified hearing loss, unspecified ear: H91.90

## 2015-12-04 HISTORY — DX: Adverse effect of unspecified anesthetic, initial encounter: T41.45XA

## 2015-12-04 HISTORY — DX: Ganglion, right ankle and foot: M67.471

## 2015-12-04 HISTORY — DX: Contusion of right great toe with damage to nail, initial encounter: S90.211A

## 2015-12-04 HISTORY — DX: Personal history of urinary (tract) infections: Z87.440

## 2015-12-04 HISTORY — DX: Pain, unspecified: R52

## 2015-12-04 HISTORY — DX: Gastro-esophageal reflux disease without esophagitis: K21.9

## 2015-12-04 HISTORY — DX: Other complications of anesthesia, initial encounter: T88.59XA

## 2015-12-04 LAB — CBC
HCT: 32 % — ABNORMAL LOW (ref 36.0–46.0)
Hemoglobin: 10.8 g/dL — ABNORMAL LOW (ref 12.0–15.0)
MCH: 30.9 pg (ref 26.0–34.0)
MCHC: 33.8 g/dL (ref 30.0–36.0)
MCV: 91.4 fL (ref 78.0–100.0)
PLATELETS: 309 10*3/uL (ref 150–400)
RBC: 3.5 MIL/uL — AB (ref 3.87–5.11)
RDW: 13.3 % (ref 11.5–15.5)
WBC: 5.2 10*3/uL (ref 4.0–10.5)

## 2015-12-04 LAB — APTT: APTT: 30 s (ref 24–37)

## 2015-12-04 LAB — PROTIME-INR
INR: 1.01 (ref 0.00–1.49)
PROTHROMBIN TIME: 13.1 s (ref 11.6–15.2)

## 2015-12-04 LAB — BASIC METABOLIC PANEL
ANION GAP: 5 (ref 5–15)
BUN: 17 mg/dL (ref 6–20)
CHLORIDE: 107 mmol/L (ref 101–111)
CO2: 26 mmol/L (ref 22–32)
Calcium: 9 mg/dL (ref 8.9–10.3)
Creatinine, Ser: 0.85 mg/dL (ref 0.44–1.00)
GFR calc Af Amer: >60 mL/min (ref >60)
GLUCOSE: 114 mg/dL — AB (ref 65–99)
POTASSIUM: 4.7 mmol/L (ref 3.5–5.1)
SODIUM: 138 mmol/L (ref 135–145)

## 2015-12-04 MED ORDER — GENTAMICIN SULFATE 40 MG/ML IJ SOLN
5.0000 mg/kg | INTRAVENOUS | Status: AC
  Administered 2015-12-05: 330 mg via INTRAVENOUS
  Filled 2015-12-04 (×2): qty 8.25

## 2015-12-04 NOTE — H&P (Signed)
CC/HPI: Theresa Underwood says her vaginal pain is dramatically better on estrogen. I talked to her about watchful waiting and surgery.   I drew her a picture and we talked about prolapse surgery in detail. Pros, cons, general surgical and anesthetic risks, and other options including behavioral therapy, pessaries, and watchful waiting were discussed. She understands that prolapse repairs are successful in 80-85% of cases for prolapse symptoms and can recur anteriorly, posteriorly, and/or apically. She understands that in most cases I use a graft and general risks were discussed. Surgical risks were described but not limited to the discussion of injury to neighboring structures including the bowel (with possible life-threatening sepsis and colostomy), bladder, urethra, vagina (all resulting in further surgery), and ureter (resulting in re-implantation). We talked about injury to nerves/soft tissue leading to debilitating and intractable pelvic, abdominal, and lower extremity pain syndromes and neuropathies. The risks of buttock pain, intractable dyspareunia, and vaginal narrowing and shortening with sequelae were discussed. Bleeding risks, transfusion rates, and infection were discussed. The risk of persistent, de novo, or worsening bladder and/or bowel incontinence/dysfunction was discussed. The need for CIC was described as well the usual post-operative course. The patient understands that she might not reach her treatment goal and that she might be worse following surgery.    Worsening incontinence discussed. We are not going to repeat urodynamics. She is somewhat reluctant to proceed with surgery and she is already scheduled for next month. I will have Pam call her next week to make her final decision. The natural history of prolapse discussed.   the patient was brought in today for a possible urinary tract infection. since Wednesday patient has suprapubic pressure frequency burning. She's not been actively  treated. She says she's never been on a once a day antibiotic   She was treated over the phone in May for similar symptoms by her primary physician with ciprofloxacin   There is no other aggravating or relieving factors  There is no other associated signs and symptoms  The severity of the symptoms is moderate  The symptoms are ongoing and bothersome      ALLERGIES: Sulfa Drugs Sulfa Drugs TEGretol TABS TEGretol TABS    MEDICATIONS: ClonazePAM 0.5 MG Oral Tablet Oral  Gabapentin 600 MG Oral Tablet Oral  LamoTRIgine 200 MG Oral Tablet Oral  Lisinopril TABS Oral  Neomycin-Polymyxin-Dexameth 3.5-10000-0.1 Ophthalmic Suspension Ophthalmic  Premarin 0.625 MG/GM Vaginal Cream 0 Vaginal  Synthroid 112 MCG Oral Tablet Oral     GU PSH: Hysterectomy Unilat SO - 2014      PSH Notes: Adenoidectomy, Tubal Ligation, Tonsillectomy, Knee Replacement, Bladder Surgery, Hysterectomy, Rectocele Repair   NON-GU PSH: Rectocele Repair - 2014 Remove Adenoids - 2014 Remove Tonsils - 2014 Revise Knee Joint - 2014 Tubal Ligation - 2014    GU PMH: Cystocele, midline, Cystocele, midline - 09/28/2015 Mixed incontinence, Urge and stress incontinence - 09/28/2015 Hydronephrosis Unspec, Hydronephrosis - 2014 Nocturia, Nocturia - 2014 Poor urinary stream, Weak urinary stream - 2014 Urinary Retention, Unspec, Incomplete bladder emptying - 2014    NON-GU PMH: Encounter for general adult medical examination without abnormal findings, Encounter for preventive health examination - 02/23/2015 Personal history of other diseases of the circulatory system, History of hypertension - 2014 Personal history of other endocrine, nutritional and metabolic disease, History of hypothyroidism - 2014 Personal history of other mental and behavioral disorders, History of depression - 2014 , Arthritis - 2014    FAMILY HISTORY: Bipolar Disorder - Mother Death In The Family Father -  Father Family Health Status Number Of  Children - Runs In Family pneumonia - Father   SOCIAL HISTORY: Marital Status: Married     Notes: Never A Smoker, Caffeine Use, Marital History - Currently Married, Tobacco Use, Alcohol Use, Occupation:   REVIEW OF SYSTEMS:    GU Review Female:   Patient reports frequent urination, hard to postpone urination, burning /pain with urination, get up at night to urinate, and stream starts and stops. Patient denies leakage of urine, trouble starting your stream, have to strain to urinate, and currently pregnant.  Gastrointestinal (Upper):   Patient denies nausea, vomiting, and indigestion/ heartburn.  Gastrointestinal (Lower):   Patient denies diarrhea and constipation.  Constitutional:   Patient denies fever, night sweats, weight loss, and fatigue.  Skin:   Patient denies skin rash/ lesion and itching.  Eyes:   Patient denies blurred vision and double vision.  Ears/ Nose/ Throat:   Patient denies sore throat and sinus problems.  Hematologic/Lymphatic:   Patient denies swollen glands and easy bruising.  Cardiovascular:   Patient denies leg swelling and chest pains.  Respiratory:   Patient denies cough and shortness of breath.  Endocrine:   Patient denies excessive thirst.  Musculoskeletal:   Patient denies back pain and joint pain.  Neurological:   Patient denies headaches and dizziness.  Psychologic:   Patient denies depression and anxiety.   VITAL SIGNS:    BP: 138/84 mmHg   Pulse: 75 /min   Temp: 98.0 F / 37 C      PAST DATA REVIEWED:  Source Of History:  Patient   PROCEDURES:          Urinalysis - 81003 Dipstick Dipstick Cont'd  Specimen: Voided Blood: Neg  Appearance: Clear Nitrites: Neg  Color: Straw Leukocyte Esterase: Neg    ASSESSMENT:      ICD-10 Details  1 GU:   Acute Cystitis w/o hematuria - N30.00           Notes:   the patient's surgery is an approximately 1-2 weeks. Clinically she has a urinary tract infection. Urine sent for culture. We will call her next week  and make sure her symptoms settled down prior to surgery.   urinalysis was normal but clinically she had a urinary tract infection.he was quite upset about her symptoms today and phone numbers were exchanged    PLAN:            Medications New Meds: Cipro 250 mg tablet 1 tablet PO BID   #14  0 Refill(s)   After a thorough review of the management options for the patient's condition the patient  elected to proceed with surgical therapy as noted above. We have discussed the potential benefits and risks of the procedure, side effects of the proposed treatment, the likelihood of the patient achieving the goals of the procedure, and any potential problems that might occur during the procedure or recuperation. Informed consent has been obtained.

## 2015-12-05 ENCOUNTER — Ambulatory Visit (HOSPITAL_COMMUNITY): Payer: Medicare Other | Admitting: Anesthesiology

## 2015-12-05 ENCOUNTER — Encounter (HOSPITAL_COMMUNITY): Payer: Self-pay | Admitting: *Deleted

## 2015-12-05 ENCOUNTER — Observation Stay (HOSPITAL_COMMUNITY)
Admission: RE | Admit: 2015-12-05 | Discharge: 2015-12-06 | Disposition: A | Discharge status: Home / Self Care | Payer: Medicare Other | Source: Ambulatory Visit | Attending: Urology | Admitting: Urology

## 2015-12-05 ENCOUNTER — Encounter (HOSPITAL_COMMUNITY)
Admission: RE | Disposition: A | Discharge status: Home / Self Care | Payer: Self-pay | Source: Ambulatory Visit | Attending: Urology

## 2015-12-05 DIAGNOSIS — I10 Essential (primary) hypertension: Secondary | ICD-10-CM | POA: Diagnosis not present

## 2015-12-05 DIAGNOSIS — Z90710 Acquired absence of both cervix and uterus: Secondary | ICD-10-CM | POA: Diagnosis not present

## 2015-12-05 DIAGNOSIS — Z79899 Other long term (current) drug therapy: Secondary | ICD-10-CM | POA: Insufficient documentation

## 2015-12-05 DIAGNOSIS — N8111 Cystocele, midline: Secondary | ICD-10-CM | POA: Diagnosis not present

## 2015-12-05 DIAGNOSIS — N814 Uterovaginal prolapse, unspecified: Secondary | ICD-10-CM | POA: Diagnosis not present

## 2015-12-05 DIAGNOSIS — N8110 Cystocele, unspecified: Principal | ICD-10-CM | POA: Insufficient documentation

## 2015-12-05 DIAGNOSIS — Z96659 Presence of unspecified artificial knee joint: Secondary | ICD-10-CM | POA: Insufficient documentation

## 2015-12-05 DIAGNOSIS — N8189 Other female genital prolapse: Secondary | ICD-10-CM | POA: Diagnosis not present

## 2015-12-05 DIAGNOSIS — E039 Hypothyroidism, unspecified: Secondary | ICD-10-CM | POA: Insufficient documentation

## 2015-12-05 DIAGNOSIS — F319 Bipolar disorder, unspecified: Secondary | ICD-10-CM | POA: Diagnosis not present

## 2015-12-05 DIAGNOSIS — IMO0002 Reserved for concepts with insufficient information to code with codable children: Secondary | ICD-10-CM | POA: Diagnosis present

## 2015-12-05 DIAGNOSIS — Z9851 Tubal ligation status: Secondary | ICD-10-CM | POA: Insufficient documentation

## 2015-12-05 HISTORY — PX: CYSTOCELE REPAIR: SHX163

## 2015-12-05 HISTORY — PX: VAGINAL PROLAPSE REPAIR: SHX830

## 2015-12-05 LAB — TYPE AND SCREEN
ABO/RH(D): B POS
ANTIBODY SCREEN: NEGATIVE

## 2015-12-05 LAB — HEMOGLOBIN AND HEMATOCRIT, BLOOD
HCT: 30.5 % — ABNORMAL LOW (ref 36.0–46.0)
Hemoglobin: 10.3 g/dL — ABNORMAL LOW (ref 12.0–15.0)

## 2015-12-05 SURGERY — COLPORRHAPHY, ANTERIOR, FOR CYSTOCELE REPAIR
Anesthesia: General

## 2015-12-05 MED ORDER — HYDROMORPHONE HCL 1 MG/ML IJ SOLN: 0.2500 mg | INTRAMUSCULAR | Status: DC | PRN

## 2015-12-05 MED ORDER — DIPHENHYDRAMINE HCL 12.5 MG/5ML PO ELIX: 12.5000 mg | ORAL_SOLUTION | Freq: Four times a day (QID) | ORAL | Status: DC | PRN

## 2015-12-05 MED ORDER — PROPOFOL 10 MG/ML IV BOLUS
INTRAVENOUS | Status: DC | PRN
  Administered 2015-12-05: 120 mg via INTRAVENOUS

## 2015-12-05 MED ORDER — SUGAMMADEX SODIUM 200 MG/2ML IV SOLN
INTRAVENOUS | Status: DC | PRN
  Administered 2015-12-05: 140 mg via INTRAVENOUS

## 2015-12-05 MED ORDER — LACTATED RINGERS IV SOLN: INTRAVENOUS | Status: DC

## 2015-12-05 MED ORDER — DEXAMETHASONE SODIUM PHOSPHATE 10 MG/ML IJ SOLN
INTRAMUSCULAR | Status: DC | PRN
  Administered 2015-12-05: 10 mg via INTRAVENOUS

## 2015-12-05 MED ORDER — PROPOFOL 10 MG/ML IV BOLUS
INTRAVENOUS | Status: AC
  Filled 2015-12-05: qty 20

## 2015-12-05 MED ORDER — ESTRADIOL 0.1 MG/GM VA CREA
TOPICAL_CREAM | VAGINAL | Status: AC
  Filled 2015-12-05: qty 85

## 2015-12-05 MED ORDER — LAMOTRIGINE 100 MG PO TABS
200.0000 mg | ORAL_TABLET | Freq: Every day | ORAL | Status: DC
  Administered 2015-12-06: 200 mg via ORAL
  Filled 2015-12-05: qty 2

## 2015-12-05 MED ORDER — CLONAZEPAM 0.5 MG PO TABS
0.5000 mg | ORAL_TABLET | Freq: Every day | ORAL | Status: DC
  Administered 2015-12-05: 0.5 mg via ORAL
  Filled 2015-12-05: qty 1

## 2015-12-05 MED ORDER — SODIUM CHLORIDE 0.9 % IR SOLN
Status: AC
  Filled 2015-12-05: qty 1

## 2015-12-05 MED ORDER — OXYCODONE HCL 5 MG PO TABS: 5.0000 mg | ORAL_TABLET | Freq: Once | ORAL | Status: DC | PRN

## 2015-12-05 MED ORDER — ONDANSETRON HCL 4 MG/2ML IJ SOLN: 4.0000 mg | INTRAMUSCULAR | Status: DC | PRN

## 2015-12-05 MED ORDER — DIPHENHYDRAMINE HCL 50 MG/ML IJ SOLN
12.5000 mg | Freq: Four times a day (QID) | INTRAMUSCULAR | Status: DC | PRN
Start: 2015-12-05 — End: 2015-12-06

## 2015-12-05 MED ORDER — CEFAZOLIN SODIUM-DEXTROSE 2-4 GM/100ML-% IV SOLN
INTRAVENOUS | Status: AC
  Filled 2015-12-05: qty 100

## 2015-12-05 MED ORDER — HYDROCODONE-ACETAMINOPHEN 5-325 MG PO TABS: 1.0000 | ORAL_TABLET | ORAL | Status: DC | PRN

## 2015-12-05 MED ORDER — EPHEDRINE SULFATE 50 MG/ML IJ SOLN
INTRAMUSCULAR | Status: AC
  Filled 2015-12-05: qty 1

## 2015-12-05 MED ORDER — LIDOCAINE HCL (CARDIAC) 20 MG/ML IV SOLN
INTRAVENOUS | Status: DC | PRN
  Administered 2015-12-05: 50 mg via INTRAVENOUS

## 2015-12-05 MED ORDER — LIDOCAINE HCL (CARDIAC) 20 MG/ML IV SOLN
INTRAVENOUS | Status: AC
  Filled 2015-12-05: qty 5

## 2015-12-05 MED ORDER — DOCUSATE SODIUM 100 MG PO CAPS: 100.0000 mg | ORAL_CAPSULE | Freq: Two times a day (BID) | ORAL | Status: DC

## 2015-12-05 MED ORDER — ONDANSETRON HCL 4 MG/2ML IJ SOLN
INTRAMUSCULAR | Status: DC | PRN
  Administered 2015-12-05: 4 mg via INTRAVENOUS

## 2015-12-05 MED ORDER — DEXTROSE-NACL 5-0.45 % IV SOLN
INTRAVENOUS | Status: DC
  Administered 2015-12-05 – 2015-12-06 (×3): via INTRAVENOUS

## 2015-12-05 MED ORDER — ACETAMINOPHEN 325 MG PO TABS
650.0000 mg | ORAL_TABLET | ORAL | Status: DC | PRN
  Administered 2015-12-05 (×2): 650 mg via ORAL
  Filled 2015-12-05 (×2): qty 2

## 2015-12-05 MED ORDER — ONDANSETRON HCL 4 MG/2ML IJ SOLN
4.0000 mg | Freq: Four times a day (QID) | INTRAMUSCULAR | Status: DC | PRN
Start: 2015-12-05 — End: 2015-12-05

## 2015-12-05 MED ORDER — PHENAZOPYRIDINE HCL 200 MG PO TABS
200.0000 mg | ORAL_TABLET | Freq: Once | ORAL | Status: AC
  Administered 2015-12-05: 200 mg via ORAL
  Filled 2015-12-05: qty 1

## 2015-12-05 MED ORDER — EPHEDRINE SULFATE 50 MG/ML IJ SOLN
INTRAMUSCULAR | Status: DC | PRN
  Administered 2015-12-05 (×2): 5 mg via INTRAVENOUS

## 2015-12-05 MED ORDER — LIDOCAINE-EPINEPHRINE (PF) 1 %-1:200000 IJ SOLN
INTRAMUSCULAR | Status: DC | PRN
  Administered 2015-12-05: 25 mL

## 2015-12-05 MED ORDER — ROCURONIUM BROMIDE 100 MG/10ML IV SOLN
INTRAVENOUS | Status: DC | PRN
  Administered 2015-12-05 (×2): 5 mg via INTRAVENOUS
  Administered 2015-12-05: 10 mg via INTRAVENOUS
  Administered 2015-12-05: 40 mg via INTRAVENOUS

## 2015-12-05 MED ORDER — ESTRADIOL 0.1 MG/GM VA CREA
TOPICAL_CREAM | VAGINAL | Status: DC | PRN
  Administered 2015-12-05: 1 via VAGINAL

## 2015-12-05 MED ORDER — MORPHINE SULFATE (PF) 2 MG/ML IV SOLN: 2.0000 mg | INTRAVENOUS | Status: DC | PRN

## 2015-12-05 MED ORDER — MIDAZOLAM HCL 5 MG/5ML IJ SOLN
INTRAMUSCULAR | Status: DC | PRN
  Administered 2015-12-05: 2 mg via INTRAVENOUS

## 2015-12-05 MED ORDER — LACTATED RINGERS IV SOLN
INTRAVENOUS | Status: DC | PRN
  Administered 2015-12-05: 07:00:00 via INTRAVENOUS

## 2015-12-05 MED ORDER — MIDAZOLAM HCL 2 MG/2ML IJ SOLN
INTRAMUSCULAR | Status: AC
  Filled 2015-12-05: qty 2

## 2015-12-05 MED ORDER — DEXAMETHASONE SODIUM PHOSPHATE 10 MG/ML IJ SOLN
INTRAMUSCULAR | Status: AC
  Filled 2015-12-05: qty 1

## 2015-12-05 MED ORDER — LEVOTHYROXINE SODIUM 112 MCG PO TABS
112.0000 ug | ORAL_TABLET | Freq: Every day | ORAL | Status: DC
  Administered 2015-12-05: 112 ug via ORAL
  Filled 2015-12-05 (×2): qty 1

## 2015-12-05 MED ORDER — SODIUM CHLORIDE 0.9 % IJ SOLN
INTRAMUSCULAR | Status: AC
  Filled 2015-12-05: qty 10

## 2015-12-05 MED ORDER — FENTANYL CITRATE (PF) 100 MCG/2ML IJ SOLN
INTRAMUSCULAR | Status: DC | PRN
  Administered 2015-12-05: 100 ug via INTRAVENOUS

## 2015-12-05 MED ORDER — LIDOCAINE-EPINEPHRINE (PF) 1 %-1:200000 IJ SOLN
INTRAMUSCULAR | Status: AC
  Filled 2015-12-05: qty 60

## 2015-12-05 MED ORDER — ROCURONIUM BROMIDE 100 MG/10ML IV SOLN
INTRAVENOUS | Status: AC
  Filled 2015-12-05: qty 1

## 2015-12-05 MED ORDER — STERILE WATER FOR IRRIGATION IR SOLN
Status: DC | PRN
  Administered 2015-12-05: 3000 mL

## 2015-12-05 MED ORDER — HYDROCODONE-ACETAMINOPHEN 5-325 MG PO TABS: 1.0000 | ORAL_TABLET | Freq: Four times a day (QID) | ORAL | Status: DC | PRN

## 2015-12-05 MED ORDER — OXYCODONE HCL 5 MG/5ML PO SOLN: 5.0000 mg | Freq: Once | ORAL | Status: DC | PRN

## 2015-12-05 MED ORDER — 0.9 % SODIUM CHLORIDE (POUR BTL) OPTIME
TOPICAL | Status: DC | PRN
  Administered 2015-12-05: 1000 mL

## 2015-12-05 MED ORDER — GABAPENTIN 300 MG PO CAPS
600.0000 mg | ORAL_CAPSULE | Freq: Three times a day (TID) | ORAL | Status: DC
  Administered 2015-12-05 – 2015-12-06 (×3): 600 mg via ORAL
  Filled 2015-12-05 (×3): qty 2

## 2015-12-05 MED ORDER — LISINOPRIL 10 MG PO TABS
10.0000 mg | ORAL_TABLET | ORAL | Status: DC
  Filled 2015-12-05: qty 1

## 2015-12-05 MED ORDER — SODIUM CHLORIDE 0.9 % IR SOLN
Status: DC | PRN
  Administered 2015-12-05: 500 mL

## 2015-12-05 MED ORDER — ONDANSETRON HCL 4 MG/2ML IJ SOLN
INTRAMUSCULAR | Status: AC
  Filled 2015-12-05: qty 2

## 2015-12-05 MED ORDER — CEFAZOLIN SODIUM-DEXTROSE 2-4 GM/100ML-% IV SOLN
2.0000 g | INTRAVENOUS | Status: AC
  Administered 2015-12-05: 2 g via INTRAVENOUS
  Filled 2015-12-05: qty 100

## 2015-12-05 MED ORDER — FENTANYL CITRATE (PF) 250 MCG/5ML IJ SOLN
INTRAMUSCULAR | Status: AC
  Filled 2015-12-05: qty 5

## 2015-12-05 SURGICAL SUPPLY — 50 items
ALLOGRAFT TUTOPLAST AXIS 6X12 (Tissue) ×1 IMPLANT
BAG URINE DRAINAGE (UROLOGICAL SUPPLIES) ×1 IMPLANT
BLADE SURG 15 STRL LF DISP TIS (BLADE) ×1 IMPLANT
BLADE SURG 15 STRL SS (BLADE) ×2
CATH FOLEY 2WAY SLVR  5CC 14FR (CATHETERS) ×1
CATH FOLEY 2WAY SLVR 5CC 14FR (CATHETERS) ×1 IMPLANT
COVER MAYO STAND STRL (DRAPES) IMPLANT
COVER SURGICAL LIGHT HANDLE (MISCELLANEOUS) ×1 IMPLANT
DEVICE CAPIO SLIM SINGLE (INSTRUMENTS) ×2 IMPLANT
DRAIN PENROSE 18X1/4 LTX STRL (WOUND CARE) ×2 IMPLANT
DRAPE SHEET LG 3/4 BI-LAMINATE (DRAPES) ×2 IMPLANT
ELECT PENCIL ROCKER SW 15FT (MISCELLANEOUS) ×2 IMPLANT
GAUZE PACKING 2X5 YD STRL (GAUZE/BANDAGES/DRESSINGS) ×2 IMPLANT
GAUZE SPONGE 4X4 16PLY XRAY LF (GAUZE/BANDAGES/DRESSINGS) ×6 IMPLANT
GLOVE BIO SURGEON STRL SZ 6.5 (GLOVE) ×2 IMPLANT
GLOVE BIOGEL M STRL SZ7.5 (GLOVE) ×3 IMPLANT
GLOVE ECLIPSE 8.5 STRL (GLOVE) ×3 IMPLANT
GOWN STRL REUS W/TWL LRG LVL3 (GOWN DISPOSABLE) ×4 IMPLANT
GOWN STRL REUS W/TWL XL LVL3 (GOWN DISPOSABLE) ×2 IMPLANT
HOLDER FOLEY CATH W/STRAP (MISCELLANEOUS) ×2 IMPLANT
IV NS 1000ML (IV SOLUTION)
IV NS 1000ML BAXH (IV SOLUTION) ×1 IMPLANT
KIT BASIN OR (CUSTOM PROCEDURE TRAY) ×2 IMPLANT
NDL MAYO 6 CRC TAPER PT (NEEDLE) ×1 IMPLANT
NEEDLE HYPO 22GX1.5 SAFETY (NEEDLE) ×1 IMPLANT
NEEDLE MAYO 6 CRC TAPER PT (NEEDLE) ×2 IMPLANT
NS IRRIG 1000ML POUR BTL (IV SOLUTION) ×2 IMPLANT
PACK CYSTO (CUSTOM PROCEDURE TRAY) ×2 IMPLANT
PACKING VAGINAL (PACKING) ×1 IMPLANT
PLUG CATH AND CAP STER (CATHETERS) ×2 IMPLANT
RETRACTOR STAY HOOK 5MM (MISCELLANEOUS) ×2 IMPLANT
SHEET LAVH (DRAPES) ×2 IMPLANT
SUT CAPIO ETHIBPND (SUTURE) ×2 IMPLANT
SUT VIC AB 0 CT1 27 (SUTURE) ×2
SUT VIC AB 0 CT1 27XBRD ANTBC (SUTURE) ×1 IMPLANT
SUT VIC AB 2-0 CT1 27 (SUTURE) ×4
SUT VIC AB 2-0 CT1 27XBRD (SUTURE) ×2 IMPLANT
SUT VIC AB 2-0 SH 27 (SUTURE) ×6
SUT VIC AB 2-0 SH 27X BRD (SUTURE) ×2 IMPLANT
SUT VIC AB 3-0 SH 27 (SUTURE) ×10
SUT VIC AB 3-0 SH 27XBRD (SUTURE) ×2 IMPLANT
SUT VICRYL 0 UR6 27IN ABS (SUTURE) ×4 IMPLANT
SYRINGE 10CC LL (SYRINGE) ×2 IMPLANT
TOWEL OR 17X26 10 PK STRL BLUE (TOWEL DISPOSABLE) ×2 IMPLANT
TOWEL OR NON WOVEN STRL DISP B (DISPOSABLE) ×2 IMPLANT
TUBING CONNECTING 10 (TUBING) ×2 IMPLANT
TUTOPLAST AXIS 6X12 (Tissue) ×2 IMPLANT
WATER STERILE IRR 1500ML POUR (IV SOLUTION) ×1 IMPLANT
WATER STERILE IRR 500ML POUR (IV SOLUTION) ×1 IMPLANT
YANKAUER SUCT BULB TIP 10FT TU (MISCELLANEOUS) ×2 IMPLANT

## 2015-12-05 NOTE — Anesthesia Postprocedure Evaluation (Signed)
Anesthesia Post Note  Patient: Theresa Underwood  Procedure(s) Performed: Procedure(s) (LRB): CYSTOSCOPY, ANTERIOR REPAIR (CYSTOCELE), VAULT PROLAPSE AND GRAFT (N/A) VAGINAL VAULT SUSPENSION (N/A)  Patient location during evaluation: PACU Anesthesia Type: General Level of consciousness: awake and alert and patient cooperative Pain management: pain level controlled Vital Signs Assessment: post-procedure vital signs reviewed and stable Respiratory status: spontaneous breathing and respiratory function stable Cardiovascular status: stable Anesthetic complications: no    Last Vitals:  Filed Vitals:   12/05/15 1103 12/05/15 1507  BP: 124/72 104/48  Pulse: 65 69  Temp:  36.8 C  Resp: 14 16    Last Pain:  Filed Vitals:   12/05/15 1508  PainSc: 2                  [name] S

## 2015-12-05 NOTE — Interval H&P Note (Signed)
History and Physical Interval Note:  12/05/2015 7:07 AM  Theresa Underwood  has presented today for surgery, with the diagnosis of cystocele and vault prolapse  The various methods of treatment have been discussed with the patient and family. After consideration of risks, benefits and other options for treatment, the patient has consented to  Procedure(s): CYSTOSCOPY, ANTERIOR REPAIR (CYSTOCELE), VAULT PROLAPSE AND GRAFT (N/A) VAGINAL VAULT SUSPENSION (N/A) as a surgical intervention .  [name] history has been reviewed, patient examined, no change in status, stable for surgery.  I have reviewed the patient's chart and labs.  Questions were answered to the patient's satisfaction.     [name]

## 2015-12-05 NOTE — Progress Notes (Signed)
Day of Surgery   Subjective: The patient is doing well.  No nausea or vomiting. Tolerating regular food. Pain is adequately controlled. No leg pain or weakness. Complaining of some vaginal fullness with packing. No buttocks pain. Has not walked yet. AF, VSS. Excellent UOP.  Objective: Vital signs in last 24 hours: Temp:  [96.7 F (35.9 C)-98.3 F (36.8 C)] 98.3 F (36.8 C) (06/20 1507) Pulse Rate:  [63-78] 69 (06/20 1507) Resp:  [10-18] 16 (06/20 1507) BP: (104-135)/(48-75) 104/48 mmHg (06/20 1507) SpO2:  [97 %-100 %] 100 % (06/20 1507) Weight:  [66.316 kg (146 lb 3.2 oz)] 66.316 kg (146 lb 3.2 oz) (06/20 0628)  Intake/Output from previous day:   Intake/Output this shift: Total I/O In: 1208.3 [I.V.:1100; IV Piggyback:108.3] Out: 150 [Urine:130; Blood:20]  Physical Exam:  General: Alert and oriented. CV: RRR Lungs: Clear bilaterally. GI: Soft, Nondistended. GU: Vaginal packing in  Urine: Clear yellow Extremities: Nontender, no erythema, no edema. Normal ROM.  Lab Results:  Recent Labs  12/04/15 0930  HGB 10.8*  HCT 32.0*      Assessment/Plan: POD#0 s/p cystocele repair with vaginal vault suspension with dermis graft.    1) Regular diet, mIVF, SL IVF when taking good PO 2) Ambulate, Incentive spirometry 3) PRN IV and PO pain medications. Transition to oral pain medication tomorrow AM. 4) Keep vaginal packing and foley until tomorrow AM 5) Plan  for likely discharge tomorrow afternoon     Acie Fredrickson 12/05/2015, 4:05 PM

## 2015-12-05 NOTE — Anesthesia Procedure Notes (Signed)
Procedure Name: Intubation Date/Time: 12/05/2015 7:38 AM Performed by: Anne Fu Pre-anesthesia Checklist: Patient identified, Emergency Drugs available, Suction available, Patient being monitored and Timeout performed Patient Re-evaluated:Patient Re-evaluated prior to inductionOxygen Delivery Method: Circle system utilized Preoxygenation: Pre-oxygenation with 100% oxygen Intubation Type: IV induction Ventilation: Mask ventilation without difficulty Laryngoscope Size: Mac and 4 Grade View: Grade II Tube type: Oral Tube size: 7.5 mm Number of attempts: 2 (X 1 attempt per [facility] student without success, 2 attempt per CRNA with success. ) Airway Equipment and Method: Stylet Placement Confirmation: ETT inserted through vocal cords under direct vision,  positive ETCO2,  CO2 detector and breath sounds checked- equal and bilateral Secured at: 21 cm Tube secured with: Tape Dental Injury: Teeth and Oropharynx as per pre-operative assessment

## 2015-12-05 NOTE — Care Management Obs Status (Signed)
[facility] NOTIFICATION   Patient Details  Name: Theresa Underwood MRN: [id number] Date of Birth: [birth year]   Medicare Observation Status Notification Given:  Yes    Purcell Mouton, RN 12/05/2015, 1:26 PM

## 2015-12-05 NOTE — Op Note (Signed)
Preoperative diagnosis: Cystocele and vault prolapse Postoperative diagnosis: Cystocele and vault prolapse Surgery: Cystocele repair and graft and vault prolapse repair and cystoscopy Surgeon: Dr. Nicki Reaper Tala Eber Assistant: Debbrah Alar  The patient has the above diagnoses and consented to the above procedure. Extra care was taken with leg positioning to minimize the risk of compartment syndrome and neuropathy and deep vein thrombosis. She had some shortening of the anterior vaginal wall. Vaginal cuff was easily identified and 3-0 Vicryl was placed. She had a little bit of a trapdoor deformity associated with the large golf ball-looking cystocele. Approximately 23 mL of a lidocaine epinephrine mixture was utilized  Using my Allis technique I made a T-shaped incision starting at the apex all the way to the bladder neck. I sharply and bluntly dissected the overlying thin vaginal wall mucosa from the underlying pubocervical fascia to the white line bilaterally. I was very careful with the apical dissection. One could identify at the apex where there was peritoneum.  There was not a large cystocele to imbricate but I was pleased with a 2 layer anterior repair with running 2-0 Vicryl not imbricating the bladder neck  At this stage I removed the retractors and cystoscoped the patient. The ureters were in normal position with excellent yellow jets bilaterally. There is no bladder injury. She did have a relatively fixed bladder neck  Along the appropriate plane I bluntly dissected to the ischial spine bilaterally. The tissues were easily mobilized medially on the patient's left side but a little bit more challenging on the right. The tissues were thinner on the left and this sacrospinous ligament was more palpable on the patient's right. I was very diligent especially on the patient's right side as well as the left to make certain that soft tissue was not being pulled down with the sutures. With a Capio  device I placed a 0 Ethibond 1 full finger breath medial to the spine in a straight line between the spines. The left side was a few millimeters lower than the right. I did a rectal examination. There was no rectal injury. I was very happy with placement of the sutures and i tripled checked their position.   I cut a 12 x 6 well prepared dermal graft in a 10 x 6 and-shaped in the shape of a trapezoid.  With my appropriate sharp dissection I mobilized the urethrovesical angle and placed a 0 Vicryl on a UR 6 needle at that level. The graft was sewn in tension-free. An appropriate amount of anterior vaginal wall was trimmed and I closed the anterior vaginal wall running 2-0 Vicryl and CT1 needle  The patient had excellent length at the of the case. She had very high urine output throughout the case. Blood loss was less than 100 mL. I was very pleased with her surgery and hopefully the operation will reach the patient's treatment goal

## 2015-12-05 NOTE — Anesthesia Preprocedure Evaluation (Signed)
Anesthesia Evaluation  Patient identified by MRN, date of birth, ID band Patient awake    Reviewed: Allergy & Precautions, NPO status , Patient's Chart, lab work & pertinent test results  Airway Mallampati: II   Neck ROM: full    Dental   Pulmonary neg pulmonary ROS,    breath sounds clear to auscultation       Cardiovascular hypertension,  Rhythm:regular Rate:Normal     Neuro/Psych Depression Bipolar Disorder  Neuromuscular disease    GI/Hepatic GERD  ,  Endo/Other  Hypothyroidism   Renal/GU      Musculoskeletal  (+) Arthritis ,   Abdominal   Peds  Hematology   Anesthesia Other Findings   Reproductive/Obstetrics                             Anesthesia Physical Anesthesia Plan  ASA: II  Anesthesia Plan: General   Post-op Pain Management:    Induction: Intravenous  Airway Management Planned: LMA  Additional Equipment:   Intra-op Plan:   Post-operative Plan:   Informed Consent: I have reviewed the patients History and Physical, chart, labs and discussed the procedure including the risks, benefits and alternatives for the proposed anesthesia with the patient or authorized representative who has indicated his/her understanding and acceptance.     Plan Discussed with: CRNA, Anesthesiologist and Surgeon  Anesthesia Plan Comments:         Anesthesia Quick Evaluation

## 2015-12-05 NOTE — Transfer of Care (Signed)
Immediate Anesthesia Transfer of Care Note  Patient: Theresa Underwood  Procedure(s) Performed: Procedure(s): CYSTOSCOPY, ANTERIOR REPAIR (CYSTOCELE), VAULT PROLAPSE AND GRAFT (N/A) VAGINAL VAULT SUSPENSION (N/A)  Patient Location: PACU  Anesthesia Type:General  Level of Consciousness:  sedated, patient cooperative and responds to stimulation  Airway & Oxygen Therapy:Patient Spontanous Breathing and Patient connected to face mask oxgen  Post-op Assessment:  Report given to PACU RN and Post -op Vital signs reviewed and stable  Post vital signs:  Reviewed and stable  Last Vitals:  Filed Vitals:   12/05/15 0545  BP: 133/75  Pulse: 65  Temp: 36.6 C  Resp: 18    Complications: No apparent anesthesia complications

## 2015-12-05 NOTE — Progress Notes (Signed)
Received pt from PACU, VS obtained, skin assessment complete, oriented to unit, call light in reach

## 2015-12-05 NOTE — Discharge Instructions (Signed)
As discussed with Dr. Matilde Sprang  Use colace and Miralax as directed.   You may resume aspirin, advil, aleve, vitamins, and supplements 7 days after surgery.  Do not resume vaginal premarin cream until instructed to do so by Dr. Matilde Sprang.

## 2015-12-06 ENCOUNTER — Encounter (HOSPITAL_COMMUNITY): Payer: Self-pay | Admitting: Urology

## 2015-12-06 DIAGNOSIS — N8110 Cystocele, unspecified: Secondary | ICD-10-CM | POA: Diagnosis not present

## 2015-12-06 DIAGNOSIS — Z79899 Other long term (current) drug therapy: Secondary | ICD-10-CM | POA: Diagnosis not present

## 2015-12-06 DIAGNOSIS — I10 Essential (primary) hypertension: Secondary | ICD-10-CM | POA: Diagnosis not present

## 2015-12-06 DIAGNOSIS — F319 Bipolar disorder, unspecified: Secondary | ICD-10-CM | POA: Diagnosis not present

## 2015-12-06 DIAGNOSIS — Z90710 Acquired absence of both cervix and uterus: Secondary | ICD-10-CM | POA: Diagnosis not present

## 2015-12-06 DIAGNOSIS — E039 Hypothyroidism, unspecified: Secondary | ICD-10-CM | POA: Diagnosis not present

## 2015-12-06 LAB — HEMOGLOBIN AND HEMATOCRIT, BLOOD
HEMATOCRIT: 28.3 % — AB (ref 36.0–46.0)
Hemoglobin: 9.3 g/dL — ABNORMAL LOW (ref 12.0–15.0)

## 2015-12-06 LAB — BASIC METABOLIC PANEL
ANION GAP: 6 (ref 5–15)
BUN: 15 mg/dL (ref 6–20)
CALCIUM: 8.1 mg/dL — AB (ref 8.9–10.3)
CO2: 25 mmol/L (ref 22–32)
Chloride: 105 mmol/L (ref 101–111)
Creatinine, Ser: 0.98 mg/dL (ref 0.44–1.00)
GFR, EST NON AFRICAN AMERICAN: 56 mL/min — AB (ref >60)
GLUCOSE: 166 mg/dL — AB (ref 65–99)
POTASSIUM: 3.8 mmol/L (ref 3.5–5.1)
Sodium: 136 mmol/L (ref 135–145)

## 2015-12-06 NOTE — Discharge Summary (Signed)
Physician Discharge Summary  Patient ID: Theresa Underwood MRN: [id number] DOB/AGE: Feb 12, 1941 75 y.o.  Admit date: 12/05/2015 Discharge date: 12/06/2015  Admission Diagnoses: Cystocele  Discharge Diagnoses:  Active Problems:   Cystocele   Discharged Condition: good  Hospital Course:  75 yo female who is s/p cytocele repair and vaginal vault repair with Dr. Lorra Hals. She did well post-operatively. Her diet was slowly advanced and at the time of discharge she was tolerating a regular diet, ambulating at her baseline, was voiding spontaneously after foley catheter removal, vaginal packing had been removed and pain was well controlled with oral narcotics. She was discharged to home on POD#1.  Consults: None  Significant Diagnostic Studies: none  Treatments: surgery: cytocele repair and vaginal vault repair  Discharge Exam: Blood pressure 104/54, pulse 75, temperature 98 F (36.7 C), temperature source Oral, resp. rate 18, height 5\' 5"  (1.651 m), weight 66.225 kg (146 lb), SpO2 97 %. General: Alert and oriented. CV: RRR Lungs: Clear bilaterally. GI: Soft, Nondistended. GU: Vaginal packing removed. Foley out.  Extremities: Nontender, no erythema, no edema. Normal ROM.  Disposition: 01-Home or Self Care     Medication List    STOP taking these medications        ibuprofen 200 MG tablet  Commonly known as:  ADVIL,MOTRIN     PREMARIN vaginal cream  Generic drug:  conjugated estrogens      TAKE these medications        acetaminophen 500 MG tablet  Commonly known as:  TYLENOL  Take 500 mg by mouth as needed.     clonazePAM 0.5 MG tablet  Commonly known as:  KLONOPIN  Take 0.5 mg by mouth at bedtime.     docusate sodium 100 MG capsule  Commonly known as:  COLACE  Take 1 capsule (100 mg total) by mouth 2 (two) times daily.     ferrous sulfate 325 (65 FE) MG tablet  Take 325 mg by mouth daily with breakfast.     gabapentin 600 MG tablet  Commonly known as:   NEURONTIN  Take 1 tablet (600 mg total) by mouth 3 (three) times daily.     HYDROcodone-acetaminophen 5-325 MG tablet  Commonly known as:  NORCO  Take 1-2 tablets by mouth every 6 (six) hours as needed for moderate pain.     lamoTRIgine 200 MG tablet  Commonly known as:  LAMICTAL  Take 200 mg by mouth daily.     levothyroxine 112 MCG tablet  Commonly known as:  SYNTHROID, LEVOTHROID  Take 112 mcg by mouth at bedtime.     lisinopril 10 MG tablet  Commonly known as:  PRINIVIL,ZESTRIL  Take 10 mg by mouth 3 (three) times a week. Take on Monday, Wednesday, Friday     mupirocin ointment 2 %  Commonly known as:  BACTROBAN  apply to affected area three times a day for 10 days [place]       Follow-up Information    Follow up with MACDIARMID,SCOTT A, MD.   Specialty:  Urology   Why:  office will call you with date and time of appointment   Contact information:   [place] [address] [phone]       Signed: Acie Fredrickson 12/06/2015, 7:47 AM

## 2015-12-06 NOTE — Progress Notes (Signed)
1 Day Post-Op   Subjective: The patient is doing well.  No nausea or vomiting. Tolerating regular food. Pain is adequately controlled. No leg pain or weakness. No buttocks pain. Walking without difficulty. AF, VSS. Excellent UOP.  Objective: Vital signs in last 24 hours: Temp:  [96.7 F (35.9 C)-98.6 F (37 C)] 98 F (36.7 C) (06/21 0447) Pulse Rate:  [63-78] 75 (06/21 0447) Resp:  [10-18] 18 (06/21 0447) BP: (104-135)/(48-73) 104/54 mmHg (06/21 0447) SpO2:  [97 %-100 %] 97 % (06/21 0447) Weight:  [66.225 kg (146 lb)] 66.225 kg (146 lb) (06/21 0447)  Intake/Output from previous day: 06/20 0701 - 06/21 0700 In: 2670.8 [I.V.:2562.5; IV Piggyback:108.3] Out: 2300 [Urine:2280; Blood:20] Intake/Output this shift:    Physical Exam:  General: Alert and oriented. CV: RRR Lungs: Clear bilaterally. GI: Soft, Nondistended. GU: Vaginal packing was removed with minimal bleeding Urine: Clear yellow, foley catheter was removed at the bedside Extremities: Nontender, no erythema, no edema. Normal ROM.  Lab Results:  Recent Labs  12/04/15 0930 12/05/15 1626 12/06/15 0439  HGB 10.8* 10.3* 9.3*  HCT 32.0* 30.5* 28.3*      Assessment/Plan: POD#1 s/p cystocele repair with vaginal vault suspension with dermis graft.    1) Regular diet, medlock 2) Ambulate, Incentive spirometry 3) Transition to oral pain medication. 4) Remove vaginal packing and foley this AM 5) Follow up trial of void with PVR 6) Plan for likely discharge later this afternoon    Acie Fredrickson 12/06/2015, 7:27 AM

## 2015-12-06 NOTE — Progress Notes (Signed)
MD notified of PVR x 2, states to discharge pt home

## 2015-12-06 NOTE — Progress Notes (Signed)
Looks great No pain or minimal  Ambulating See orders for trial of void Detailed post op

## 2015-12-06 NOTE — Progress Notes (Signed)
Patient and her daughter given discharge, follow up and medication instructions, verbalized understanding, IV removed, family to transport home

## 2015-12-15 DIAGNOSIS — N3946 Mixed incontinence: Secondary | ICD-10-CM | POA: Diagnosis not present

## 2015-12-15 DIAGNOSIS — N8111 Cystocele, midline: Secondary | ICD-10-CM | POA: Diagnosis not present

## 2015-12-28 DIAGNOSIS — H1013 Acute atopic conjunctivitis, bilateral: Secondary | ICD-10-CM | POA: Diagnosis not present

## 2015-12-30 DIAGNOSIS — N3000 Acute cystitis without hematuria: Secondary | ICD-10-CM | POA: Diagnosis not present

## 2016-01-03 DIAGNOSIS — R3982 Chronic bladder pain: Secondary | ICD-10-CM | POA: Diagnosis not present

## 2016-01-26 ENCOUNTER — Telehealth: Payer: Self-pay | Admitting: Internal Medicine

## 2016-01-26 NOTE — Telephone Encounter (Signed)
Spoke with patient and she reports change in bowel color several days ago then nausea without vomiting then next day. She reports yesterday, she had upper and mid abdominal discomfort that got worse as the day went one. She took a Hydrocodone and this helped the pain but it returned last night. She took a home remedy of apple juice and apple cider and the pain was relieved. She reports a history of gallstones on ultrasound. She is worried because she is going out of the country in September. Wants to be seen and evaluated. Scheduled with Alonza Bogus, PA on 01/31/16 at 1:30 PM. Patient understands to go to ED or Urgent Care for increasing symptoms over weekend.

## 2016-01-31 ENCOUNTER — Encounter: Payer: Self-pay | Admitting: Gastroenterology

## 2016-01-31 ENCOUNTER — Ambulatory Visit (INDEPENDENT_AMBULATORY_CARE_PROVIDER_SITE_OTHER): Payer: Medicare Other | Admitting: Gastroenterology

## 2016-01-31 VITALS — Ht 64.75 in | Wt 146.5 lb

## 2016-01-31 DIAGNOSIS — K589 Irritable bowel syndrome without diarrhea: Secondary | ICD-10-CM

## 2016-01-31 DIAGNOSIS — R110 Nausea: Secondary | ICD-10-CM | POA: Diagnosis not present

## 2016-01-31 DIAGNOSIS — R1084 Generalized abdominal pain: Secondary | ICD-10-CM | POA: Diagnosis not present

## 2016-01-31 DIAGNOSIS — Z23 Encounter for immunization: Secondary | ICD-10-CM | POA: Diagnosis not present

## 2016-01-31 MED ORDER — HYOSCYAMINE SULFATE 0.125 MG SL SUBL: 0.1250 mg | SUBLINGUAL_TABLET | Freq: Four times a day (QID) | SUBLINGUAL | 2 refills | Status: DC | PRN

## 2016-01-31 NOTE — Patient Instructions (Signed)
We sent a prescription for Levsin SL 0.125 mg to [facility], [place]   Call us back if symptoms persist. [phone],  Ask for [place] or Bettyjo Lundblad.

## 2016-02-01 ENCOUNTER — Encounter: Payer: Self-pay | Admitting: Gastroenterology

## 2016-02-01 DIAGNOSIS — K589 Irritable bowel syndrome without diarrhea: Secondary | ICD-10-CM | POA: Insufficient documentation

## 2016-02-01 DIAGNOSIS — R110 Nausea: Secondary | ICD-10-CM | POA: Insufficient documentation

## 2016-02-01 DIAGNOSIS — R1084 Generalized abdominal pain: Secondary | ICD-10-CM | POA: Insufficient documentation

## 2016-02-01 NOTE — Progress Notes (Signed)
     01/31/2016 JARETSY LEDBETTER [id number] 1940-07-29   History of Present Illness:  "Theresa Underwood" is a pleasant 75 year old female who is known to Dr. Henrene Pastor.  She had both EGD and colonoscopy in February 2013 at which time colonoscopy revealed moderate diverticulosis with repeat recommended in 10 years from that time. EGD was essentially normal as well. Duodenal biopsies were obtained and were within normal limits with no evidence of some sprue or H. Pylori.  She presents to our office today with some nonspecific abdominal complaints. These symptoms have only been present over the past couple days.  She complains of mid to upper abdominal discomfort. Says that it has actually been better today but just still feels sore. Also complains of some nausea.  Has been experiencing some constipation as well. She talked extensively about being somewhat anxious for her upcoming trip to [place] and is worried and concerned that she will have GI symptoms interfering with her trip while she is there.  Just of note, she has battled iron deficiency anemia in the past but she tells me her most recent hemoglobin was 11.6 g.   Current Medications, Allergies, Past Medical History, Past Surgical History, Family History and Social History were reviewed in [facility] record.   Physical Exam: BP (P) 128/80 (BP Location: Left Arm, Patient Position: Sitting, Cuff Size: Normal)   Pulse (P) 74   Ht 5' 4.75" (1.645 m)   Wt 146 lb 8 oz (66.5 kg)   BMI 24.57 kg/m  General: Well developed white female in no acute distress Head: Normocephalic and atraumatic Eyes:  Sclerae anicteric, conjunctiva pink  Ears: Normal auditory acuity Lungs: Clear throughout to auscultation Heart: Regular rate and rhythm Abdomen: Soft, non-distended.  Normal bowel sounds.  Minimal diffuse TTP. Musculoskeletal: Symmetrical with no gross deformities  Extremities: No edema  Neurological: Alert oriented x 4, grossly  non-focal Psychological:  Alert and cooperative. Normal mood and affect  Assessment and Recommendations: -75 year old female with nonspecific complaints of generalized/midabdominal pain associated with nausea and some constipation that has all just been present over the past couple of days. She does talk about being nervous for an upcoming trip to [place]. I am curious if her symptoms are all functional and irritable bowel/nerve related.  She is going to begin taking a daily probiotic and some MiraLAX daily.  I will also give her a prescription of Levsin to use on an as-needed basis for abdominal discomfort. She will call back within the next week or 2 if her symptoms persist and of course if they worsen at which time we can consider CT scan, but I don't think we need to proceed with that at this time.

## 2016-02-05 NOTE — Progress Notes (Signed)
Agree with initial assessment and plans as outlined 

## 2016-02-07 DIAGNOSIS — R3982 Chronic bladder pain: Secondary | ICD-10-CM | POA: Diagnosis not present

## 2016-02-07 DIAGNOSIS — N3000 Acute cystitis without hematuria: Secondary | ICD-10-CM | POA: Diagnosis not present

## 2016-02-13 DIAGNOSIS — N8111 Cystocele, midline: Secondary | ICD-10-CM | POA: Diagnosis not present

## 2016-02-13 DIAGNOSIS — N3000 Acute cystitis without hematuria: Secondary | ICD-10-CM | POA: Diagnosis not present

## 2016-04-09 DIAGNOSIS — N8110 Cystocele, unspecified: Secondary | ICD-10-CM | POA: Diagnosis not present

## 2016-04-09 DIAGNOSIS — M8580 Other specified disorders of bone density and structure, unspecified site: Secondary | ICD-10-CM | POA: Diagnosis not present

## 2016-04-09 DIAGNOSIS — Z13820 Encounter for screening for osteoporosis: Secondary | ICD-10-CM | POA: Diagnosis not present

## 2016-04-09 DIAGNOSIS — Z01411 Encounter for gynecological examination (general) (routine) with abnormal findings: Secondary | ICD-10-CM | POA: Diagnosis not present

## 2016-04-09 DIAGNOSIS — Z6824 Body mass index (BMI) 24.0-24.9, adult: Secondary | ICD-10-CM | POA: Diagnosis not present

## 2016-06-03 DIAGNOSIS — N3000 Acute cystitis without hematuria: Secondary | ICD-10-CM | POA: Diagnosis not present

## 2016-06-03 DIAGNOSIS — N3946 Mixed incontinence: Secondary | ICD-10-CM | POA: Diagnosis not present

## 2016-06-04 DIAGNOSIS — H1013 Acute atopic conjunctivitis, bilateral: Secondary | ICD-10-CM | POA: Diagnosis not present

## 2016-06-09 DIAGNOSIS — N3000 Acute cystitis without hematuria: Secondary | ICD-10-CM | POA: Diagnosis not present

## 2016-06-18 DIAGNOSIS — H10413 Chronic giant papillary conjunctivitis, bilateral: Secondary | ICD-10-CM | POA: Diagnosis not present

## 2016-07-23 DIAGNOSIS — M8580 Other specified disorders of bone density and structure, unspecified site: Secondary | ICD-10-CM | POA: Diagnosis not present

## 2016-08-08 DIAGNOSIS — D649 Anemia, unspecified: Secondary | ICD-10-CM | POA: Diagnosis not present

## 2016-08-08 DIAGNOSIS — E538 Deficiency of other specified B group vitamins: Secondary | ICD-10-CM | POA: Diagnosis not present

## 2016-08-08 DIAGNOSIS — E038 Other specified hypothyroidism: Secondary | ICD-10-CM | POA: Diagnosis not present

## 2016-08-08 DIAGNOSIS — E7439 Other disorders of intestinal carbohydrate absorption: Secondary | ICD-10-CM | POA: Diagnosis not present

## 2016-08-08 DIAGNOSIS — I10 Essential (primary) hypertension: Secondary | ICD-10-CM | POA: Diagnosis not present

## 2016-08-08 DIAGNOSIS — R8299 Other abnormal findings in urine: Secondary | ICD-10-CM | POA: Diagnosis not present

## 2016-08-08 DIAGNOSIS — E784 Other hyperlipidemia: Secondary | ICD-10-CM | POA: Diagnosis not present

## 2016-08-08 DIAGNOSIS — Z0000 Encounter for general adult medical examination without abnormal findings: Secondary | ICD-10-CM | POA: Diagnosis not present

## 2016-08-22 ENCOUNTER — Other Ambulatory Visit: Payer: Self-pay | Admitting: Obstetrics and Gynecology

## 2016-08-22 DIAGNOSIS — Z1231 Encounter for screening mammogram for malignant neoplasm of breast: Secondary | ICD-10-CM

## 2016-08-28 DIAGNOSIS — Z1212 Encounter for screening for malignant neoplasm of rectum: Secondary | ICD-10-CM | POA: Diagnosis not present

## 2016-09-03 DIAGNOSIS — M1990 Unspecified osteoarthritis, unspecified site: Secondary | ICD-10-CM | POA: Diagnosis not present

## 2016-09-10 ENCOUNTER — Ambulatory Visit
Admission: RE | Admit: 2016-09-10 | Discharge: 2016-09-10 | Disposition: A | Discharge status: Home / Self Care | Payer: Medicare Other | Source: Ambulatory Visit | Attending: Obstetrics and Gynecology | Admitting: Obstetrics and Gynecology

## 2016-09-10 DIAGNOSIS — M1990 Unspecified osteoarthritis, unspecified site: Secondary | ICD-10-CM | POA: Diagnosis not present

## 2016-09-10 DIAGNOSIS — Z1231 Encounter for screening mammogram for malignant neoplasm of breast: Secondary | ICD-10-CM | POA: Diagnosis not present

## 2016-09-17 DIAGNOSIS — M1990 Unspecified osteoarthritis, unspecified site: Secondary | ICD-10-CM | POA: Diagnosis not present

## 2016-10-01 DIAGNOSIS — M1990 Unspecified osteoarthritis, unspecified site: Secondary | ICD-10-CM | POA: Diagnosis not present

## 2016-10-15 DIAGNOSIS — M1990 Unspecified osteoarthritis, unspecified site: Secondary | ICD-10-CM | POA: Diagnosis not present

## 2016-10-22 DIAGNOSIS — M1990 Unspecified osteoarthritis, unspecified site: Secondary | ICD-10-CM | POA: Diagnosis not present

## 2016-10-29 DIAGNOSIS — M1990 Unspecified osteoarthritis, unspecified site: Secondary | ICD-10-CM | POA: Diagnosis not present

## 2016-11-07 ENCOUNTER — Encounter: Payer: Self-pay | Admitting: Neurology

## 2016-11-07 ENCOUNTER — Ambulatory Visit (INDEPENDENT_AMBULATORY_CARE_PROVIDER_SITE_OTHER): Payer: Medicare Other | Admitting: Neurology

## 2016-11-07 ENCOUNTER — Ambulatory Visit: Payer: Medicare Other | Admitting: Neurology

## 2016-11-07 VITALS — BP 100/60 | HR 70 | Ht 65.5 in | Wt 151.2 lb

## 2016-11-07 DIAGNOSIS — G629 Polyneuropathy, unspecified: Secondary | ICD-10-CM | POA: Diagnosis not present

## 2016-11-07 DIAGNOSIS — G3184 Mild cognitive impairment, so stated: Secondary | ICD-10-CM | POA: Diagnosis not present

## 2016-11-07 DIAGNOSIS — M1990 Unspecified osteoarthritis, unspecified site: Secondary | ICD-10-CM | POA: Diagnosis not present

## 2016-11-07 MED ORDER — GABAPENTIN 600 MG PO TABS: 600.0000 mg | ORAL_TABLET | Freq: Three times a day (TID) | ORAL | 3 refills | Status: DC

## 2016-11-07 NOTE — Patient Instructions (Signed)
Return to clinic in 1 year.

## 2016-11-07 NOTE — Progress Notes (Signed)
Follow-up Visit   Date: 11/07/16    [name] MRN: [id number] DOB: 1940-10-26   Interim History: Theresa Underwood is a 76 y.o. right-handed Caucasian female with history of hyperlipidemia, hypertension, hypothyroidism, bipolar affective disorder s/p ECT, borderline diabetes type 2, lumbar spinal stenosis, here for follow-up of hereditary peripheral neuropathy.  The patient was accompanied to the clinic by self.  History of present illness: In the early 1990s, she developed gradual onset of numbness involving the tips of her toes, described as a tight sensation over the feet. Over the years, her symptoms have progressed and now she gets numbness and prickly sensation over her lower legs (below the knee) and into her feet. Symptoms are worse when she rests and alleviated with neurontin. Of late, she has developed cold sensation of her feet and often puts them in a warm bath which helps. She takes neurontin 600mg  TID (8am, 1pm, 12am) and and lamictal 150mg  (for depression). She denies any fall and is ambulating independently. She has intermittent pain and tinging of the hands, but denies any weakness. Previously tried medications include metanx, Cymbalta, and Lyrica. She also has hammertoes and history of similar symptoms involving her mother, sister, father, and maternal grandfather. None of her family members were wheelchair-bound. Her mother has numbness/tingling of the feet and ambulated independently until late 80s then transitioned to a cane/walker due to spinal stenosis.   She was initially under the care of Dr. Parks Neptune at [facility] who diagnosed her with hereditary peripheral neuropathy in 1992 and was until his care until 1995 at which time he left the practice and transitioned care to Dr. Erling Cruz and since his retirement was last seen by Dr. Krista Blue in May 2014. Her last clinic note dated 11/05/2012 was reviewed and summarized as follows:  Initial EMG in July 1996 and January 1998 showed sensory  and motor polyneuropathy with predominantly demyelinating features. Subsequent electrodiagnostic studies in August 2001 and December 2007 was more consistent with "an axonal 'small fiber' peripheral neuropathy". There was no evidence of a lumbosacral radiculopathy.   EMG of the arms shows cervical radiculopathy affecting right C5, left C7, and bilateral T1 nerve roots.  There is no evidence of neuropathy or CTS.  Her neuropathic pain is well-controlled neurontin 600mg  TID.    In 2016, she started having memory issues, especially with short-term recall, such as trying to remember a telephone number.  She is very active and does her own IADLs. No wording finding problems, but has been told that she repeats herself.  She has a history of bipolar for which she takes lamictal.  Denies any depressive symptoms.   - UPDATE 11/09/2015:  She has not noticed any interval worsening of her neuropathy.  She continues to take gabapentin 600mg  TID which provides significant relief of painful paresthesias.  She had one fall over the past year while tripping over a stack of cardboard boxes.  She continues to walk independently.    UPDATE 11/07/2016:  She is here for 1 year appointment.  Last week, she became alarmed because she developed burning and hot sensation of the feet to the level of the ankles when she woke up in the morning, which lasted a few hours but improved as the day went on.  She became very anxious about her symptoms and almost had a panic attack.  She had has three spells of burning sensation of the feet, which has not persisted.  Prior to this time, her pain is well-controlled on  gabapentin 600mg  TID.  She still notices some issues with forgetfullness, but continues to stay very active. She stays busy babysitting and works part-time at [employer].  She manages her own medication, finances, and drives. She does not wish to do cognitive screening in the office.  She endorses worrying about things and  has a high level of anxiety at all times.  She seeing a psychiatrist who addresses her bipolar disorder which has been stable.   Medications:  Current Outpatient Prescriptions on File Prior to Visit  Medication Sig Dispense Refill  . acetaminophen (TYLENOL) 500 MG tablet Take 500 mg by mouth as needed.    . clonazePAM (KLONOPIN) 0.5 MG tablet Take 0.5 mg by mouth at bedtime.    . docusate sodium (COLACE) 100 MG capsule Take 1 capsule (100 mg total) by mouth 2 (two) times daily.    Marland Kitchen HYDROcodone-acetaminophen (NORCO) 5-325 MG tablet Take 1-2 tablets by mouth every 6 (six) hours as needed for moderate pain. 30 tablet 0  . hyoscyamine (LEVSIN/SL) 0.125 MG SL tablet Place 1 tablet (0.125 mg total) under the tongue every 6 (six) hours as needed. 30 tablet 2  . lamoTRIgine (LAMICTAL) 200 MG tablet Take 200 mg by mouth daily.    Marland Kitchen levothyroxine (SYNTHROID, LEVOTHROID) 112 MCG tablet Take 112 mcg by mouth at bedtime. Brand name only    . lisinopril (PRINIVIL,ZESTRIL) 10 MG tablet Take 10 mg by mouth 3 (three) times a week. Take on Monday, Wednesday, Friday    . mupirocin ointment (BACTROBAN) 2 % apply to affected area three times a day for 10 days WHEN HAS ACTIVE INFECTIONS  0   No current facility-administered medications on file prior to visit.     Allergies:  Allergies  Allergen Reactions  . Carbamazepine     REACTION: hives with tegretol  . Sulfonamide Derivatives     REACTION: hives     Review of Systems:  CONSTITUTIONAL: No fevers, chills, night sweats, or weight loss.   EYES: No visual changes or eye pain ENT: No hearing changes.  No history of nose bleeds.   RESPIRATORY: No cough, wheezing and shortness of breath.   CARDIOVASCULAR: Negative for chest pain, and palpitations.   GI: Negative for abdominal discomfort, blood in stools or black stools.  No recent change in bowel habits.   GU:  No history of incontinence.   MUSCLOSKELETAL: +history of joint pain or swelling.  No  myalgias.   SKIN: Negative for lesions, rash, and itching.   ENDOCRINE: Negative for cold or heat intolerance, polydipsia or goiter.   PSYCH:  No depression +anxiety symptoms.   NEURO: As Above.   Vital Signs:  BP 100/60   Pulse 70   Ht 5' 5.5" (1.664 m)   Wt 151 lb 3 oz (68.6 kg)   SpO2 97%   BMI 24.78 kg/m   Neurological Exam:    MENTAL STATUS including orientation to time, place, person, recent and remote memory, attention span and concentration, language, and fund of knowledge is normal.  Speech is not dysarthric.  MOCA refused  CRANIAL NERVES:  Pupils equal round and reactive to light.  Normal conjugate, extra-ocular eye movements in all directions of gaze.  Subtle right ptosis. Face is symmetric.  Tongue is midline.  MOTOR:  Motor strength is 5/5 in all extremities, except interosseus muscles 5-/5, ABP 4+/5, and toe extensors 5-/5 bilaterally.  Tone is normal.   MSRs:    REFLEXES:  Reflexes are 3+/4 throughout, except  absent Achilles bilaterally.  SENSORY:  Vibration absent at great toe bilaterally, pin prick is reduced mid-calf bilaterally.  COORDINATION/GAIT:  Gait narrow based and stable. Mildly unsteady with tandem gait.  Data: EMG 10/25/2013:  There is electrophysiological evidence of a multilevel cervical radiculopathy affecting the right C5, left C7, and bilateral T1 nerve roots/segment, overall mild-to-moderate degree electrically. There is no evidence generalized sensorimotor polyneuropathy or carpal tunnel syndrome affecting the upper extremities.  Labs 10/05/2013:  Vitamin B12 291*, SPEP/UPEP with IFE - no M protein, copper 139  Lab Results  Component Value Date   [id number] 05/06/2014   Labs: HbA1c 6.4 (10/13/2012), TSH 1.62 (03/2013), Cr 0.9 (03/2013)   MRI of the lumbar spine without contrast 12/07/2004: Multilevel spondylosis, advanced at L3-4 L5-S1, and moderate to mild spondylosis L3-4 and mild spinal stenosis at L4-5. An osteophyte at L5-S1 might  encroach the extraforaminal portion of the left L5 nerve root. There is no disc herniation.   CT myelogram 07/01/2006:  Multilevel disk degeneration. Disk bulges and mild spurring at L1-2 and L2-3. Moderate to advanced disk degeneration at L3-4 with spondylotic changes, especially on the right. There is lateral recess stenosis on the right and mild L4 impingement on the right.  Disk bulging and facet arthropathy at L4-5.      IMPRESSION/PLAN: 1.  Peripheral neuropathy affecting bilateral lower extremities, mild   - Strong family history of neuropathy, likely hereditary (CMT1A?)  - Clinically stable with no involvement of the upper extremities  - Discussed the natural course of neuropathy that it is slowly progressive  - Continue neurontin 600mg  TID - refill  - She can use Aspercream as needed to feet for acute pain spells  2.  Mild cognitive impairment, stable and most likely contributed by anxiety.    - Follow clinically  - Encouraged to continue to stay active, engage in mentally stimulating activities, and start an exercise program  3.  Bipolar disorder, on lamictal   Return to clinic in 1 year or sooner as needed   The duration of this appointment visit was 25 minutes of face-to-face time with the patient.  Greater than 50% of this time was spent in counseling, explanation of diagnosis, planning of further management, and coordination of care.   Thank you for allowing me to participate in patient's care.  If I can answer any additional questions, I would be pleased to do so.    Sincerely,    Aeva Posey K. Posey Pronto, DO

## 2016-12-24 DIAGNOSIS — N3020 Other chronic cystitis without hematuria: Secondary | ICD-10-CM | POA: Diagnosis not present

## 2017-01-10 DIAGNOSIS — L814 Other melanin hyperpigmentation: Secondary | ICD-10-CM | POA: Diagnosis not present

## 2017-03-11 DIAGNOSIS — H2513 Age-related nuclear cataract, bilateral: Secondary | ICD-10-CM | POA: Diagnosis not present

## 2017-03-11 DIAGNOSIS — G6289 Other specified polyneuropathies: Secondary | ICD-10-CM | POA: Diagnosis not present

## 2017-03-11 DIAGNOSIS — N183 Chronic kidney disease, stage 3 (moderate): Secondary | ICD-10-CM | POA: Diagnosis not present

## 2017-03-11 DIAGNOSIS — F3189 Other bipolar disorder: Secondary | ICD-10-CM | POA: Diagnosis not present

## 2017-03-11 DIAGNOSIS — E538 Deficiency of other specified B group vitamins: Secondary | ICD-10-CM | POA: Diagnosis not present

## 2017-03-11 DIAGNOSIS — E038 Other specified hypothyroidism: Secondary | ICD-10-CM | POA: Diagnosis not present

## 2017-03-11 DIAGNOSIS — I129 Hypertensive chronic kidney disease with stage 1 through stage 4 chronic kidney disease, or unspecified chronic kidney disease: Secondary | ICD-10-CM | POA: Diagnosis not present

## 2017-03-11 DIAGNOSIS — Z6825 Body mass index (BMI) 25.0-25.9, adult: Secondary | ICD-10-CM | POA: Diagnosis not present

## 2017-03-11 DIAGNOSIS — E663 Overweight: Secondary | ICD-10-CM | POA: Diagnosis not present

## 2017-03-11 DIAGNOSIS — E7439 Other disorders of intestinal carbohydrate absorption: Secondary | ICD-10-CM | POA: Diagnosis not present

## 2017-03-11 DIAGNOSIS — G3184 Mild cognitive impairment, so stated: Secondary | ICD-10-CM | POA: Diagnosis not present

## 2017-04-17 DIAGNOSIS — R3915 Urgency of urination: Secondary | ICD-10-CM | POA: Diagnosis not present

## 2017-04-17 DIAGNOSIS — R351 Nocturia: Secondary | ICD-10-CM | POA: Diagnosis not present

## 2017-04-17 DIAGNOSIS — N8110 Cystocele, unspecified: Secondary | ICD-10-CM | POA: Diagnosis not present

## 2017-04-17 DIAGNOSIS — Z6826 Body mass index (BMI) 26.0-26.9, adult: Secondary | ICD-10-CM | POA: Diagnosis not present

## 2017-04-17 DIAGNOSIS — M8580 Other specified disorders of bone density and structure, unspecified site: Secondary | ICD-10-CM | POA: Diagnosis not present

## 2017-04-17 DIAGNOSIS — K580 Irritable bowel syndrome with diarrhea: Secondary | ICD-10-CM | POA: Diagnosis not present

## 2017-04-17 DIAGNOSIS — Z01411 Encounter for gynecological examination (general) (routine) with abnormal findings: Secondary | ICD-10-CM | POA: Diagnosis not present

## 2017-04-17 DIAGNOSIS — N952 Postmenopausal atrophic vaginitis: Secondary | ICD-10-CM | POA: Diagnosis not present

## 2017-04-29 DIAGNOSIS — M549 Dorsalgia, unspecified: Secondary | ICD-10-CM | POA: Diagnosis not present

## 2017-04-29 DIAGNOSIS — Z6826 Body mass index (BMI) 26.0-26.9, adult: Secondary | ICD-10-CM | POA: Diagnosis not present

## 2017-06-24 DIAGNOSIS — N3000 Acute cystitis without hematuria: Secondary | ICD-10-CM | POA: Diagnosis not present

## 2017-07-16 DIAGNOSIS — N952 Postmenopausal atrophic vaginitis: Secondary | ICD-10-CM | POA: Diagnosis not present

## 2017-07-16 DIAGNOSIS — Z87440 Personal history of urinary (tract) infections: Secondary | ICD-10-CM | POA: Diagnosis not present

## 2017-07-16 DIAGNOSIS — R159 Full incontinence of feces: Secondary | ICD-10-CM | POA: Diagnosis not present

## 2017-07-16 DIAGNOSIS — N3941 Urge incontinence: Secondary | ICD-10-CM | POA: Diagnosis not present

## 2017-07-16 DIAGNOSIS — N814 Uterovaginal prolapse, unspecified: Secondary | ICD-10-CM | POA: Diagnosis not present

## 2017-08-13 DIAGNOSIS — E038 Other specified hypothyroidism: Secondary | ICD-10-CM | POA: Diagnosis not present

## 2017-08-13 DIAGNOSIS — E538 Deficiency of other specified B group vitamins: Secondary | ICD-10-CM | POA: Diagnosis not present

## 2017-08-13 DIAGNOSIS — R82998 Other abnormal findings in urine: Secondary | ICD-10-CM | POA: Diagnosis not present

## 2017-08-13 DIAGNOSIS — I10 Essential (primary) hypertension: Secondary | ICD-10-CM | POA: Diagnosis not present

## 2017-08-13 DIAGNOSIS — E7849 Other hyperlipidemia: Secondary | ICD-10-CM | POA: Diagnosis not present

## 2017-08-13 DIAGNOSIS — R7301 Impaired fasting glucose: Secondary | ICD-10-CM | POA: Diagnosis not present

## 2017-08-19 DIAGNOSIS — R82998 Other abnormal findings in urine: Secondary | ICD-10-CM | POA: Diagnosis not present

## 2017-08-20 DIAGNOSIS — Z1212 Encounter for screening for malignant neoplasm of rectum: Secondary | ICD-10-CM | POA: Diagnosis not present

## 2017-09-24 ENCOUNTER — Other Ambulatory Visit: Payer: Self-pay | Admitting: Obstetrics and Gynecology

## 2017-09-24 DIAGNOSIS — Z1231 Encounter for screening mammogram for malignant neoplasm of breast: Secondary | ICD-10-CM

## 2017-10-21 ENCOUNTER — Ambulatory Visit
Admission: RE | Admit: 2017-10-21 | Discharge: 2017-10-21 | Disposition: A | Discharge status: Home / Self Care | Payer: Medicare Other | Source: Ambulatory Visit | Attending: Obstetrics and Gynecology | Admitting: Obstetrics and Gynecology

## 2017-10-21 DIAGNOSIS — Z1231 Encounter for screening mammogram for malignant neoplasm of breast: Secondary | ICD-10-CM | POA: Diagnosis not present

## 2017-11-07 ENCOUNTER — Encounter: Payer: Self-pay | Admitting: Neurology

## 2017-11-07 ENCOUNTER — Ambulatory Visit (INDEPENDENT_AMBULATORY_CARE_PROVIDER_SITE_OTHER): Payer: Medicare Other | Admitting: Neurology

## 2017-11-07 VITALS — BP 100/68 | HR 74 | Ht 65.0 in | Wt 154.2 lb

## 2017-11-07 DIAGNOSIS — G3184 Mild cognitive impairment, so stated: Secondary | ICD-10-CM | POA: Diagnosis not present

## 2017-11-07 DIAGNOSIS — G629 Polyneuropathy, unspecified: Secondary | ICD-10-CM | POA: Diagnosis not present

## 2017-11-07 MED ORDER — GABAPENTIN 600 MG PO TABS: 600.0000 mg | ORAL_TABLET | Freq: Three times a day (TID) | ORAL | 3 refills | Status: DC

## 2017-11-07 NOTE — Patient Instructions (Signed)
Continue gabapentin 600mg  three times daily  Follow-up with podiatrist or your primary care doctor  Return to clinic in 1 year

## 2017-11-07 NOTE — Progress Notes (Signed)
Follow-up Visit   Date: 11/07/17    Theresa Underwood MRN: [id number] DOB: Mar 13, 1941   Interim History: Theresa Underwood is a 77 y.o. right-handed Caucasian female with history of hyperlipidemia, hypertension, hypothyroidism, bipolar affective disorder s/p ECT, borderline diabetes type 2, lumbar spinal stenosis, here for follow-up of hereditary peripheral neuropathy.  The patient was accompanied to the clinic by self.  History of present illness: In the early 1990s, she developed gradual onset of numbness involving the tips of her toes, described as a tight sensation over the feet. Over the years, her symptoms have progressed and now she gets numbness and prickly sensation over her lower legs (below the knee) and into her feet. Symptoms are worse when she rests and alleviated with neurontin. Of late, she has developed cold sensation of her feet and often puts them in a warm bath which helps. She takes neurontin 600mg  TID (8am, 1pm, 12am) and and lamictal 150mg  (for depression). She denies any fall and is ambulating independently. She has intermittent pain and tinging of the hands, but denies any weakness. Previously tried medications include metanx, Cymbalta, and Lyrica. She also has hammertoes and history of similar symptoms involving her mother, sister, father, and maternal grandfather. None of her family members were wheelchair-bound. Her mother has numbness/tingling of the feet and ambulated independently until late 80s then transitioned to a cane/walker due to spinal stenosis.   She was initially under the care of Dr. Parks Neptune at [facility] who diagnosed her with hereditary peripheral neuropathy in 1992 and was until his care until 1995 at which time he left the practice and transitioned care to Dr. Erling Cruz and since his retirement was last seen by Dr. Krista Blue in May 2014. Her last clinic note dated 11/05/2012 was reviewed and summarized as follows:  Initial EMG in July 1996 and January 1998 showed sensory  and motor polyneuropathy with predominantly demyelinating features. Subsequent electrodiagnostic studies in August 2001 and December 2007 was more consistent with "an axonal 'small fiber' peripheral neuropathy". There was no evidence of a lumbosacral radiculopathy.   EMG of the arms shows cervical radiculopathy affecting right C5, left C7, and bilateral T1 nerve roots.  There is no evidence of neuropathy or CTS.  Her neuropathic pain is well-controlled neurontin 600mg  TID.    In 2016, she started having memory issues, especially with short-term recall, such as trying to remember a telephone number.  She is very active and does her own IADLs. No wording finding problems, but has been told that she repeats herself.  She has a history of bipolar for which she takes lamictal.  Denies any depressive symptoms.   UPDATE 11/07/2017:  She is here for 1 year appointment.  Over the past year, she has noticed that her neuropathy has slowly got worse, especially on the left which may also be stemming from chronic low back pain.  She continues to get adequate relief with gabapentin 600mg  TID.  No new changes with her memory.  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  She suffered one fall while climbing a ladder and did not injure herself.  She complains of new right toe discoloration and is concerned if it is due to neuropathy.  Medications:  Current Outpatient Medications on File Prior to Visit  Medication Sig Dispense Refill  . acetaminophen (TYLENOL) 500 MG tablet Take 500 mg by mouth as needed.    Marland Kitchen  Albuterol Sulfate (PROAIR RESPICLICK) 132 (90 Base) MCG/ACT AEPB ProAir RespiClick 90 mcg/actuation breath activated  inhale 2 puffs every 4 to 6 hours for cough    . cephALEXin (KEFLEX) 250 MG capsule take 1 capsule by mouth once daily for 30 DAYS  1  . clobetasol cream (TEMOVATE) 0.05 % clobetasol 0.05 % topical  cream    . clonazePAM (KLONOPIN) 0.5 MG tablet Take 0.5 mg by mouth at bedtime.    . docusate sodium (COLACE) 100 MG capsule Take 1 capsule (100 mg total) by mouth 2 (two) times daily.    Marland Kitchen HYDROcodone-acetaminophen (NORCO) 5-325 MG tablet Take 1-2 tablets by mouth every 6 (six) hours as needed for moderate pain. 30 tablet 0  . hyoscyamine (LEVSIN/SL) 0.125 MG SL tablet Place 1 tablet (0.125 mg total) under the tongue every 6 (six) hours as needed. 30 tablet 2  . lamoTRIgine (LAMICTAL) 200 MG tablet Take 200 mg by mouth daily.    Marland Kitchen levothyroxine (SYNTHROID, LEVOTHROID) 112 MCG tablet Take 112 mcg by mouth at bedtime. Brand name only    . lisinopril (PRINIVIL,ZESTRIL) 10 MG tablet Take 10 mg by mouth 3 (three) times a week. Take on Monday, Wednesday, Friday    . Multiple Vitamin (MULTI-VITAMINS) TABS Take by mouth.    . mupirocin ointment (BACTROBAN) 2 % apply to affected area three times a day for 10 days WHEN HAS ACTIVE INFECTIONS  0   No current facility-administered medications on file prior to visit.     Allergies:  Allergies  Allergen Reactions  . Sulfa Antibiotics Hives  . Carbamazepine     REACTION: hives with tegretol  . Sulfonamide Derivatives     REACTION: hives     Review of Systems:  CONSTITUTIONAL: No fevers, chills, night sweats, or weight loss.   EYES: No visual changes or eye pain ENT: No hearing changes.  No history of nose bleeds.   RESPIRATORY: No cough, wheezing and shortness of breath.   CARDIOVASCULAR: Negative for chest pain, and palpitations.   GI: Negative for abdominal discomfort, blood in stools or black stools.  No recent change in bowel habits.   GU:  No history of incontinence.   MUSCLOSKELETAL: +history of joint pain or swelling.  No myalgias.   SKIN: Negative for lesions, rash, and itching.   ENDOCRINE: Negative for cold or heat intolerance, polydipsia or goiter.   PSYCH:  No depression +anxiety symptoms.   NEURO: As Above.   Vital Signs:  BP  100/68   Pulse 74   Ht 5\' 5"  (1.651 m)   Wt 154 lb 4 oz (70 kg)   SpO2 97%   BMI 25.67 kg/m   General Medical Exam:   General:  Well appearing, comfortable  Neck:  No carotid bruits. Respiratory:  Clear to auscultation, good air entry bilaterally.   Cardiac:  Regular rate and rhythm, no murmur.   Ext:  Right great toe with onychomycosis  Neurological Exam:    MENTAL STATUS including orientation to time, place, person, recent and remote memory, attention span and concentration, language, and fund of knowledge is normal.  Speech is not dysarthric.  Montreal Cognitive Assessment  11/07/2017 11/10/2014  Visuospatial/ Executive (0/5) 5 5  Naming (0/3) 2 2  Attention: Read list of digits (0/2) 2 2  Attention: Read list of letters (0/1) 1 1  Attention: Serial 7 subtraction starting at 100 (0/3) 3 3  Language: Repeat phrase (0/2) 2 2  Language : Fluency (0/1) 1 0  Abstraction (  0/2) 2 2  Delayed Recall (0/5) 3 4  Orientation (0/6) 6 6  Total 27 27  Adjusted Score (based on education) 27 -    CRANIAL NERVES:  Pupils equal round and reactive to light.  Normal conjugate, extra-ocular eye movements in all directions of gaze.  Subtle right ptosis. Face is symmetric.  Tongue is midline.  MOTOR:  Motor strength is 5/5 in all extremities, except interosseus muscles 5-/5, ABP 4+/5, and toe extensors 5-/5 bilaterally.  Tone is normal.   MSRs:    REFLEXES:  Reflexes are 3+/4 throughout, except absent Achilles bilaterally.  SENSORY:  Vibration absent at great toe bilaterally, pin prick is reduced mid-calf bilaterally.  COORDINATION/GAIT:  Gait narrow based and stable. Mildly unsteady with tandem gait.  Data: EMG 10/25/2013:  There is electrophysiological evidence of a multilevel cervical radiculopathy affecting the right C5, left C7, and bilateral T1 nerve roots/segment, overall mild-to-moderate degree electrically. There is no evidence generalized sensorimotor polyneuropathy or carpal tunnel  syndrome affecting the upper extremities.  Labs 10/05/2013:  Vitamin B12 291*, SPEP/UPEP with IFE - no M protein, copper 139  MRI of the lumbar spine without contrast 12/07/2004: Multilevel spondylosis, advanced at L3-4 L5-S1, and moderate to mild spondylosis L3-4 and mild spinal stenosis at L4-5. An osteophyte at L5-S1 might encroach the extraforaminal portion of the left L5 nerve root. There is no disc herniation.   CT myelogram 07/01/2006:  Multilevel disk degeneration. Disk bulges and mild spurring at L1-2 and L2-3. Moderate to advanced disk degeneration at L3-4 with spondylotic changes, especially on the right. There is lateral recess stenosis on the right and mild L4 impingement on the right.  Disk bulging and facet arthropathy at L4-5.      IMPRESSION/PLAN: 1.  Peripheral neuropathy affecting bilateral lower extremities, mild.  Exam is stable and unchanged from last year with distal sensory loss.  - Strong family history of neuropathy, likely hereditary (CMT1A?)  - Continue neurontin 600mg  TID which adequately controls pain - refill provided for 1 year  - Fall precautions discussed, carry a collapsible cane for uneven surfaces   2.  Mild cognitive impairment, stable and most likely contributed by anxiety.  Her MOCA exam has been stable for 2+ years. Reassured patient that there is no signs of dementia.    3.  Right onychomycosis, follow-up with podiatry  Return to clinic in 1 year  Greater than 50% of this 30 minute visit was spent in counseling, explanation of diagnosis, planning of further management, and coordination of care.   Thank you for allowing me to participate in patient's care.  If I can answer any additional questions, I would be pleased to do so.    Sincerely,    Donika K. Posey Pronto, DO

## 2017-11-29 DIAGNOSIS — R319 Hematuria, unspecified: Secondary | ICD-10-CM | POA: Diagnosis not present

## 2017-12-08 DIAGNOSIS — R399 Unspecified symptoms and signs involving the genitourinary system: Secondary | ICD-10-CM | POA: Diagnosis not present

## 2017-12-17 DIAGNOSIS — N390 Urinary tract infection, site not specified: Secondary | ICD-10-CM | POA: Diagnosis not present

## 2017-12-17 DIAGNOSIS — N8111 Cystocele, midline: Secondary | ICD-10-CM | POA: Diagnosis not present

## 2018-01-09 DIAGNOSIS — T1501XA Foreign body in cornea, right eye, initial encounter: Secondary | ICD-10-CM | POA: Diagnosis not present

## 2018-02-20 ENCOUNTER — Encounter: Payer: Self-pay | Admitting: Neurology

## 2018-04-08 DIAGNOSIS — J069 Acute upper respiratory infection, unspecified: Secondary | ICD-10-CM | POA: Diagnosis not present

## 2018-04-08 DIAGNOSIS — Z6824 Body mass index (BMI) 24.0-24.9, adult: Secondary | ICD-10-CM | POA: Diagnosis not present

## 2018-04-08 DIAGNOSIS — R05 Cough: Secondary | ICD-10-CM | POA: Diagnosis not present

## 2018-04-14 DIAGNOSIS — H2513 Age-related nuclear cataract, bilateral: Secondary | ICD-10-CM | POA: Diagnosis not present

## 2018-04-20 DIAGNOSIS — K589 Irritable bowel syndrome without diarrhea: Secondary | ICD-10-CM | POA: Diagnosis not present

## 2018-04-20 DIAGNOSIS — M8580 Other specified disorders of bone density and structure, unspecified site: Secondary | ICD-10-CM | POA: Diagnosis not present

## 2018-04-20 DIAGNOSIS — R3915 Urgency of urination: Secondary | ICD-10-CM | POA: Diagnosis not present

## 2018-04-20 DIAGNOSIS — Z6824 Body mass index (BMI) 24.0-24.9, adult: Secondary | ICD-10-CM | POA: Diagnosis not present

## 2018-04-20 DIAGNOSIS — N8110 Cystocele, unspecified: Secondary | ICD-10-CM | POA: Diagnosis not present

## 2018-07-10 DIAGNOSIS — H1013 Acute atopic conjunctivitis, bilateral: Secondary | ICD-10-CM | POA: Diagnosis not present

## 2018-08-18 DIAGNOSIS — E538 Deficiency of other specified B group vitamins: Secondary | ICD-10-CM | POA: Diagnosis not present

## 2018-08-18 DIAGNOSIS — I10 Essential (primary) hypertension: Secondary | ICD-10-CM | POA: Diagnosis not present

## 2018-08-18 DIAGNOSIS — E7849 Other hyperlipidemia: Secondary | ICD-10-CM | POA: Diagnosis not present

## 2018-08-18 DIAGNOSIS — E7439 Other disorders of intestinal carbohydrate absorption: Secondary | ICD-10-CM | POA: Diagnosis not present

## 2018-08-18 DIAGNOSIS — R82998 Other abnormal findings in urine: Secondary | ICD-10-CM | POA: Diagnosis not present

## 2018-08-18 DIAGNOSIS — E038 Other specified hypothyroidism: Secondary | ICD-10-CM | POA: Diagnosis not present

## 2018-08-25 DIAGNOSIS — M542 Cervicalgia: Secondary | ICD-10-CM | POA: Diagnosis not present

## 2018-08-25 DIAGNOSIS — R82998 Other abnormal findings in urine: Secondary | ICD-10-CM | POA: Diagnosis not present

## 2018-08-25 DIAGNOSIS — G3184 Mild cognitive impairment, so stated: Secondary | ICD-10-CM | POA: Diagnosis not present

## 2018-08-25 DIAGNOSIS — E7849 Other hyperlipidemia: Secondary | ICD-10-CM | POA: Diagnosis not present

## 2018-08-25 DIAGNOSIS — Z0000 Encounter for general adult medical examination without abnormal findings: Secondary | ICD-10-CM | POA: Diagnosis not present

## 2018-08-25 DIAGNOSIS — E7439 Other disorders of intestinal carbohydrate absorption: Secondary | ICD-10-CM | POA: Diagnosis not present

## 2018-08-25 DIAGNOSIS — N183 Chronic kidney disease, stage 3 (moderate): Secondary | ICD-10-CM | POA: Diagnosis not present

## 2018-08-25 DIAGNOSIS — Z1331 Encounter for screening for depression: Secondary | ICD-10-CM | POA: Diagnosis not present

## 2018-08-25 DIAGNOSIS — I129 Hypertensive chronic kidney disease with stage 1 through stage 4 chronic kidney disease, or unspecified chronic kidney disease: Secondary | ICD-10-CM | POA: Diagnosis not present

## 2018-08-25 DIAGNOSIS — Z1212 Encounter for screening for malignant neoplasm of rectum: Secondary | ICD-10-CM | POA: Diagnosis not present

## 2018-08-25 DIAGNOSIS — F1320 Sedative, hypnotic or anxiolytic dependence, uncomplicated: Secondary | ICD-10-CM | POA: Diagnosis not present

## 2018-08-25 DIAGNOSIS — N812 Incomplete uterovaginal prolapse: Secondary | ICD-10-CM | POA: Diagnosis not present

## 2018-08-25 DIAGNOSIS — F319 Bipolar disorder, unspecified: Secondary | ICD-10-CM | POA: Diagnosis not present

## 2018-08-25 DIAGNOSIS — E663 Overweight: Secondary | ICD-10-CM | POA: Diagnosis not present

## 2018-09-16 ENCOUNTER — Encounter: Payer: Self-pay | Admitting: *Deleted

## 2018-10-13 ENCOUNTER — Other Ambulatory Visit: Payer: Self-pay | Admitting: *Deleted

## 2018-10-13 ENCOUNTER — Encounter: Payer: Self-pay | Admitting: *Deleted

## 2018-10-13 ENCOUNTER — Encounter: Payer: Self-pay | Admitting: Neurology

## 2018-10-13 ENCOUNTER — Telehealth (INDEPENDENT_AMBULATORY_CARE_PROVIDER_SITE_OTHER): Payer: PPO | Admitting: Neurology

## 2018-10-13 ENCOUNTER — Other Ambulatory Visit: Payer: Self-pay

## 2018-10-13 VITALS — Ht 65.0 in | Wt 148.0 lb

## 2018-10-13 DIAGNOSIS — G629 Polyneuropathy, unspecified: Secondary | ICD-10-CM | POA: Diagnosis not present

## 2018-10-13 MED ORDER — GABAPENTIN 600 MG PO TABS: 600.0000 mg | ORAL_TABLET | Freq: Three times a day (TID) | ORAL | 3 refills | Status: DC

## 2018-10-13 NOTE — Progress Notes (Signed)
   Virtual Visit via Telephone Note The purpose of this virtual visit is to provide medical care while limiting exposure to the novel coronavirus.    Consent was obtained for telephone visit:  Yes.   Answered questions that patient had about telehealth interaction:  Yes.   I discussed the limitations, risks, security and privacy concerns of performing an evaluation and management service by telemedicine. I also discussed with the patient that there may be a patient responsible charge related to this service. The patient expressed understanding and agreed to proceed.  Pt location: Home Physician Location: office Name of referring provider:  Shon Baton, MD I connected with Theresa Underwood at patients initiation/request on 10/13/2018 at  1:00 PM EDT by video enabled telemedicine application and verified that I am speaking with the correct person using two identifiers. Pt MRN:  [id number] Pt DOB:  05/12/1941 Video Participants:  Theresa Underwood   History of Present Illness: This is a 78 y.o. female with history of hyperlipidemia, hypertension, hypothyroidism, bipolar affective disorder s/p ECT, borderline diabetes type 2, lumbar spinal stenosis for follow-up of hereditary neuropathy.   She has noticed worsening neuropathy in the feet, sometimes coming above the ankles.  Pain is not bothersome enough to increase medication.  She currently takes gabapentin 600mg  TID.  She has not had any falls and endorses being off balance occasionally.    Assessment and Plan:  Hereditary peripheral neuropathy, progressing due to natural course of disease.  - Continue gabapentin 600mg  TID - refills provided for 1 year  - Discussed checking genetic panel for hereditary neuropathy through [facility] when it is safe to bring her back to the office for testing   Follow Up Instructions:   I discussed the assessment and treatment plan with the patient. The patient was provided an opportunity to ask questions and all  were answered. The patient agreed with the plan and demonstrated an understanding of the instructions.   The patient was advised to call back or seek an in-person evaluation if the symptoms worsen or if the condition fails to improve as anticipated.  Follow-up in 6 months   Total time spent:  25 min  Alda Berthold, DO

## 2018-10-16 ENCOUNTER — Other Ambulatory Visit: Payer: PPO | Admitting: *Deleted

## 2018-11-06 ENCOUNTER — Ambulatory Visit: Payer: Medicare Other | Admitting: Neurology

## 2018-11-06 ENCOUNTER — Other Ambulatory Visit: Payer: Self-pay | Admitting: *Deleted

## 2018-11-06 NOTE — Patient Outreach (Signed)
[facility]) Care Management  11/06/2018  BRESHAY ILG 1941/04/08 [id number]  Follow up from HRA screening phone callattempts placed to patient on 09/11/2018 and 09/16/2018- both calls were unsuccessful and patientwas mailed unsuccessful letter on 09/16/2018 without patient call-back to date.  Patientto be followed in [facility] engaged program.   Plan:  Will re-attempt call to patient within 2 months of initial unsuccessful attempts previously made  Oneta Rack, RN, BSN, [place] Care Management  [phone]

## 2018-11-12 ENCOUNTER — Other Ambulatory Visit: Payer: Self-pay | Admitting: *Deleted

## 2018-11-12 NOTE — Patient Outreach (Signed)
[facility]) Care Management [place]- follow up from initial unsuccessful attempt 11/12/2018  Theresa Underwood 28-Aug-1940 [id number]  Follow up from previous [facility] HRA screening phone callattempts placed to patient on3/27/2020 and4/06/2018- both calls were unsuccessful and patientwas mailed unsuccessful letter on4/01/2020without patient call-back to date.  Re-attempted call to patient and screening call completed; HIPAA/ identity verified.  Patient denies ongoing unmanageable chronic conditions and reports that she is independent in care needs for ADL and IADL; denies issues/ concerns with medications.  Reports still drives and works.  Plan:  Patient to be followed in HRA engaged program; will mail patient welcome letter.  Theresa Rack, RN, BSN, [employer] [facility] Care Management  [phone]

## 2018-12-01 ENCOUNTER — Other Ambulatory Visit: Payer: Self-pay | Admitting: *Deleted

## 2018-12-01 NOTE — Patient Outreach (Signed)
[facility]) Care Management [facility] CM Case Closure note Patient active with external program 12/01/2018  [name] 1940-12-17 [id number]  [facility] Care Management Case Closure note from previously placed [facility] HRA screening phone call  Received confirmation from [place] leadership team that patient is now active with an external program  Plan:  Will close patient case and make patient inactive with [facility] CM  Theresa Rack, RN, BSN, [place] Coordinator [facility] Care Management  [phone]

## 2018-12-23 ENCOUNTER — Other Ambulatory Visit: Payer: Self-pay | Admitting: Internal Medicine

## 2018-12-23 DIAGNOSIS — N8111 Cystocele, midline: Secondary | ICD-10-CM | POA: Diagnosis not present

## 2018-12-23 DIAGNOSIS — N952 Postmenopausal atrophic vaginitis: Secondary | ICD-10-CM | POA: Diagnosis not present

## 2018-12-23 DIAGNOSIS — N390 Urinary tract infection, site not specified: Secondary | ICD-10-CM | POA: Diagnosis not present

## 2018-12-23 DIAGNOSIS — Z1231 Encounter for screening mammogram for malignant neoplasm of breast: Secondary | ICD-10-CM

## 2018-12-30 DIAGNOSIS — L814 Other melanin hyperpigmentation: Secondary | ICD-10-CM | POA: Diagnosis not present

## 2018-12-30 DIAGNOSIS — L821 Other seborrheic keratosis: Secondary | ICD-10-CM | POA: Diagnosis not present

## 2018-12-30 DIAGNOSIS — L853 Xerosis cutis: Secondary | ICD-10-CM | POA: Diagnosis not present

## 2018-12-30 DIAGNOSIS — I788 Other diseases of capillaries: Secondary | ICD-10-CM | POA: Diagnosis not present

## 2019-01-12 DIAGNOSIS — F3132 Bipolar disorder, current episode depressed, moderate: Secondary | ICD-10-CM | POA: Diagnosis not present

## 2019-01-18 DIAGNOSIS — R350 Frequency of micturition: Secondary | ICD-10-CM | POA: Diagnosis not present

## 2019-01-18 DIAGNOSIS — N390 Urinary tract infection, site not specified: Secondary | ICD-10-CM | POA: Diagnosis not present

## 2019-01-18 DIAGNOSIS — R319 Hematuria, unspecified: Secondary | ICD-10-CM | POA: Diagnosis not present

## 2019-01-19 DIAGNOSIS — H90A21 Sensorineural hearing loss, unilateral, right ear, with restricted hearing on the contralateral side: Secondary | ICD-10-CM | POA: Diagnosis not present

## 2019-01-19 DIAGNOSIS — H90A32 Mixed conductive and sensorineural hearing loss, unilateral, left ear with restricted hearing on the contralateral side: Secondary | ICD-10-CM | POA: Diagnosis not present

## 2019-01-21 ENCOUNTER — Other Ambulatory Visit: Payer: Self-pay

## 2019-01-21 DIAGNOSIS — Z20822 Contact with and (suspected) exposure to covid-19: Secondary | ICD-10-CM

## 2019-01-22 LAB — NOVEL CORONAVIRUS, NAA: SARS-CoV-2, NAA: NOT DETECTED

## 2019-02-02 ENCOUNTER — Other Ambulatory Visit: Payer: Self-pay

## 2019-02-02 ENCOUNTER — Ambulatory Visit
Admission: RE | Admit: 2019-02-02 | Discharge: 2019-02-02 | Disposition: A | Discharge status: Home / Self Care | Payer: PPO | Source: Ambulatory Visit | Attending: Internal Medicine | Admitting: Internal Medicine

## 2019-02-02 DIAGNOSIS — Z1231 Encounter for screening mammogram for malignant neoplasm of breast: Secondary | ICD-10-CM | POA: Diagnosis not present

## 2019-03-02 DIAGNOSIS — I129 Hypertensive chronic kidney disease with stage 1 through stage 4 chronic kidney disease, or unspecified chronic kidney disease: Secondary | ICD-10-CM | POA: Diagnosis not present

## 2019-03-02 DIAGNOSIS — G629 Polyneuropathy, unspecified: Secondary | ICD-10-CM | POA: Diagnosis not present

## 2019-03-02 DIAGNOSIS — F1320 Sedative, hypnotic or anxiolytic dependence, uncomplicated: Secondary | ICD-10-CM | POA: Diagnosis not present

## 2019-03-02 DIAGNOSIS — F319 Bipolar disorder, unspecified: Secondary | ICD-10-CM | POA: Diagnosis not present

## 2019-03-02 DIAGNOSIS — E039 Hypothyroidism, unspecified: Secondary | ICD-10-CM | POA: Diagnosis not present

## 2019-03-02 DIAGNOSIS — E663 Overweight: Secondary | ICD-10-CM | POA: Diagnosis not present

## 2019-03-02 DIAGNOSIS — N812 Incomplete uterovaginal prolapse: Secondary | ICD-10-CM | POA: Diagnosis not present

## 2019-03-02 DIAGNOSIS — N183 Chronic kidney disease, stage 3 (moderate): Secondary | ICD-10-CM | POA: Diagnosis not present

## 2019-03-02 DIAGNOSIS — E538 Deficiency of other specified B group vitamins: Secondary | ICD-10-CM | POA: Diagnosis not present

## 2019-03-02 DIAGNOSIS — G3184 Mild cognitive impairment, so stated: Secondary | ICD-10-CM | POA: Diagnosis not present

## 2019-03-23 DIAGNOSIS — D1722 Benign lipomatous neoplasm of skin and subcutaneous tissue of left arm: Secondary | ICD-10-CM | POA: Diagnosis not present

## 2019-03-23 DIAGNOSIS — B351 Tinea unguium: Secondary | ICD-10-CM | POA: Diagnosis not present

## 2019-04-16 ENCOUNTER — Ambulatory Visit (INDEPENDENT_AMBULATORY_CARE_PROVIDER_SITE_OTHER): Payer: PPO | Admitting: Neurology

## 2019-04-16 ENCOUNTER — Encounter: Payer: Self-pay | Admitting: Neurology

## 2019-04-16 ENCOUNTER — Other Ambulatory Visit: Payer: Self-pay

## 2019-04-16 VITALS — BP 150/78 | HR 78 | Ht 65.0 in | Wt 148.0 lb

## 2019-04-16 DIAGNOSIS — G629 Polyneuropathy, unspecified: Secondary | ICD-10-CM | POA: Diagnosis not present

## 2019-04-16 NOTE — Progress Notes (Signed)
Follow-up Visit   Date: 04/16/19    Theresa Underwood MRN: [id number] DOB: 1940/08/18   Interim History: Theresa Underwood is a 78 y.o. right-handed Caucasian female with history of hyperlipidemia, hypertension, hypothyroidism, bipolar affective disorder s/p ECT, borderline diabetes type 2, lumbar spinal stenosis, here for follow-up of hereditary peripheral neuropathy.  The patient was accompanied to the clinic by self.  History of present illness: In the early 1990s, she developed gradual onset of numbness involving the tips of her toes, described as a tight sensation over the feet. Over the years, her symptoms have progressed and now she gets numbness and prickly sensation over her lower legs (below the knee) and into her feet. Symptoms are worse when she rests and alleviated with neurontin. Of late, she has developed cold sensation of her feet and often puts them in a warm bath which helps. She takes neurontin 600mg  TID (8am, 1pm, 12am) and and lamictal 150mg  (for depression). She denies any fall and is ambulating independently. She has intermittent pain and tinging of the hands, but denies any weakness. Previously tried medications include metanx, Cymbalta, and Lyrica. She also has hammertoes and history of similar symptoms involving her mother, sister, father, and maternal grandfather. None of her family members were wheelchair-bound. Her mother has numbness/tingling of the feet and ambulated independently until late 80s then transitioned to a cane/walker due to spinal stenosis.   She was initially under the care of Dr. Parks Neptune at [facility] who diagnosed her with hereditary peripheral neuropathy in 1992 and was until his care until 1995 at which time he left the practice and transitioned care to Dr. Erling Cruz and since his retirement was last seen by Dr. Krista Blue in May 2014. Her last clinic note dated 11/05/2012 was reviewed and summarized as follows:  Initial EMG in July 1996 and January 1998 showed sensory  and motor polyneuropathy with predominantly demyelinating features. Subsequent electrodiagnostic studies in August 2001 and December 2007 was more consistent with "an axonal 'small fiber' peripheral neuropathy". There was no evidence of a lumbosacral radiculopathy.   EMG of the arms shows cervical radiculopathy affecting right C5, left C7, and bilateral T1 nerve roots.  There is no evidence of neuropathy or CTS.  Her neuropathic pain is well-controlled neurontin 600mg  TID.    In 2016, she started having memory issues, especially with short-term recall, such as trying to remember a telephone number.  She is very active and does her own IADLs. No wording finding problems, but has been told that she repeats herself.  She has a history of bipolar for which she takes lamictal.  Denies any depressive symptoms.   UPDATE 04/16/2019:  She is here for follow-up visit.  There has been no marked progression of neuropathy in the feet and takes gabapentin 600mg  three times daily which helps.  She suffered one mechanical fall and walks unassisted.  Sometimes her legs feel very cold and she soaks them in warm water to get relief.   She is is anxious about her husband's recent diagnosis of macular degeneration and pulmonary fibrosis. They were planning on moving into [place] when construction has finished, but may explore other options now.   Medications:  Current Outpatient Medications on File Prior to Visit  Medication Sig Dispense Refill  . acetaminophen (TYLENOL) 500 MG tablet Take 500 mg by mouth as needed.    . Albuterol Sulfate (PROAIR RESPICLICK) [id number] (90 Base) MCG/ACT AEPB ProAir RespiClick 90 mcg/actuation breath activated  inhale 2 puffs every  4 to 6 hours for cough    . clobetasol cream (TEMOVATE) 0.05 % clobetasol 0.05 % topical cream    . clonazePAM (KLONOPIN) 0.5 MG tablet Take 0.5 mg by mouth at bedtime.    . gabapentin (NEURONTIN) 600 MG tablet Take 1 tablet (600 mg total) by mouth 3 (three)  times daily. 270 tablet 3  . hyoscyamine (LEVSIN/SL) 0.125 MG SL tablet Place 1 tablet (0.125 mg total) under the tongue every 6 (six) hours as needed. 30 tablet 2  . lamoTRIgine (LAMICTAL) 200 MG tablet Take 200 mg by mouth daily.    Marland Kitchen levothyroxine (SYNTHROID, LEVOTHROID) 112 MCG tablet Take 112 mcg by mouth at bedtime. Brand name only    . lisinopril (PRINIVIL,ZESTRIL) 10 MG tablet Take 10 mg by mouth 3 (three) times a week. Take on Monday, Wednesday, Friday    . Multiple Vitamin (MULTI-VITAMINS) TABS Take by mouth.    . mupirocin ointment (BACTROBAN) 2 % apply to affected area three times a day for 10 days WHEN HAS ACTIVE INFECTIONS  0  . docusate sodium (COLACE) 100 MG capsule Take 1 capsule (100 mg total) by mouth 2 (two) times daily.    Marland Kitchen HYDROcodone-acetaminophen (NORCO) 5-325 MG tablet Take 1-2 tablets by mouth every 6 (six) hours as needed for moderate pain. (Patient not taking: Reported on 04/16/2019) 30 tablet 0  . Multiple Vitamin (MULTI-VITAMIN) tablet Take by mouth.    . nitrofurantoin (MACRODANTIN) 100 MG capsule TK ONE C PO QD     No current facility-administered medications on file prior to visit.     Allergies:  Allergies  Allergen Reactions  . Sulfa Antibiotics Hives  . Carbamazepine     REACTION: hives with tegretol  . Sulfonamide Derivatives     REACTION: hives     Review of Systems:  CONSTITUTIONAL: No fevers, chills, night sweats, or weight loss.   EYES: No visual changes or eye pain ENT: No hearing changes.  No history of nose bleeds.   RESPIRATORY: No cough, wheezing and shortness of breath.   CARDIOVASCULAR: Negative for chest pain, and palpitations.   GI: Negative for abdominal discomfort, blood in stools or black stools.  No recent change in bowel habits.   GU:  No history of incontinence.   MUSCLOSKELETAL: +history of joint pain or swelling.  No myalgias.   SKIN: Negative for lesions, rash, and itching.   ENDOCRINE: Negative for cold or heat  intolerance, polydipsia or goiter.   PSYCH:  No depression +anxiety symptoms.   NEURO: As Above.   Vital Signs:  BP (!) 150/78   Pulse 78   Ht 5\' 5"  (1.651 m)   Wt 148 lb (67.1 kg)   SpO2 98%   BMI 24.63 kg/m  Neurological Exam:    MENTAL STATUS including orientation to time, place, person, recent and remote memory, attention span and concentration, language, and fund of knowledge is normal.  Speech is not dysarthric.  Montreal Cognitive Assessment  11/07/2017 11/10/2014  Visuospatial/ Executive (0/5) 5 5  Naming (0/3) 2 2  Attention: Read list of digits (0/2) 2 2  Attention: Read list of letters (0/1) 1 1  Attention: Serial 7 subtraction starting at 100 (0/3) 3 3  Language: Repeat phrase (0/2) 2 2  Language : Fluency (0/1) 1 0  Abstraction (0/2) 2 2  Delayed Recall (0/5) 3 4  Orientation (0/6) 6 6  Total 27 27  Adjusted Score (based on education) 27 -    CRANIAL NERVES:  Pupils  equal round and reactive to light.  Normal conjugate, extra-ocular eye movements in all directions of gaze.  Subtle right ptosis.   MOTOR:  Motor strength is 5/5 in all extremities, except interosseus muscles 5-/5, ABP 4+/5, and toe extensors 5-/5 bilaterally.  Tone is normal.   Dorslis pedis pulses bilaterally is easily palpable.   REFLEXES:  Reflexes are 3+/4 throughout, except absent Achilles bilaterally.  SENSORY:  Vibration absent at great toe bilaterally bilaterally.  COORDINATION/GAIT:  Gait narrow based and stable.  Data: EMG 10/25/2013:  There is electrophysiological evidence of a multilevel cervical radiculopathy affecting the right C5, left C7, and bilateral T1 nerve roots/segment, overall mild-to-moderate degree electrically. There is no evidence generalized sensorimotor polyneuropathy or carpal tunnel syndrome affecting the upper extremities.  Labs 10/05/2013:  Vitamin B12 291*, SPEP/UPEP with IFE - no M protein, copper 139  MRI of the lumbar spine without contrast 12/07/2004: Multilevel  spondylosis, advanced at L3-4 L5-S1, and moderate to mild spondylosis L3-4 and mild spinal stenosis at L4-5. An osteophyte at L5-S1 might encroach the extraforaminal portion of the left L5 nerve root. There is no disc herniation.      IMPRESSION/PLAN: 1.  Peripheral neuropathy affecting bilateral lower extremities, mild and stable.  Strong family history of neuropathy, likely hereditary (CMT1A?)  - Continue neurontin 600mg  TID which adequately controls symptoms   - Genetic testing discussed, will hold off for now  - Patient educated on daily foot inspection, fall prevention, and safety precautions around the home.  2.  Mild cognitive impairment, stable and most likely contributed by anxiety.  Her MOCA exam has been stable for 2+ years. Reassured patient that there is no signs of dementia.   3.  Social support provided as she became tearful talking about her husband medical comorbidities    Return to clinic in 1 year  Greater than 50% of this 20 minute visit was spent in counseling, explanation of diagnosis, planning of further management, and coordination of care.   Thank you for allowing me to participate in patient's care.  If I can answer any additional questions, I would be pleased to do so.    Sincerely,    Aodhan Scheidt K. Posey Pronto, DO

## 2019-04-16 NOTE — Patient Instructions (Signed)
Check feet daily, take caution when walking on uneven ground  Return to clinic in 1 year

## 2019-05-10 ENCOUNTER — Ambulatory Visit: Payer: PPO | Admitting: *Deleted

## 2019-05-11 DIAGNOSIS — R399 Unspecified symptoms and signs involving the genitourinary system: Secondary | ICD-10-CM | POA: Diagnosis not present

## 2019-05-12 DIAGNOSIS — N398 Other specified disorders of urinary system: Secondary | ICD-10-CM | POA: Diagnosis not present

## 2019-05-12 DIAGNOSIS — N812 Incomplete uterovaginal prolapse: Secondary | ICD-10-CM | POA: Diagnosis not present

## 2019-05-12 DIAGNOSIS — N8111 Cystocele, midline: Secondary | ICD-10-CM | POA: Diagnosis not present

## 2019-05-12 DIAGNOSIS — N390 Urinary tract infection, site not specified: Secondary | ICD-10-CM | POA: Diagnosis not present

## 2019-05-12 DIAGNOSIS — N952 Postmenopausal atrophic vaginitis: Secondary | ICD-10-CM | POA: Diagnosis not present

## 2019-05-19 DIAGNOSIS — H02413 Mechanical ptosis of bilateral eyelids: Secondary | ICD-10-CM | POA: Diagnosis not present

## 2019-05-19 DIAGNOSIS — H53483 Generalized contraction of visual field, bilateral: Secondary | ICD-10-CM | POA: Diagnosis not present

## 2019-05-19 DIAGNOSIS — H0279 Other degenerative disorders of eyelid and periocular area: Secondary | ICD-10-CM | POA: Diagnosis not present

## 2019-05-19 DIAGNOSIS — H02423 Myogenic ptosis of bilateral eyelids: Secondary | ICD-10-CM | POA: Diagnosis not present

## 2019-05-19 DIAGNOSIS — H02834 Dermatochalasis of left upper eyelid: Secondary | ICD-10-CM | POA: Diagnosis not present

## 2019-05-19 DIAGNOSIS — H02835 Dermatochalasis of left lower eyelid: Secondary | ICD-10-CM | POA: Diagnosis not present

## 2019-05-19 DIAGNOSIS — H02832 Dermatochalasis of right lower eyelid: Secondary | ICD-10-CM | POA: Diagnosis not present

## 2019-05-19 DIAGNOSIS — H02831 Dermatochalasis of right upper eyelid: Secondary | ICD-10-CM | POA: Diagnosis not present

## 2019-05-25 DIAGNOSIS — N390 Urinary tract infection, site not specified: Secondary | ICD-10-CM | POA: Diagnosis not present

## 2019-06-14 DIAGNOSIS — R399 Unspecified symptoms and signs involving the genitourinary system: Secondary | ICD-10-CM | POA: Diagnosis not present

## 2019-06-15 DIAGNOSIS — H90A32 Mixed conductive and sensorineural hearing loss, unilateral, left ear with restricted hearing on the contralateral side: Secondary | ICD-10-CM | POA: Diagnosis not present

## 2019-06-22 DIAGNOSIS — Z6825 Body mass index (BMI) 25.0-25.9, adult: Secondary | ICD-10-CM | POA: Diagnosis not present

## 2019-06-22 DIAGNOSIS — Z01419 Encounter for gynecological examination (general) (routine) without abnormal findings: Secondary | ICD-10-CM | POA: Diagnosis not present

## 2019-07-13 DIAGNOSIS — F3132 Bipolar disorder, current episode depressed, moderate: Secondary | ICD-10-CM | POA: Diagnosis not present

## 2019-07-14 DIAGNOSIS — N390 Urinary tract infection, site not specified: Secondary | ICD-10-CM | POA: Diagnosis not present

## 2019-07-20 DIAGNOSIS — N393 Stress incontinence (female) (male): Secondary | ICD-10-CM | POA: Diagnosis not present

## 2019-07-20 DIAGNOSIS — N814 Uterovaginal prolapse, unspecified: Secondary | ICD-10-CM | POA: Diagnosis not present

## 2019-07-21 DIAGNOSIS — N819 Female genital prolapse, unspecified: Secondary | ICD-10-CM | POA: Diagnosis not present

## 2019-07-21 DIAGNOSIS — K582 Mixed irritable bowel syndrome: Secondary | ICD-10-CM | POA: Diagnosis not present

## 2019-07-21 DIAGNOSIS — N8111 Cystocele, midline: Secondary | ICD-10-CM | POA: Diagnosis not present

## 2019-07-21 DIAGNOSIS — N390 Urinary tract infection, site not specified: Secondary | ICD-10-CM | POA: Diagnosis not present

## 2019-07-27 DIAGNOSIS — M8580 Other specified disorders of bone density and structure, unspecified site: Secondary | ICD-10-CM | POA: Diagnosis not present

## 2019-08-31 DIAGNOSIS — E538 Deficiency of other specified B group vitamins: Secondary | ICD-10-CM | POA: Diagnosis not present

## 2019-08-31 DIAGNOSIS — E038 Other specified hypothyroidism: Secondary | ICD-10-CM | POA: Diagnosis not present

## 2019-08-31 DIAGNOSIS — E7849 Other hyperlipidemia: Secondary | ICD-10-CM | POA: Diagnosis not present

## 2019-09-07 DIAGNOSIS — F319 Bipolar disorder, unspecified: Secondary | ICD-10-CM | POA: Diagnosis not present

## 2019-09-07 DIAGNOSIS — I129 Hypertensive chronic kidney disease with stage 1 through stage 4 chronic kidney disease, or unspecified chronic kidney disease: Secondary | ICD-10-CM | POA: Diagnosis not present

## 2019-09-07 DIAGNOSIS — F1320 Sedative, hypnotic or anxiolytic dependence, uncomplicated: Secondary | ICD-10-CM | POA: Diagnosis not present

## 2019-09-07 DIAGNOSIS — E663 Overweight: Secondary | ICD-10-CM | POA: Diagnosis not present

## 2019-09-07 DIAGNOSIS — E7439 Other disorders of intestinal carbohydrate absorption: Secondary | ICD-10-CM | POA: Diagnosis not present

## 2019-09-07 DIAGNOSIS — N812 Incomplete uterovaginal prolapse: Secondary | ICD-10-CM | POA: Diagnosis not present

## 2019-09-07 DIAGNOSIS — R82998 Other abnormal findings in urine: Secondary | ICD-10-CM | POA: Diagnosis not present

## 2019-09-07 DIAGNOSIS — G3184 Mild cognitive impairment, so stated: Secondary | ICD-10-CM | POA: Diagnosis not present

## 2019-09-07 DIAGNOSIS — Z0000 Encounter for general adult medical examination without abnormal findings: Secondary | ICD-10-CM | POA: Diagnosis not present

## 2019-09-07 DIAGNOSIS — N1831 Chronic kidney disease, stage 3a: Secondary | ICD-10-CM | POA: Diagnosis not present

## 2019-09-07 DIAGNOSIS — M4800 Spinal stenosis, site unspecified: Secondary | ICD-10-CM | POA: Diagnosis not present

## 2019-09-07 DIAGNOSIS — M810 Age-related osteoporosis without current pathological fracture: Secondary | ICD-10-CM | POA: Diagnosis not present

## 2019-09-07 DIAGNOSIS — I10 Essential (primary) hypertension: Secondary | ICD-10-CM | POA: Diagnosis not present

## 2019-09-07 DIAGNOSIS — M1990 Unspecified osteoarthritis, unspecified site: Secondary | ICD-10-CM | POA: Diagnosis not present

## 2019-10-19 DIAGNOSIS — M549 Dorsalgia, unspecified: Secondary | ICD-10-CM | POA: Diagnosis not present

## 2019-10-26 DIAGNOSIS — M549 Dorsalgia, unspecified: Secondary | ICD-10-CM | POA: Diagnosis not present

## 2019-11-02 DIAGNOSIS — M549 Dorsalgia, unspecified: Secondary | ICD-10-CM | POA: Diagnosis not present

## 2019-12-06 ENCOUNTER — Other Ambulatory Visit: Payer: Self-pay | Admitting: Neurology

## 2020-01-03 ENCOUNTER — Other Ambulatory Visit: Payer: Self-pay | Admitting: Internal Medicine

## 2020-01-03 DIAGNOSIS — Z1231 Encounter for screening mammogram for malignant neoplasm of breast: Secondary | ICD-10-CM

## 2020-02-14 DIAGNOSIS — M19072 Primary osteoarthritis, left ankle and foot: Secondary | ICD-10-CM | POA: Diagnosis not present

## 2020-02-14 DIAGNOSIS — M792 Neuralgia and neuritis, unspecified: Secondary | ICD-10-CM | POA: Diagnosis not present

## 2020-02-14 DIAGNOSIS — L603 Nail dystrophy: Secondary | ICD-10-CM | POA: Diagnosis not present

## 2020-02-14 DIAGNOSIS — M19071 Primary osteoarthritis, right ankle and foot: Secondary | ICD-10-CM | POA: Diagnosis not present

## 2020-02-14 DIAGNOSIS — I739 Peripheral vascular disease, unspecified: Secondary | ICD-10-CM | POA: Diagnosis not present

## 2020-02-22 DIAGNOSIS — L603 Nail dystrophy: Secondary | ICD-10-CM | POA: Diagnosis not present

## 2020-02-29 ENCOUNTER — Ambulatory Visit: Payer: PPO

## 2020-03-01 DIAGNOSIS — Z20822 Contact with and (suspected) exposure to covid-19: Secondary | ICD-10-CM | POA: Diagnosis not present

## 2020-03-01 DIAGNOSIS — Z03818 Encounter for observation for suspected exposure to other biological agents ruled out: Secondary | ICD-10-CM | POA: Diagnosis not present

## 2020-03-08 ENCOUNTER — Encounter: Payer: Self-pay | Admitting: Neurology

## 2020-03-14 ENCOUNTER — Ambulatory Visit: Payer: PPO

## 2020-03-17 ENCOUNTER — Other Ambulatory Visit: Payer: Self-pay | Admitting: Internal Medicine

## 2020-03-17 DIAGNOSIS — N644 Mastodynia: Secondary | ICD-10-CM

## 2020-03-21 DIAGNOSIS — E538 Deficiency of other specified B group vitamins: Secondary | ICD-10-CM | POA: Diagnosis not present

## 2020-03-21 DIAGNOSIS — E039 Hypothyroidism, unspecified: Secondary | ICD-10-CM | POA: Diagnosis not present

## 2020-03-21 DIAGNOSIS — F1320 Sedative, hypnotic or anxiolytic dependence, uncomplicated: Secondary | ICD-10-CM | POA: Diagnosis not present

## 2020-03-21 DIAGNOSIS — G629 Polyneuropathy, unspecified: Secondary | ICD-10-CM | POA: Diagnosis not present

## 2020-03-21 DIAGNOSIS — F319 Bipolar disorder, unspecified: Secondary | ICD-10-CM | POA: Diagnosis not present

## 2020-03-21 DIAGNOSIS — M19071 Primary osteoarthritis, right ankle and foot: Secondary | ICD-10-CM | POA: Diagnosis not present

## 2020-03-21 DIAGNOSIS — M810 Age-related osteoporosis without current pathological fracture: Secondary | ICD-10-CM | POA: Diagnosis not present

## 2020-03-21 DIAGNOSIS — G3184 Mild cognitive impairment, so stated: Secondary | ICD-10-CM | POA: Diagnosis not present

## 2020-03-21 DIAGNOSIS — N1831 Chronic kidney disease, stage 3a: Secondary | ICD-10-CM | POA: Diagnosis not present

## 2020-03-21 DIAGNOSIS — E663 Overweight: Secondary | ICD-10-CM | POA: Diagnosis not present

## 2020-03-21 DIAGNOSIS — L603 Nail dystrophy: Secondary | ICD-10-CM | POA: Diagnosis not present

## 2020-03-21 DIAGNOSIS — M21612 Bunion of left foot: Secondary | ICD-10-CM | POA: Diagnosis not present

## 2020-03-21 DIAGNOSIS — Z23 Encounter for immunization: Secondary | ICD-10-CM | POA: Diagnosis not present

## 2020-03-21 DIAGNOSIS — M19072 Primary osteoarthritis, left ankle and foot: Secondary | ICD-10-CM | POA: Diagnosis not present

## 2020-03-21 DIAGNOSIS — E785 Hyperlipidemia, unspecified: Secondary | ICD-10-CM | POA: Diagnosis not present

## 2020-03-21 DIAGNOSIS — I129 Hypertensive chronic kidney disease with stage 1 through stage 4 chronic kidney disease, or unspecified chronic kidney disease: Secondary | ICD-10-CM | POA: Diagnosis not present

## 2020-04-17 DIAGNOSIS — R269 Unspecified abnormalities of gait and mobility: Secondary | ICD-10-CM | POA: Diagnosis not present

## 2020-04-17 DIAGNOSIS — M545 Low back pain, unspecified: Secondary | ICD-10-CM | POA: Diagnosis not present

## 2020-04-17 DIAGNOSIS — M546 Pain in thoracic spine: Secondary | ICD-10-CM | POA: Diagnosis not present

## 2020-04-18 DIAGNOSIS — H02831 Dermatochalasis of right upper eyelid: Secondary | ICD-10-CM | POA: Diagnosis not present

## 2020-04-18 DIAGNOSIS — H5203 Hypermetropia, bilateral: Secondary | ICD-10-CM | POA: Diagnosis not present

## 2020-04-18 DIAGNOSIS — H2513 Age-related nuclear cataract, bilateral: Secondary | ICD-10-CM | POA: Diagnosis not present

## 2020-04-18 DIAGNOSIS — H02834 Dermatochalasis of left upper eyelid: Secondary | ICD-10-CM | POA: Diagnosis not present

## 2020-04-21 ENCOUNTER — Ambulatory Visit: Payer: PPO | Admitting: Neurology

## 2020-04-24 DIAGNOSIS — M545 Low back pain, unspecified: Secondary | ICD-10-CM | POA: Diagnosis not present

## 2020-04-24 DIAGNOSIS — M546 Pain in thoracic spine: Secondary | ICD-10-CM | POA: Diagnosis not present

## 2020-04-24 DIAGNOSIS — R269 Unspecified abnormalities of gait and mobility: Secondary | ICD-10-CM | POA: Diagnosis not present

## 2020-04-25 ENCOUNTER — Other Ambulatory Visit: Payer: Self-pay

## 2020-04-25 ENCOUNTER — Ambulatory Visit: Payer: PPO

## 2020-04-25 ENCOUNTER — Ambulatory Visit
Admission: RE | Admit: 2020-04-25 | Discharge: 2020-04-25 | Disposition: A | Discharge status: Home / Self Care | Payer: PPO | Source: Ambulatory Visit | Attending: Internal Medicine | Admitting: Internal Medicine

## 2020-04-25 DIAGNOSIS — R922 Inconclusive mammogram: Secondary | ICD-10-CM | POA: Diagnosis not present

## 2020-04-25 DIAGNOSIS — N644 Mastodynia: Secondary | ICD-10-CM

## 2020-04-27 ENCOUNTER — Ambulatory Visit (INDEPENDENT_AMBULATORY_CARE_PROVIDER_SITE_OTHER): Payer: PPO | Admitting: Neurology

## 2020-04-27 ENCOUNTER — Other Ambulatory Visit: Payer: Self-pay

## 2020-04-27 ENCOUNTER — Encounter: Payer: Self-pay | Admitting: Neurology

## 2020-04-27 VITALS — BP 134/72 | HR 71 | Ht 65.0 in | Wt 150.0 lb

## 2020-04-27 DIAGNOSIS — G629 Polyneuropathy, unspecified: Secondary | ICD-10-CM | POA: Diagnosis not present

## 2020-04-27 MED ORDER — GABAPENTIN 600 MG PO TABS
ORAL_TABLET | ORAL | 3 refills | Status: DC
Start: 2020-04-27 — End: 2021-04-30

## 2020-04-27 NOTE — Progress Notes (Signed)
Follow-up Visit   Date: 04/27/20    Theresa Underwood MRN: [id number] DOB: 11/17/1940   Interim History: Theresa Underwood is a 79 y.o. right-handed Caucasian female with history of hyperlipidemia, hypertension, hypothyroidism, bipolar affective disorder s/p ECT, borderline diabetes type 2, lumbar spinal stenosis, here for follow-up of hereditary peripheral neuropathy.  The patient was accompanied to the clinic by self.  History of present illness: In the early 1990s, she developed gradual onset of numbness involving the tips of her toes, described as a tight sensation over the feet. Over the years, her symptoms have progressed and now she gets numbness and prickly sensation over her lower legs (below the knee) and into her feet. Symptoms are worse when she rests and alleviated with neurontin. Of late, she has developed cold sensation of her feet and often puts them in a warm bath which helps. She takes neurontin 600mg  TID (8am, 1pm, 12am) and and lamictal 150mg  (for depression). She denies any fall and is ambulating independently. She has intermittent pain and tinging of the hands, but denies any weakness. Previously tried medications include metanx, Cymbalta, and Lyrica. She also has hammertoes and history of similar symptoms involving her mother, sister, father, and maternal grandfather. None of her family members were wheelchair-bound. Her mother has numbness/tingling of the feet and ambulated independently until late 80s then transitioned to a cane/walker due to spinal stenosis.   She was initially under the care of Dr. Parks Neptune at [facility] who diagnosed her with hereditary peripheral neuropathy in 1992 and was until his care until 1995 at which time he left the practice and transitioned care to Dr. Erling Cruz and since his retirement was last seen by Dr. Krista Blue in May 2014. Her last clinic note dated 11/05/2012 was reviewed and summarized as follows:  Initial EMG in July 1996 and January 1998 showed sensory  and motor polyneuropathy with predominantly demyelinating features. Subsequent electrodiagnostic studies in August 2001 and December 2007 was more consistent with "an axonal 'small fiber' peripheral neuropathy". There was no evidence of a lumbosacral radiculopathy.   EMG of the arms shows cervical radiculopathy affecting right C5, left C7, and bilateral T1 nerve roots.  There is no evidence of neuropathy or CTS.  Her neuropathic pain is well-controlled neurontin 600mg  TID.    In 2016, she started having memory issues, especially with short-term recall, such as trying to remember a telephone number.  She is very active and does her own IADLs. No wording finding problems, but has been told that she repeats herself.  She has a history of bipolar for which she takes lamictal.  Denies any depressive symptoms.   UPDATE 04/27/2020:  She is here for follow-up visit.  She has some progression of neuropathy in the lower legs, but pain remains controlled on gabapentin 600mg  three times daily. She has not had any falls and continues to walk unassisted.  She has many questions about prognosis of neuropathy.  She is very worried about her husband's medical issues.  She continues to have intermittent word-finding difficulty and mild memory changes.  She remains highly independent and manages all ADLs and IADLs.  She continues to work one day a week.   Medications:  Current Outpatient Medications on File Prior to Visit  Medication Sig Dispense Refill  . acetaminophen (TYLENOL) 500 MG tablet Take 500 mg by mouth as needed.    . Albuterol Sulfate (PROAIR RESPICLICK) 466 (90 Base) MCG/ACT AEPB ProAir RespiClick 90 mcg/actuation breath activated  inhale 2 puffs  every 4 to 6 hours for cough    . cephALEXin (KEFLEX) 500 MG capsule Take 1 tablet twice daily x 7 days    . clobetasol cream (TEMOVATE) 0.05 % clobetasol 0.05 % topical cream    . clonazePAM (KLONOPIN) 0.5 MG tablet Take 0.5 mg by mouth at bedtime.    .  docusate sodium (COLACE) 100 MG capsule Take 1 capsule (100 mg total) by mouth 2 (two) times daily.    Marland Kitchen gabapentin (NEURONTIN) 600 MG tablet TAKE 1 TABLET(600 MG) BY MOUTH THREE TIMES DAILY 270 tablet 1  . HYDROcodone-acetaminophen (NORCO) 5-325 MG tablet Take 1-2 tablets by mouth every 6 (six) hours as needed for moderate pain. 30 tablet 0  . hyoscyamine (LEVSIN/SL) 0.125 MG SL tablet Place 1 tablet (0.125 mg total) under the tongue every 6 (six) hours as needed. 30 tablet 2  . lamoTRIgine (LAMICTAL) 200 MG tablet Take 200 mg by mouth daily.    Marland Kitchen levothyroxine (SYNTHROID, LEVOTHROID) 112 MCG tablet Take 112 mcg by mouth at bedtime. Brand name only    . lisinopril (PRINIVIL,ZESTRIL) 10 MG tablet Take 10 mg by mouth 3 (three) times a week. Take on Monday, Wednesday, Friday    . Multiple Vitamin (MULTI-VITAMIN) tablet Take by mouth.    . Multiple Vitamin (MULTI-VITAMINS) TABS Take by mouth.    . mupirocin ointment (BACTROBAN) 2 % apply to affected area three times a day for 10 days WHEN HAS ACTIVE INFECTIONS  0  . nitrofurantoin (MACRODANTIN) 100 MG capsule TK ONE C PO QD     No current facility-administered medications on file prior to visit.    Allergies:  Allergies  Allergen Reactions  . Sulfa Antibiotics Hives  . Carbamazepine     REACTION: hives with tegretol  . Sulfonamide Derivatives     REACTION: hives     Vital Signs:  BP 134/72   Pulse 71   Ht 5\' 5"  (1.651 m)   Wt 150 lb (68 kg)   SpO2 97%   BMI 24.96 kg/m  Neurological Exam:    MENTAL STATUS including orientation to time, place, person, recent and remote memory, attention span and concentration, language, and fund of knowledge is normal.  Speech is not dysarthric.  Montreal Cognitive Assessment  11/07/2017 11/10/2014  Visuospatial/ Executive (0/5) 5 5  Naming (0/3) 2 2  Attention: Read list of digits (0/2) 2 2  Attention: Read list of letters (0/1) 1 1  Attention: Serial 7 subtraction starting at 100 (0/3) 3 3    Language: Repeat phrase (0/2) 2 2  Language : Fluency (0/1) 1 0  Abstraction (0/2) 2 2  Delayed Recall (0/5) 3 4  Orientation (0/6) 6 6  Total 27 27  Adjusted Score (based on education) 27 -    CRANIAL NERVES:  Normal conjugate, extra-ocular eye movements in all directions of gaze.  Subtle right ptosis.   MOTOR:  Motor strength is 5/5 in all extremities, except interosseus muscles 5-/5, ABP 4+/5, and toe extensors 5-/5 bilaterally.  Tone is normal.     REFLEXES:  Reflexes are 3+/4 throughout, except absent Achilles bilaterally.  SENSORY:  Vibration absent below the ankles bilaterally, intact at the knees and MCP.  Reduced temperature from mid-calf into her foot.  Pin prick is intact.    COORDINATION/GAIT:  Gait narrow based and stable, unassisted.  Data: EMG 10/25/2013:  There is electrophysiological evidence of a multilevel cervical radiculopathy affecting the right C5, left C7, and bilateral T1 nerve roots/segment, overall mild-to-moderate  degree electrically. There is no evidence generalized sensorimotor polyneuropathy or carpal tunnel syndrome affecting the upper extremities.  Labs 10/05/2013:  Vitamin B12 291*, SPEP/UPEP with IFE - no M protein, copper 139  MRI of the lumbar spine without contrast 12/07/2004: Multilevel spondylosis, advanced at L3-4 L5-S1, and moderate to mild spondylosis L3-4 and mild spinal stenosis at L4-5. An osteophyte at L5-S1 might encroach the extraforaminal portion of the left L5 nerve root. There is no disc herniation.      IMPRESSION/PLAN: 1.  Peripheral neuropathy affecting bilateral lower extremities, mild progression.  Strong family history of neuropathy, likely hereditary (CMT1A?)  - Continue gabapentin 600mg  three times daily  - Patient educated on daily foot inspection, fall prevention, and safety precautions around the home.  2.  Mild cognitive impairment, stable and most likely contributed by anxiety.  Formal neurocongitive testing discussed,  she will thnk about it.  Patient reassured that I do not see any signs of dementia.   She had many questions which I addressed to the best of my ability.  Return to clinic in 1 year  Total time spent reviewingrecords, interview, history/exam, documentation, and coordination of care on day of encounter:  30 min    Thank you for allowing me to participate in patient's care.  If I can answer any additional questions, I would be pleased to do so.    Sincerely,    Arnelle Nale K. Posey Pronto, DO

## 2020-04-27 NOTE — Patient Instructions (Signed)
Return to clinic in 1 year.

## 2020-04-28 DIAGNOSIS — R269 Unspecified abnormalities of gait and mobility: Secondary | ICD-10-CM | POA: Diagnosis not present

## 2020-04-28 DIAGNOSIS — M545 Low back pain, unspecified: Secondary | ICD-10-CM | POA: Diagnosis not present

## 2020-04-28 DIAGNOSIS — M546 Pain in thoracic spine: Secondary | ICD-10-CM | POA: Diagnosis not present

## 2020-05-09 DIAGNOSIS — N952 Postmenopausal atrophic vaginitis: Secondary | ICD-10-CM | POA: Diagnosis not present

## 2020-05-09 DIAGNOSIS — N814 Uterovaginal prolapse, unspecified: Secondary | ICD-10-CM | POA: Diagnosis not present

## 2020-05-09 DIAGNOSIS — N390 Urinary tract infection, site not specified: Secondary | ICD-10-CM | POA: Diagnosis not present

## 2020-05-16 DIAGNOSIS — M546 Pain in thoracic spine: Secondary | ICD-10-CM | POA: Diagnosis not present

## 2020-05-16 DIAGNOSIS — R269 Unspecified abnormalities of gait and mobility: Secondary | ICD-10-CM | POA: Diagnosis not present

## 2020-05-16 DIAGNOSIS — M545 Low back pain, unspecified: Secondary | ICD-10-CM | POA: Diagnosis not present

## 2020-05-24 DIAGNOSIS — M545 Low back pain, unspecified: Secondary | ICD-10-CM | POA: Diagnosis not present

## 2020-05-24 DIAGNOSIS — R269 Unspecified abnormalities of gait and mobility: Secondary | ICD-10-CM | POA: Diagnosis not present

## 2020-05-24 DIAGNOSIS — M546 Pain in thoracic spine: Secondary | ICD-10-CM | POA: Diagnosis not present

## 2020-05-31 DIAGNOSIS — R399 Unspecified symptoms and signs involving the genitourinary system: Secondary | ICD-10-CM | POA: Diagnosis not present

## 2020-06-30 ENCOUNTER — Ambulatory Visit: Payer: PPO | Admitting: Gastroenterology

## 2020-07-10 DIAGNOSIS — N029 Recurrent and persistent hematuria with unspecified morphologic changes: Secondary | ICD-10-CM | POA: Diagnosis not present

## 2020-07-10 DIAGNOSIS — Z01419 Encounter for gynecological examination (general) (routine) without abnormal findings: Secondary | ICD-10-CM | POA: Diagnosis not present

## 2020-07-10 DIAGNOSIS — N898 Other specified noninflammatory disorders of vagina: Secondary | ICD-10-CM | POA: Diagnosis not present

## 2020-07-11 DIAGNOSIS — F3132 Bipolar disorder, current episode depressed, moderate: Secondary | ICD-10-CM | POA: Diagnosis not present

## 2020-07-14 ENCOUNTER — Encounter: Payer: Self-pay | Admitting: Physician Assistant

## 2020-07-14 ENCOUNTER — Ambulatory Visit (INDEPENDENT_AMBULATORY_CARE_PROVIDER_SITE_OTHER): Payer: PPO | Admitting: Physician Assistant

## 2020-07-14 VITALS — BP 136/62 | HR 70 | Ht 65.0 in | Wt 149.0 lb

## 2020-07-14 DIAGNOSIS — K589 Irritable bowel syndrome without diarrhea: Secondary | ICD-10-CM

## 2020-07-14 DIAGNOSIS — K5790 Diverticulosis of intestine, part unspecified, without perforation or abscess without bleeding: Secondary | ICD-10-CM | POA: Diagnosis not present

## 2020-07-14 DIAGNOSIS — R159 Full incontinence of feces: Secondary | ICD-10-CM

## 2020-07-14 MED ORDER — HYOSCYAMINE SULFATE 0.125 MG SL SUBL: 0.1250 mg | SUBLINGUAL_TABLET | Freq: Four times a day (QID) | SUBLINGUAL | 4 refills | Status: DC | PRN

## 2020-07-14 MED ORDER — HYDROCORTISONE (PERIANAL) 2.5 % EX CREA: 1.0000 "application " | TOPICAL_CREAM | Freq: Three times a day (TID) | CUTANEOUS | 1 refills | Status: DC

## 2020-07-14 NOTE — Patient Instructions (Addendum)
If you are age 79 or older, your body mass index should be between 23-30. Your Body mass index is 24.79 kg/m. If this is out of the aforementioned range listed, please consider follow up with your Primary Care Provider.  If you are age 21 or younger, your body mass index should be between 19-25. Your Body mass index is 24.79 kg/m. If this is out of the aformentioned range listed, please consider follow up with your Primary Care Provider.   Hyoscyamine has been sent to your pharmacy, dissolve 1 tablet on the tongue every 6 hours as needed for abdominal pain/spasms/cramping  START Benefiber 1 scoop daily in juice or water  You are due for you Colonoscopy in February of 2023  You can get Pepcid Complete over the counter as needed for indigestion.  Follow up as needed.  Thank you for entrusting me with your care and choosing [facility].  Amy Esterwood, PA-C

## 2020-07-14 NOTE — Progress Notes (Signed)
Subjective:    Patient ID: Theresa Underwood, female    DOB: 1941-02-12, 80 y.o.   MRN: [id number]  HPI "Theresa Underwood"  Is a pleasant 80  yo WF known to Dr. Henrene Pastor.  She was last seen in our office in 2017 by Alonza Bogus, PA-C, with what was felt to be IBS type symptoms with nonspecific mid abdominal discomfort nausea and some mild constipation.  She has history of diverticulosis, hypertension, hypothyroidism, peripheral neuropathy and bipolar disorder. She had EGD in 2013 which was normal and colonoscopy also in 2013 showing moderate diverticulosis and was recommended for 10-year interval follow-up. She had been seen by Dr. Henrene Pastor in 2016 with episodes of loose stools intermittently and had mentioned occasional fecal incontinence with urination.  She comes in today having made the appointment several weeks ago.  She says that she had an episode of abdominal pain , and multiple loose stools at Christmas which she says was "terrible".  That episode resolved within a day or 2 and actually over the past 3 to 4 weeks she has been having more normal bowel movements with 1-2 bowel movements per day.  She describes some heartburn and indigestion which started this morning, she says occasionally she will get heartburn about frequently, denies any dysphagia or odynophagia.  She did take some Tums this morning without much relief.  She had eaten [place] food last night. She continues to have intermittent issues with small partial fecal incontinence which occurs intermittently with urination.  She will passed a small amount of soft stool at times with pressure for urination.  She does relate that she has a prolapsed bladder and wonder if that is contributing.  No full fecal incontinence.  Review of Systems Pertinent positive and negative review of systems were noted in the above HPI section.  All other review of systems was otherwise negative.  Outpatient Encounter Medications as of 07/14/2020  Medication Sig  .  acetaminophen (TYLENOL) 500 MG tablet Take 500 mg by mouth as needed.  . clobetasol cream (TEMOVATE) [id number] % Apply 1 application topically as needed.  . clonazePAM (KLONOPIN) 0.5 MG tablet Take 0.5 mg by mouth at bedtime.  . gabapentin (NEURONTIN) 600 MG tablet TAKE 1 TABLET(600 MG) BY MOUTH THREE TIMES DAILY  . HYDROcodone-acetaminophen (NORCO) 5-325 MG tablet Take 1-2 tablets by mouth every 6 (six) hours as needed for moderate pain.  . hydrocortisone (ANUSOL-HC) 2.5 % rectal cream Place 1 application rectally 3 (three) times daily.  . hyoscyamine (LEVSIN SL) 0.125 MG SL tablet Place 1 tablet (0.125 mg total) under the tongue every 6 (six) hours as needed.  . lamoTRIgine (LAMICTAL) 200 MG tablet Take 200 mg by mouth daily.  Marland Kitchen levothyroxine (SYNTHROID, LEVOTHROID) 112 MCG tablet Take 112 mcg by mouth at bedtime. Brand name only  . lisinopril (PRINIVIL,ZESTRIL) 10 MG tablet Take 10 mg by mouth 3 (three) times a week. Take on Monday, Wednesday, Friday  . mupirocin ointment (BACTROBAN) 2 % apply to affected area three times a day for 10 days WHEN HAS ACTIVE INFECTIONS  . [DISCONTINUED] Albuterol Sulfate (PROAIR RESPICLICK) 123XX123 (90 Base) MCG/ACT AEPB ProAir RespiClick 90 mcg/actuation breath activated  inhale 2 puffs every 4 to 6 hours for cough  . [DISCONTINUED] cephALEXin (KEFLEX) 500 MG capsule Take 1 tablet twice daily x 7 days  . [DISCONTINUED] docusate sodium (COLACE) 100 MG capsule Take 1 capsule (100 mg total) by mouth 2 (two) times daily.  . [DISCONTINUED] hyoscyamine (LEVSIN/SL) 0.125 MG SL tablet  Place 1 tablet (0.125 mg total) under the tongue every 6 (six) hours as needed.  . [DISCONTINUED] Multiple Vitamin (MULTI-VITAMIN) tablet Take by mouth.  . [DISCONTINUED] Multiple Vitamin (MULTI-VITAMINS) TABS Take by mouth.  . [DISCONTINUED] nitrofurantoin (MACRODANTIN) 100 MG capsule TK ONE C PO QD   No facility-administered encounter medications on file as of 07/14/2020.   Allergies  Allergen  Reactions  . Sulfa Antibiotics Hives  . Carbamazepine     REACTION: hives with tegretol  . Sulfonamide Derivatives     REACTION: hives   Patient Active Problem List   Diagnosis Date Noted  . IBS (irritable bowel syndrome) 02/01/2016  . Generalized abdominal pain 02/01/2016  . Nausea without vomiting 02/01/2016  . Cervical radiculopathy 10/25/2013  . Hypothyroidism   . History of TMJ disorder   . Depression   . DDD (degenerative disc disease)   . Arthritis   . Colon polyps   . Anemia   . Spinal stenosis   . Hyperlipidemia   . Bipolar disorder ([place])   . Peripheral neuropathy   . Glucose intolerance (impaired glucose tolerance)   . Hypertension   . Diverticulosis   . Cystocele    Social History   Socioeconomic History  . Marital status: Married    Spouse name: Not on file  . Number of children: 2  . Years of education: 95  . Highest education level: Not on file  Occupational History  . Occupation: Futures trader, and retired Programmer, multimedia: [employer]  Tobacco Use  . Smoking status: Never Smoker  . Smokeless tobacco: Never Used  Vaping Use  . Vaping Use: Never used  Substance and Sexual Activity  . Alcohol use: Yes    Alcohol/week: 0.0 standard drinks    Comment: occ.  . Drug use: No  . Sexual activity: Not on file  Other Topics Concern  . 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    She is married (68 years) and has two grown children.    Highest level of education:  Associates in [place]   She keeps her grandchildren 3 days a week.    Lives in a 2 story home.    Right handed   Social Determinants of Health   Financial Resource Strain: Not on file  Food Insecurity: Not on file  Transportation Needs: Not on file  Physical Activity: Not on file  Stress: Not on file  Social Connections: Not on file  Intimate Partner Violence: Not on file    Ms. Strozier's family history includes Bipolar  disorder in her mother; Colon polyps in her father and mother; Diverticulitis in her sister; Lung disease in her father; Neuropathy in her maternal grandfather and mother; Osteoporosis in her mother; Pancreatic cancer in her maternal grandmother; Peripheral vascular disease in her maternal grandmother; Stroke in her mother.      Objective:    Vitals:   07/14/20 1129  BP: 136/62  Pulse: 70    Physical Exam Well-developed well-nourished  Elderly WF  in no acute distress.  Height, Weight,149 BMI 24.7  HEENT; nontraumatic normocephalic, EOMI, PE R LA, sclera anicteric. Oropharynx ;not done Neck; supple, no JVD Cardiovascular; regular rate and rhythm with S1-S2, no murmur rub or gallop Pulmonary; Clear bilaterally Abdomen; soft,  No focal tenderness, nondistended, no palpable mass or hepatosplenomegaly, bowel sounds are active Rectal;not done today Skin; benign exam, no jaundice rash or appreciable lesions  Extremities; no clubbing cyanosis or edema skin warm and dry Neuro/Psych; alert and oriented x4, grossly nonfocal mood and affect appropriate       Assessment & Plan:   #12 80 year old white female with probable IBS with recent episode of abdominal cramping pain and diarrhea at Christmas time lasting 24 to 48 hours and resolving.  This may have been a viral gastroenteritis.  #2 intermittent partial fecal incontinence generally occurring only with urination and suggestive of pelvic floor laxity and sphincter laxity  #3 colon cancer screening-up-to-date with negative colonoscopy 2013 4.  Bipolar disorder 5.  Hypothyroidism 6.  Peripheral neuropathy 7.  Diverticulosis 8.  Hypertension  Plan; we will start trial of Benefiber 1 scoop every morning in juice or water to bulk stool and see if this will help with partial incontinence episodes Will send prescription for Levsin sublingual dissolve on tongue every 6 hours as needed for episodic abdominal discomfort/loose stools Suggested  Pepcid complete OTC 1-2 as needed for periodic heartburn indigestion She will be due for follow-up colonoscopy in 2023. We had a brief discussion regarding pelvic floor physical therapy, she does not want to pursue this at present.  Asked her to call back should she have any progression of symptoms, and also could proceed with follow-up colonoscopy at that time prior to referral.  She will follow up with Dr. Henrene Pastor or myself on an as-needed basis.  After our visit patient also mention to the nurse that she has occasional external hemorrhoidal symptoms with burning itching.  We will send prescription for hydrocortisone cream 2%, apply 2-3 times daily as needed.  Rolena Knutson Genia Harold PA-C 07/14/2020   Cc: Shon Baton, MD

## 2020-07-14 NOTE — Progress Notes (Signed)
Assessment and plan reviewed 

## 2020-07-31 ENCOUNTER — Encounter (HOSPITAL_COMMUNITY): Payer: Self-pay

## 2020-07-31 ENCOUNTER — Other Ambulatory Visit: Payer: Self-pay

## 2020-07-31 ENCOUNTER — Emergency Department (HOSPITAL_COMMUNITY): Payer: PPO

## 2020-07-31 ENCOUNTER — Telehealth: Payer: Self-pay | Admitting: Physician Assistant

## 2020-07-31 ENCOUNTER — Inpatient Hospital Stay (HOSPITAL_COMMUNITY)
Admission: EM | Admit: 2020-07-31 | Discharge: 2020-08-04 | DRG: 418 | Disposition: A | Discharge status: Home / Self Care | Payer: PPO | Source: Ambulatory Visit | Attending: General Surgery | Admitting: General Surgery

## 2020-07-31 DIAGNOSIS — E871 Hypo-osmolality and hyponatremia: Secondary | ICD-10-CM | POA: Diagnosis present

## 2020-07-31 DIAGNOSIS — K219 Gastro-esophageal reflux disease without esophagitis: Secondary | ICD-10-CM | POA: Diagnosis present

## 2020-07-31 DIAGNOSIS — A419 Sepsis, unspecified organism: Secondary | ICD-10-CM | POA: Diagnosis not present

## 2020-07-31 DIAGNOSIS — R932 Abnormal findings on diagnostic imaging of liver and biliary tract: Secondary | ICD-10-CM

## 2020-07-31 DIAGNOSIS — I10 Essential (primary) hypertension: Secondary | ICD-10-CM | POA: Diagnosis present

## 2020-07-31 DIAGNOSIS — Z8249 Family history of ischemic heart disease and other diseases of the circulatory system: Secondary | ICD-10-CM | POA: Diagnosis not present

## 2020-07-31 DIAGNOSIS — R7881 Bacteremia: Secondary | ICD-10-CM | POA: Diagnosis present

## 2020-07-31 DIAGNOSIS — K819 Cholecystitis, unspecified: Secondary | ICD-10-CM | POA: Diagnosis not present

## 2020-07-31 DIAGNOSIS — R7989 Other specified abnormal findings of blood chemistry: Secondary | ICD-10-CM | POA: Diagnosis not present

## 2020-07-31 DIAGNOSIS — K8051 Calculus of bile duct without cholangitis or cholecystitis with obstruction: Secondary | ICD-10-CM | POA: Diagnosis not present

## 2020-07-31 DIAGNOSIS — K8050 Calculus of bile duct without cholangitis or cholecystitis without obstruction: Secondary | ICD-10-CM

## 2020-07-31 DIAGNOSIS — K8010 Calculus of gallbladder with chronic cholecystitis without obstruction: Secondary | ICD-10-CM | POA: Diagnosis not present

## 2020-07-31 DIAGNOSIS — R739 Hyperglycemia, unspecified: Secondary | ICD-10-CM | POA: Diagnosis present

## 2020-07-31 DIAGNOSIS — D649 Anemia, unspecified: Secondary | ICD-10-CM | POA: Diagnosis present

## 2020-07-31 DIAGNOSIS — R1013 Epigastric pain: Secondary | ICD-10-CM | POA: Diagnosis not present

## 2020-07-31 DIAGNOSIS — K5730 Diverticulosis of large intestine without perforation or abscess without bleeding: Secondary | ICD-10-CM | POA: Diagnosis present

## 2020-07-31 DIAGNOSIS — K8070 Calculus of gallbladder and bile duct without cholecystitis without obstruction: Secondary | ICD-10-CM | POA: Diagnosis not present

## 2020-07-31 DIAGNOSIS — Z96653 Presence of artificial knee joint, bilateral: Secondary | ICD-10-CM | POA: Diagnosis present

## 2020-07-31 DIAGNOSIS — B961 Klebsiella pneumoniae [K. pneumoniae] as the cause of diseases classified elsewhere: Secondary | ICD-10-CM | POA: Diagnosis present

## 2020-07-31 DIAGNOSIS — Z4889 Encounter for other specified surgical aftercare: Secondary | ICD-10-CM | POA: Diagnosis not present

## 2020-07-31 DIAGNOSIS — M5412 Radiculopathy, cervical region: Secondary | ICD-10-CM | POA: Diagnosis present

## 2020-07-31 DIAGNOSIS — Z882 Allergy status to sulfonamides status: Secondary | ICD-10-CM | POA: Diagnosis not present

## 2020-07-31 DIAGNOSIS — K8020 Calculus of gallbladder without cholecystitis without obstruction: Secondary | ICD-10-CM | POA: Diagnosis present

## 2020-07-31 DIAGNOSIS — K8065 Calculus of gallbladder and bile duct with chronic cholecystitis with obstruction: Secondary | ICD-10-CM | POA: Diagnosis present

## 2020-07-31 DIAGNOSIS — Z79899 Other long term (current) drug therapy: Secondary | ICD-10-CM

## 2020-07-31 DIAGNOSIS — E785 Hyperlipidemia, unspecified: Secondary | ICD-10-CM | POA: Diagnosis present

## 2020-07-31 DIAGNOSIS — K589 Irritable bowel syndrome without diarrhea: Secondary | ICD-10-CM | POA: Diagnosis present

## 2020-07-31 DIAGNOSIS — Z419 Encounter for procedure for purposes other than remedying health state, unspecified: Secondary | ICD-10-CM

## 2020-07-31 DIAGNOSIS — Z823 Family history of stroke: Secondary | ICD-10-CM

## 2020-07-31 DIAGNOSIS — R1110 Vomiting, unspecified: Secondary | ICD-10-CM | POA: Diagnosis not present

## 2020-07-31 DIAGNOSIS — N390 Urinary tract infection, site not specified: Secondary | ICD-10-CM | POA: Diagnosis present

## 2020-07-31 DIAGNOSIS — K8043 Calculus of bile duct with acute cholecystitis with obstruction: Secondary | ICD-10-CM | POA: Diagnosis not present

## 2020-07-31 DIAGNOSIS — E039 Hypothyroidism, unspecified: Secondary | ICD-10-CM | POA: Diagnosis present

## 2020-07-31 DIAGNOSIS — Z79890 Hormone replacement therapy (postmenopausal): Secondary | ICD-10-CM

## 2020-07-31 DIAGNOSIS — Z20822 Contact with and (suspected) exposure to covid-19: Secondary | ICD-10-CM | POA: Diagnosis present

## 2020-07-31 DIAGNOSIS — B9620 Unspecified Escherichia coli [E. coli] as the cause of diseases classified elsewhere: Secondary | ICD-10-CM | POA: Diagnosis present

## 2020-07-31 DIAGNOSIS — Z8371 Family history of colonic polyps: Secondary | ICD-10-CM | POA: Diagnosis not present

## 2020-07-31 DIAGNOSIS — Z800 Family history of malignant neoplasm of digestive organs: Secondary | ICD-10-CM

## 2020-07-31 DIAGNOSIS — R8271 Bacteriuria: Secondary | ICD-10-CM | POA: Diagnosis not present

## 2020-07-31 DIAGNOSIS — R1010 Upper abdominal pain, unspecified: Secondary | ICD-10-CM | POA: Diagnosis not present

## 2020-07-31 DIAGNOSIS — G629 Polyneuropathy, unspecified: Secondary | ICD-10-CM | POA: Diagnosis present

## 2020-07-31 DIAGNOSIS — K838 Other specified diseases of biliary tract: Secondary | ICD-10-CM | POA: Diagnosis not present

## 2020-07-31 DIAGNOSIS — R1011 Right upper quadrant pain: Secondary | ICD-10-CM

## 2020-07-31 DIAGNOSIS — Z8262 Family history of osteoporosis: Secondary | ICD-10-CM | POA: Diagnosis not present

## 2020-07-31 DIAGNOSIS — F319 Bipolar disorder, unspecified: Secondary | ICD-10-CM | POA: Diagnosis present

## 2020-07-31 LAB — URINALYSIS, ROUTINE W REFLEX MICROSCOPIC
Bilirubin Urine: NEGATIVE
Glucose, UA: NEGATIVE mg/dL
Hgb urine dipstick: NEGATIVE
Ketones, ur: NEGATIVE mg/dL
Nitrite: NEGATIVE
Protein, ur: NEGATIVE mg/dL
Specific Gravity, Urine: 1.012 (ref 1.005–1.030)
pH: 6 (ref 5.0–8.0)

## 2020-07-31 LAB — COMPREHENSIVE METABOLIC PANEL
ALT: 137 U/L — ABNORMAL HIGH (ref 0–44)
AST: 263 U/L — ABNORMAL HIGH (ref 15–41)
Albumin: 3.8 g/dL (ref 3.5–5.0)
Alkaline Phosphatase: 125 U/L (ref 38–126)
Anion gap: 11 (ref 5–15)
BUN: 15 mg/dL (ref 8–23)
CO2: 21 mmol/L — ABNORMAL LOW (ref 22–32)
Calcium: 8.7 mg/dL — ABNORMAL LOW (ref 8.9–10.3)
Chloride: 104 mmol/L (ref 98–111)
Creatinine, Ser: 1 mg/dL (ref 0.44–1.00)
GFR, Estimated: 58 mL/min — ABNORMAL LOW (ref >60)
Glucose, Bld: 158 mg/dL — ABNORMAL HIGH (ref 70–99)
Potassium: 3.7 mmol/L (ref 3.5–5.1)
Sodium: 136 mmol/L (ref 135–145)
Total Bilirubin: 1.4 mg/dL — ABNORMAL HIGH (ref 0.3–1.2)
Total Protein: 7.1 g/dL (ref 6.5–8.1)

## 2020-07-31 LAB — TROPONIN I (HIGH SENSITIVITY)
Troponin I (High Sensitivity): 8 ng/L (ref <18)
Troponin I (High Sensitivity): 10 ng/L (ref <18)

## 2020-07-31 LAB — CBC WITH DIFFERENTIAL/PLATELET
Abs Immature Granulocytes: 0.03 10*3/uL (ref 0.00–0.07)
Basophils Absolute: 0 10*3/uL (ref 0.0–0.1)
Basophils Relative: 0 %
Eosinophils Absolute: 0.1 10*3/uL (ref 0.0–0.5)
Eosinophils Relative: 1 %
HCT: 34.1 % — ABNORMAL LOW (ref 36.0–46.0)
Hemoglobin: 10.6 g/dL — ABNORMAL LOW (ref 12.0–15.0)
Immature Granulocytes: 0 %
Lymphocytes Relative: 11 %
Lymphs Abs: 1 10*3/uL (ref 0.7–4.0)
MCH: 30.5 pg (ref 26.0–34.0)
MCHC: 31.1 g/dL (ref 30.0–36.0)
MCV: 98 fL (ref 80.0–100.0)
Monocytes Absolute: 0.1 10*3/uL (ref 0.1–1.0)
Monocytes Relative: 1 %
Neutro Abs: 7.9 10*3/uL — ABNORMAL HIGH (ref 1.7–7.7)
Neutrophils Relative %: 87 %
Platelets: 434 10*3/uL — ABNORMAL HIGH (ref 150–400)
RBC: 3.48 MIL/uL — ABNORMAL LOW (ref 3.87–5.11)
RDW: 12.8 % (ref 11.5–15.5)
WBC: 9 10*3/uL (ref 4.0–10.5)
nRBC: 0 % (ref 0.0–0.2)

## 2020-07-31 LAB — LACTIC ACID, PLASMA
Lactic Acid, Venous: 2.4 mmol/L (ref 0.5–1.9)
Lactic Acid, Venous: 2.6 mmol/L (ref 0.5–1.9)

## 2020-07-31 LAB — LIPASE, BLOOD: Lipase: 100 U/L — ABNORMAL HIGH (ref 11–51)

## 2020-07-31 LAB — RESP PANEL BY RT-PCR (FLU A&B, COVID) ARPGX2
Influenza A by PCR: NEGATIVE
Influenza B by PCR: NEGATIVE
SARS Coronavirus 2 by RT PCR: NEGATIVE

## 2020-07-31 MED ORDER — METOPROLOL TARTRATE 5 MG/5ML IV SOLN: 5.0000 mg | Freq: Four times a day (QID) | INTRAVENOUS | Status: DC | PRN

## 2020-07-31 MED ORDER — ACETAMINOPHEN 500 MG PO TABS: 1000.0000 mg | ORAL_TABLET | Freq: Four times a day (QID) | ORAL | Status: DC | PRN

## 2020-07-31 MED ORDER — SODIUM CHLORIDE 0.9 % IV SOLN
2.0000 g | INTRAVENOUS | Status: DC
  Administered 2020-08-01 – 2020-08-03 (×3): 2 g via INTRAVENOUS
  Filled 2020-07-31 (×3): qty 2

## 2020-07-31 MED ORDER — LISINOPRIL 10 MG PO TABS
10.0000 mg | ORAL_TABLET | ORAL | Status: DC
  Administered 2020-08-02 – 2020-08-04 (×2): 10 mg via ORAL
  Filled 2020-07-31 (×2): qty 1

## 2020-07-31 MED ORDER — LAMOTRIGINE 100 MG PO TABS
200.0000 mg | ORAL_TABLET | Freq: Every day | ORAL | Status: DC
Start: 2020-08-01 — End: 2020-08-04
  Administered 2020-08-01 – 2020-08-04 (×4): 200 mg via ORAL
  Filled 2020-07-31 (×4): qty 2

## 2020-07-31 MED ORDER — ENOXAPARIN SODIUM 40 MG/0.4ML ~~LOC~~ SOLN
40.0000 mg | SUBCUTANEOUS | Status: DC
  Administered 2020-07-31: 40 mg via SUBCUTANEOUS
  Filled 2020-07-31: qty 0.4

## 2020-07-31 MED ORDER — KCL IN DEXTROSE-NACL 20-5-0.45 MEQ/L-%-% IV SOLN
INTRAVENOUS | Status: DC
  Filled 2020-07-31 (×4): qty 1000

## 2020-07-31 MED ORDER — SODIUM CHLORIDE 0.9 % IV BOLUS
1000.0000 mL | Freq: Once | INTRAVENOUS | Status: AC
  Administered 2020-07-31: 1000 mL via INTRAVENOUS

## 2020-07-31 MED ORDER — LEVOTHYROXINE SODIUM 112 MCG PO TABS
112.0000 ug | ORAL_TABLET | Freq: Every day | ORAL | Status: DC
  Administered 2020-08-01 – 2020-08-04 (×3): 112 ug via ORAL
  Filled 2020-07-31 (×5): qty 1

## 2020-07-31 MED ORDER — ONDANSETRON 4 MG PO TBDP: 4.0000 mg | ORAL_TABLET | Freq: Four times a day (QID) | ORAL | Status: DC | PRN

## 2020-07-31 MED ORDER — METRONIDAZOLE IN NACL 5-0.79 MG/ML-% IV SOLN
500.0000 mg | Freq: Once | INTRAVENOUS | Status: AC
  Administered 2020-07-31: 500 mg via INTRAVENOUS
  Filled 2020-07-31: qty 100

## 2020-07-31 MED ORDER — GABAPENTIN 600 MG PO TABS
600.0000 mg | ORAL_TABLET | Freq: Three times a day (TID) | ORAL | Status: DC
Start: 2020-07-31 — End: 2020-08-04
  Administered 2020-07-31 – 2020-08-04 (×11): 600 mg via ORAL
  Filled 2020-07-31 (×13): qty 1

## 2020-07-31 MED ORDER — SODIUM CHLORIDE 0.9 % IV SOLN
1.0000 g | Freq: Once | INTRAVENOUS | Status: AC
  Administered 2020-07-31: 1 g via INTRAVENOUS
  Filled 2020-07-31: qty 10

## 2020-07-31 MED ORDER — MORPHINE SULFATE (PF) 2 MG/ML IV SOLN
1.0000 mg | INTRAVENOUS | Status: DC | PRN
  Administered 2020-08-02: 2 mg via INTRAVENOUS
  Filled 2020-07-31 (×2): qty 1

## 2020-07-31 MED ORDER — CLONAZEPAM 0.5 MG PO TABS
0.5000 mg | ORAL_TABLET | Freq: Every day | ORAL | Status: DC
  Administered 2020-07-31 – 2020-08-03 (×4): 0.5 mg via ORAL
  Filled 2020-07-31 (×4): qty 1

## 2020-07-31 MED ORDER — TRAMADOL HCL 50 MG PO TABS: 50.0000 mg | ORAL_TABLET | Freq: Four times a day (QID) | ORAL | Status: DC | PRN

## 2020-07-31 MED ORDER — SIMETHICONE 80 MG PO CHEW: 40.0000 mg | CHEWABLE_TABLET | Freq: Four times a day (QID) | ORAL | Status: DC | PRN

## 2020-07-31 MED ORDER — ACETAMINOPHEN 325 MG PO TABS
650.0000 mg | ORAL_TABLET | Freq: Once | ORAL | Status: AC
  Administered 2020-07-31: 650 mg via ORAL
  Filled 2020-07-31: qty 2

## 2020-07-31 MED ORDER — ONDANSETRON HCL 4 MG/2ML IJ SOLN: 4.0000 mg | Freq: Four times a day (QID) | INTRAMUSCULAR | Status: DC | PRN

## 2020-07-31 MED ORDER — IOHEXOL 350 MG/ML SOLN
100.0000 mL | Freq: Once | INTRAVENOUS | Status: AC | PRN
  Administered 2020-07-31: 100 mL via INTRAVENOUS

## 2020-07-31 NOTE — Telephone Encounter (Signed)
Called patient. No answer. Left a message of my call on her voicemail.

## 2020-07-31 NOTE — ED Notes (Signed)
Theresa Underwood, [place] notified of elevated lactic acid of 2.4 along with rectal temp of 101.3

## 2020-07-31 NOTE — ED Provider Notes (Signed)
Called by Gen surgery PA, Claiborne Billings, who states gen surgery will admit.   Dr. Roosevelt Locks informed that he does not need to admit pt.    Franchot Heidelberg, PA-C 07/31/20 1558    Gareth Morgan, MD 08/01/20 [phone]

## 2020-07-31 NOTE — Progress Notes (Signed)
Pt is a DNR. Husband will bring paperwork tomorrow morning. Pt stated that if she "goes out during surgery, she does not want to be resuscitated."  Verified by Delcie Roch, RN.

## 2020-07-31 NOTE — ED Notes (Signed)
Pt taken to CT.

## 2020-07-31 NOTE — ED Triage Notes (Signed)
Pt arrives from urgent care w/ complaints of upper abdominal pain that occurred upon waking up this morning that seems to radiate to the chest. Pt states "it just hurts" when asked to describe it. Pt rating pain 5 out of 10. Pt does report nausea but no vomiting or diarrhea. Pt denies cough/ chills/ fever today, but reports an episode about 2 weeks ago of having a fever of 100.8.

## 2020-07-31 NOTE — ED Notes (Signed)
Dinner Tray Ordered @ [phone].

## 2020-07-31 NOTE — Progress Notes (Signed)
Pt arrived on unit at 1830. Pt ambulated from ED stretcher to bed in room. Standby assist.  Pt a/o x4. Chose to sit in chair until bedtime.  Husband bedside at arrival. He will bring DNR paperwork to hospital by 1000 on 02/15, prior to patient procedure.  Pt waiting to sign consent. She is not sure if they are doing an MRI first to verify there isn't a stone clogging a duct that would mean GI would need to get involved with the procedure.

## 2020-07-31 NOTE — Telephone Encounter (Signed)
Pt is requesting a call back from a nurse to discuss her upper abdominal pain, pt would like some advice on what to do.

## 2020-07-31 NOTE — H&P (Signed)
Theresa Underwood Oct 04, 1940  [id number].    Chief Complaint/Reason for Consult: cholelithiasis  HPI:  This is a very pleasant 80 yo white female with a history of bipolar d/o, HTN, hypothyroidism, peripheral neuropathy, and IBS.  She is followed by Dr. Henrene Pastor and [facility] for her IBS and c-scope needs.  She frequently has soft stools multiple times a day and then some days normal BMs.  She had an episode of maybe some abdominal discomfort around Christmas and one other time a couple of weeks ago according to her family.  She has always attributed this to her IBS.  Last night she ate a spicy bratwurst with sour kraut and a beer.  She had some epigastric discomfort before going to bed that she states was a burning type pain.  This did not prevent her from falling asleep.  She slept all night and woke up at 7:15am this morning with horrible epigastric burning pain that radiated up to her left chest.  She went to the urgent care who ruled out cardiac origin.  She was referred to the ED and while getting in her car, she developed nausea and vomiting.  She vomited several more times since being here in the ED.  She also developed a temp of 101 which resolved with Tylenol.  She had a work up with a CT of her A/P which revealed a dilated CBD of 1.1cm but no obvious defect along with some possible gb wall thickening.  Her LFTs are elevated along with slight elevation of her TB at 1.4.  We have ben asked to see her for further evaluation.  Her pain is currently resolved.  ROS: ROS: Please see HPI, otherwise all other systems have been reviewed and are negative.  Family History  Problem Relation Age of Onset  . Bipolar disorder Mother   . Osteoporosis Mother   . Colon polyps Mother   . Stroke Mother        Deceased, 26  . Neuropathy Mother   . Peripheral vascular disease Maternal Grandmother   . Pancreatic cancer Maternal Grandmother   . Colon polyps Father   . Lung disease Father        Deceased,  2  . Diverticulitis Sister   . Neuropathy Maternal Grandfather   . Colon cancer Neg Hx   . Breast cancer Neg Hx     Past Medical History:  Diagnosis Date  . Anemia   . Arthritis   . Bipolar disorder ([place])   . Burning pain    where bladder pushes thru vaginal wall  . Cholelithiasis   . Colon polyps   . Complication of anesthesia    slight trouble turning head side to side  . Contusion of right great toe with damage to nail    right toenail black for months  . Cystocele   . DDD (degenerative disc disease)   . Depression   . Diverticulosis   . Ganglion cyst of right foot    due to bone spurs  . GERD (gastroesophageal reflux disease)   . Glucose intolerance (impaired glucose tolerance)   . Hemorrhoids   . History of bladder infections   . History of TMJ disorder    no problems in years, wears bite guard when tmj occurs  . HOH (hard of hearing)    left more than right, mild  . Hyperlipidemia   . Hypertension   . Hypothyroidism   . Peripheral neuropathy    bilateral feet,  Dr. Posey Pronto  . Prolapsed bladder   . Spinal stenosis   . Tinnitus    left ear drum beat    Past Surgical History:  Procedure Laterality Date  . ABDOMINAL HYSTERECTOMY  2002   total  . CYSTOCELE REPAIR N/A 12/05/2015   Procedure: CYSTOSCOPY, ANTERIOR REPAIR (CYSTOCELE), VAULT PROLAPSE AND GRAFT;  Surgeon: Bjorn Loser, MD;  Location: [facility];  Service: Urology;  Laterality: N/A;  . TONSILLECTOMY AND ADENOIDECTOMY     as a child  . TOTAL KNEE ARTHROPLASTY     bi-lateral 2000,2007  . TUBAL LIGATION  1976  . VAGINAL PROLAPSE REPAIR N/A 12/05/2015   Procedure: VAGINAL VAULT SUSPENSION;  Surgeon: Bjorn Loser, MD;  Location: [facility];  Service: Urology;  Laterality: N/A;    Social History:  reports that she has never smoked. She has never used smokeless tobacco. She reports current alcohol use. She reports that she does not use drugs.  Allergies:  Allergies  Allergen Reactions  . Sulfa  Antibiotics Hives  . Carbamazepine     REACTION: hives with tegretol  . Sulfonamide Derivatives     REACTION: hives    (Not in a hospital admission)    Physical Exam: Blood pressure 121/77, pulse (!) 103, temperature (!) 101.3 F (38.5 C), temperature source Rectal, resp. rate 17, height 5\' 5"  (1.651 m), weight 68 kg, SpO2 95 %. General: pleasant, WD, WN white female who is laying in bed in NAD HEENT: head is normocephalic, atraumatic.  Sclera are noninjected.  PERRL.  Ears and nose without any masses or lesions.  Mouth is pink and moist Heart: regular, rate, and rhythm, mildly tachy.  Normal s1,s2. No obvious gallops, or rubs noted.  + murmur.  Palpable radial and pedal pulses bilaterally Lungs: CTAB, no wheezes, rhonchi, or rales noted.  Respiratory effort nonlabored Abd: soft, NT, ND, +BS, no masses, hernias, or organomegaly MS: all 4 extremities are symmetrical with no cyanosis, clubbing, or edema. Skin: warm and dry with no masses, lesions, or rashes Neuro: Cranial nerves 2-12 grossly intact, sensation is normal throughout Psych: A&Ox3 with an appropriate affect.   Results for orders placed or performed during the hospital encounter of 07/31/20 (from the past 48 hour(s))  Urinalysis, Routine w reflex microscopic Urine, Random     Status: Abnormal   Collection Time: 07/31/20 11:02 AM  Result Value Ref Range   Color, Urine YELLOW YELLOW   APPearance CLOUDY (A) CLEAR   Specific Gravity, Urine 1.012 1.005 - 1.030   pH 6.0 5.0 - 8.0   Glucose, UA NEGATIVE NEGATIVE mg/dL   Hgb urine dipstick NEGATIVE NEGATIVE   Bilirubin Urine NEGATIVE NEGATIVE   Ketones, ur NEGATIVE NEGATIVE mg/dL   Protein, ur NEGATIVE NEGATIVE mg/dL   Nitrite NEGATIVE NEGATIVE   Leukocytes,Ua MODERATE (A) NEGATIVE   RBC / HPF 0-5 0 - 5 RBC/hpf   WBC, UA 21-50 0 - 5 WBC/hpf   Bacteria, UA MANY (A) NONE SEEN   Squamous Epithelial / LPF 21-50 0 - 5   Mucus PRESENT     Comment: Performed at [facility], [address] [place], [place] [address]  CBC with Differential/Platelet     Status: Abnormal   Collection Time: 07/31/20 11:15 AM  Result Value Ref Range   WBC 9.0 4.0 - 10.5 K/uL   RBC 3.48 (L) 3.87 - 5.11 MIL/uL   Hemoglobin 10.6 (L) 12.0 - 15.0 g/dL   HCT 34.1 (L) 36.0 - 46.0 %   MCV  98.0 80.0 - 100.0 fL   MCH 30.5 26.0 - 34.0 pg   MCHC 31.1 30.0 - 36.0 g/dL   RDW 12.8 11.5 - 15.5 %   Platelets 434 (H) 150 - 400 K/uL   nRBC 0.0 0.0 - 0.2 %   Neutrophils Relative % 87 %   Neutro Abs 7.9 (H) 1.7 - 7.7 K/uL   Lymphocytes Relative 11 %   Lymphs Abs 1.0 0.7 - 4.0 K/uL   Monocytes Relative 1 %   Monocytes Absolute 0.1 0.1 - 1.0 K/uL   Eosinophils Relative 1 %   Eosinophils Absolute 0.1 0.0 - 0.5 K/uL   Basophils Relative 0 %   Basophils Absolute 0.0 0.0 - 0.1 K/uL   Immature Granulocytes 0 %   Abs Immature Granulocytes 0.03 0.00 - 0.07 K/uL    Comment: Performed at [facility] [address] [place], [place] [address]  Comprehensive metabolic panel     Status: Abnormal   Collection Time: 07/31/20 11:15 AM  Result Value Ref Range   Sodium 136 135 - 145 mmol/L   Potassium 3.7 3.5 - 5.1 mmol/L   Chloride 104 98 - 111 mmol/L   CO2 21 (L) 22 - 32 mmol/L   Glucose, Bld 158 (H) 70 - 99 mg/dL    Comment: Glucose reference range applies only to samples taken after fasting for at least 8 hours.   BUN 15 8 - 23 mg/dL   Creatinine, Ser 1.00 0.44 - 1.00 mg/dL   Calcium 8.7 (L) 8.9 - 10.3 mg/dL   Total Protein 7.1 6.5 - 8.1 g/dL   Albumin 3.8 3.5 - 5.0 g/dL   AST 263 (H) 15 - 41 U/L   ALT 137 (H) 0 - 44 U/L   Alkaline Phosphatase 125 38 - 126 U/L   Total Bilirubin 1.4 (H) 0.3 - 1.2 mg/dL   GFR, Estimated 58 (L) >60 mL/min    Comment: (NOTE) Calculated using the CKD-EPI Creatinine Equation (2021)    Anion gap 11 5 - 15    Comment: Performed at [facility] [address], [place], [place] [address]  Lipase, blood     Status: Abnormal   Collection Time:  07/31/20 11:15 AM  Result Value Ref Range   Lipase 100 (H) 11 - 51 U/L    Comment: Performed at [facility] [address] [place], [place] [address]  Troponin I (High Sensitivity)     Status: None   Collection Time: 07/31/20 11:15 AM  Result Value Ref Range   Troponin I (High Sensitivity) 8 <18 ng/L    Comment: (NOTE) Elevated high sensitivity troponin I (hsTnI) values and significant  changes across serial measurements may suggest ACS but many other  chronic and acute conditions are known to elevate hsTnI results.  Refer to the "Links" section for chest pain algorithms and additional  guidance. Performed at [facility], [place] [address] [place], [place] [address]   Lactic acid, plasma     Status: Abnormal   Collection Time: 07/31/20 11:15 AM  Result Value Ref Range   Lactic Acid, Venous 2.4 (HH) 0.5 - 1.9 mmol/L    Comment: CRITICAL RESULT CALLED TO, READ BACK BY AND VERIFIED WITH: E SHAFFER RN [phone] [id number] BY A BENNETT Performed at [facility], [place] [address] [place], [place] [address]   Lactic acid, plasma     Status: Abnormal   Collection Time: 07/31/20  1:20 PM  Result Value  Ref Range   Lactic Acid, Venous 2.6 (HH) 0.5 - 1.9 mmol/L    Comment: CRITICAL VALUE NOTED.  VALUE IS CONSISTENT WITH PREVIOUSLY REPORTED AND CALLED VALUE. Performed at [facility], [place] [address], [place], [place] [address]   Troponin I (High Sensitivity)     Status: None   Collection Time: 07/31/20  1:20 PM  Result Value Ref Range   Troponin I (High Sensitivity) 10 <18 ng/L    Comment: (NOTE) Elevated high sensitivity troponin I (hsTnI) values and significant  changes across serial measurements may suggest ACS but many other  chronic and acute conditions are known to elevate hsTnI results.  Refer to the "Links" section for chest pain algorithms and additional  guidance. Performed at [facility], [place] [address], [place], [place] [address]   Resp Panel by  RT-PCR (Flu A&B, Covid) Nasopharyngeal Swab     Status: None   Collection Time: 07/31/20  1:22 PM   Specimen: Nasopharyngeal Swab; Nasopharyngeal(NP) swabs in vial transport medium  Result Value Ref Range   SARS Coronavirus 2 by RT PCR NEGATIVE NEGATIVE    Comment: (NOTE) SARS-CoV-2 target nucleic acids are NOT DETECTED.  The SARS-CoV-2 RNA is generally detectable in upper respiratory specimens during the acute phase of infection. The lowest concentration of SARS-CoV-2 viral copies this assay can detect is 138 copies/mL. A negative result does not preclude SARS-Cov-2 infection and should not be used as the sole basis for treatment or other patient management decisions. A negative result may occur with  improper specimen collection/handling, submission of specimen other than nasopharyngeal swab, presence of viral mutation(s) within the areas targeted by this assay, and inadequate number of viral copies(<138 copies/mL). A negative result must be combined with clinical observations, patient history, and epidemiological information. The expected result is Negative.  Fact Sheet for Patients:  EntrepreneurPulse.com.au  Fact Sheet for Healthcare Providers:  IncredibleEmployment.be  This test is no t yet approved or cleared by the [place] FDA and  has been authorized for detection and/or diagnosis of SARS-CoV-2 by FDA under an Emergency Use Authorization (EUA). This EUA will remain  in effect (meaning this test can be used) for the duration of the COVID-19 declaration under Section 564(b)(1) of the Act, 21 U.S.C.section 360bbb-3(b)(1), unless the authorization is terminated  or revoked sooner.       Influenza A by PCR NEGATIVE NEGATIVE   Influenza B by PCR NEGATIVE NEGATIVE    Comment: (NOTE) The Xpert Xpress SARS-CoV-2/FLU/RSV plus assay is intended as an aid in the diagnosis of influenza from Nasopharyngeal swab specimens and should not be  used as a sole basis for treatment. Nasal washings and aspirates are unacceptable for Xpert Xpress SARS-CoV-2/FLU/RSV testing.  Fact Sheet for Patients: EntrepreneurPulse.com.au  Fact Sheet for Healthcare Providers: IncredibleEmployment.be  This test is not yet approved or cleared by the [place] FDA and has been authorized for detection and/or diagnosis of SARS-CoV-2 by FDA under an Emergency Use Authorization (EUA). This EUA will remain in effect (meaning this test can be used) for the duration of the COVID-19 declaration under Section 564(b)(1) of the Act, 21 U.S.C. section 360bbb-3(b)(1), unless the authorization is terminated or revoked.  Performed at [facility], [place] [address], [place], [place] [address]    DG Chest 2 View  Result Date: 07/31/2020 CLINICAL DATA:  Epigastric pain, vomiting EXAM: CHEST - 2 VIEW COMPARISON:  09/23/2005 FINDINGS: Heart and mediastinal contours are within normal limits. No focal opacities  or effusions. No acute bony abnormality. IMPRESSION: No active cardiopulmonary disease. Electronically Signed   By: Rolm Baptise M.D.   On: 07/31/2020 11:50   CT ABDOMEN PELVIS W CONTRAST  Result Date: 07/31/2020 CLINICAL DATA:  Epigastric pain and fever. Possible urinary tract infection. EXAM: CT ABDOMEN AND PELVIS WITH CONTRAST TECHNIQUE: Multidetector CT imaging of the abdomen and pelvis was performed using the standard protocol following bolus administration of intravenous contrast. CONTRAST:  164mL OMNIPAQUE IOHEXOL 350 MG/ML SOLN COMPARISON:  Abdominal ultrasound 06/13/2005 FINDINGS: Lower chest: Right lower lobe peribronchovascular nodule measuring 5 by 4 by 3 mm (volume = 30 mm^3) on image 20 of series 5. A left lower lobe nodule near the diaphragm measures 8 by 6 by 7 mm (volume = 200 mm^3) on image 17 series 5. Descending thoracic aortic atherosclerotic calcification along with right coronary atherosclerosis.  Mitral valve calcification. Hepatobiliary: Dorsal gallbladder wall thickening versus pericholecystic fluid. Clustered dependent gallstones measuring up to 0.8 cm in diameter. Dilated common bile duct at 1.1 cm in diameter on image 45 series 6, without definite choledocholithiasis visible. Punctate calcification in the dome of the liver compatible with old granulomatous disease. Minimal periportal edema. Pancreas: Unremarkable Spleen: Unremarkable Adrenals/Urinary Tract: Small amount of gas in the urinary bladder likely from recent catheterization. No substantial degree of urinary bladder wall thickening. Small hypodense lesions in the left kidney are technically too small to characterize although statistically likely to be cysts. Stomach/Bowel: Small periampullary duodenal diverticulum. Normal appendix. Sigmoid colon diverticulosis. No dilated bowel. No acute diverticulitis. Vascular/Lymphatic: Aortoiliac atherosclerotic vascular disease. Reproductive: Uterus absent.  Adnexa unremarkable. Other: No supplemental non-categorized findings. Musculoskeletal: Levoconvex lumbar scoliosis with rotary component. Mild chondrocalcinosis along the acetabular labrum. Lumbar spondylosis and degenerative disc disease contributing to left foraminal narrowing at L4-5 and L5-S1, and right foraminal narrowing at L3-4 and L4-5. Low position of the anorectal junction compatible with pelvic floor laxity. IMPRESSION: 1. Dorsal gallbladder wall thickening versus pericholecystic fluid. Cholelithiasis. Correlate clinically in assessing for acute cholecystitis. 2. Dilated common bile duct at 1.1 cm in diameter without definite choledocholithiasis visible. 3. If clinically warranted, nuclear medicine hepatobiliary scan could be utilized to assess for patency of the cystic duct and common bile duct. 4. Bilateral lower lobe pulmonary nodules, the largest measuring 200 mm in diameter in the left lower lobe. Non-contrast chest CT at 6-12 months is  recommended. If the nodule is stable at time of repeat CT, then future CT at 18-24 months (from today's scan) is considered optional for low-risk patients, but is recommended for high-risk patients. This recommendation follows the consensus statement: Guidelines for Management of Incidental Pulmonary Nodules Detected on CT Images: From the Fleischner Society 2017; Radiology 2017; 284:228-243. 5. Other imaging findings of potential clinical significance: Coronary atherosclerosis. Mitral valve calcification. Sigmoid colon diverticulosis. Low position of the anorectal junction compatible with pelvic floor laxity. Lumbar spondylosis and degenerative disc disease causing multilevel impingement. Mild nonspecific periportal edema. 6. Aortic atherosclerosis. Aortic Atherosclerosis (ICD10-I70.0). Electronically Signed   By: Van Clines M.D.   On: 07/31/2020 14:47      Assessment/Plan HTN Bipolar disorder Hypothyroidism Peripheral neuropathy IBS --resume home meds--  Cholelithiasis [name] CT scan, labs, and chart have been reviewed. It is our suspicion that the patient has likely passed a gallstone and that is why her pain has currently resolved.  We will recheck labs in the morning and hold off on an MRCP for now.  We will plan for a lap chole with IOC tomorrow.  If her IOC is positive at that time, then we will consult GI.  This has all been discussed with the patient and her family that are present.  A picture has been drawn as well to further explain this.  They understand and agree with this plan.  We have explained the procedure, risks, and aftercare of cholecystectomy.  Risks include but are not limited to bleeding, infection, wound problems, anesthesia, diarrhea, bile leak, injury to common bile duct/liver/intestine.  She seems to understand and agrees to proceed.   FEN - CLD/NPO p MN VTE - lovenox ID - Rocephin Admit - obs  Henreitta Cea, [facility]  Surgery 07/31/2020, 4:07 PM Please see Amion for pager number during day hours 7:00am-4:30pm or 7:00am -11:30am on weekends

## 2020-07-31 NOTE — ED Provider Notes (Signed)
[facility] EMERGENCY DEPARTMENT Provider Note   CSN: [id number] Arrival date & time: 07/31/20  1028     History Chief Complaint  Patient presents with  . Abdominal Pain  . Chest Pain    Theresa Underwood is a 80 y.o. female.  Patient with history of hysterectomy, no other abdominal surgeries --presents the emergency department from urgent care today for evaluation of epigastric pain.  Patient states that she felt well yesterday.  She woke up around 7 AM today with significant epigastric pain.  She has had an episode of nausea and vomiting since that time.  She states that the pain "hurts" and cannot describe it more.  She denies diarrhea or urinary symptoms.  No lower abdominal pain.  Pain radiates from the epigastrium up into her chest.  She denies cough or shortness of breath.  No recent fevers however she does have shaking chills on arrival to the emergency department.  No known sick contacts.        Past Medical History:  Diagnosis Date  . Anemia   . Arthritis   . Bipolar disorder ([place])   . Burning pain    where bladder pushes thru vaginal wall  . Cholelithiasis   . Colon polyps   . Complication of anesthesia    slight trouble turning head side to side  . Contusion of right great toe with damage to nail    right toenail black for months  . Cystocele   . DDD (degenerative disc disease)   . Depression   . Diverticulosis   . Ganglion cyst of right foot    due to bone spurs  . GERD (gastroesophageal reflux disease)   . Glucose intolerance (impaired glucose tolerance)   . Hemorrhoids   . History of bladder infections   . History of TMJ disorder    no problems in years, wears bite guard when tmj occurs  . HOH (hard of hearing)    left more than right, mild  . Hyperlipidemia   . Hypertension   . Hypothyroidism   . Peripheral neuropathy    bilateral feet, Dr. Posey Pronto  . Prolapsed bladder   . Spinal stenosis   . Tinnitus    left ear drum beat     Patient Active Problem List   Diagnosis Date Noted  . IBS (irritable bowel syndrome) 02/01/2016  . Generalized abdominal pain 02/01/2016  . Nausea without vomiting 02/01/2016  . Cervical radiculopathy 10/25/2013  . Hypothyroidism   . History of TMJ disorder   . Depression   . DDD (degenerative disc disease)   . Arthritis   . Colon polyps   . Anemia   . Spinal stenosis   . Hyperlipidemia   . Bipolar disorder ([place])   . Peripheral neuropathy   . Glucose intolerance (impaired glucose tolerance)   . Hypertension   . Diverticulosis   . Cystocele     Past Surgical History:  Procedure Laterality Date  . ABDOMINAL HYSTERECTOMY  2002   total  . CYSTOCELE REPAIR N/A 12/05/2015   Procedure: CYSTOSCOPY, ANTERIOR REPAIR (CYSTOCELE), VAULT PROLAPSE AND GRAFT;  Surgeon: Bjorn Loser, MD;  Location: [facility];  Service: Urology;  Laterality: N/A;  . TONSILLECTOMY AND ADENOIDECTOMY     as a child  . TOTAL KNEE ARTHROPLASTY     bi-lateral 2000,2007  . TUBAL LIGATION  1976  . VAGINAL PROLAPSE REPAIR N/A 12/05/2015   Procedure: VAGINAL VAULT SUSPENSION;  Surgeon: Bjorn Loser, MD;  Location: [facility];  Service: Urology;  Laterality: N/A;     OB History    Gravida  2   Para  2   Term  2   Preterm      AB      Living  2     SAB      IAB      Ectopic      Multiple      Live Births              Family History  Problem Relation Age of Onset  . Bipolar disorder Mother   . Osteoporosis Mother   . Colon polyps Mother   . Stroke Mother        Deceased, 10  . Neuropathy Mother   . Peripheral vascular disease Maternal Grandmother   . Pancreatic cancer Maternal Grandmother   . Colon polyps Father   . Lung disease Father        Deceased, 72  . Diverticulitis Sister   . Neuropathy Maternal Grandfather   . Colon cancer Neg Hx   . Breast cancer Neg Hx     Social History   Tobacco Use  . Smoking status: Never Smoker  . Smokeless tobacco: Never Used  Vaping  Use  . Vaping Use: Never used  Substance Use Topics  . Alcohol use: Yes    Alcohol/week: 0.0 standard drinks    Comment: occ.  . Drug use: No    Home Medications Prior to Admission medications   Medication Sig Start Date End Date Taking? Authorizing Provider  acetaminophen (TYLENOL) 500 MG tablet Take 500 mg by mouth as needed.    [provider]  clobetasol cream (TEMOVATE) 4.40 % Apply 1 application topically as needed.    [provider]  clonazePAM (KLONOPIN) 0.5 MG tablet Take 0.5 mg by mouth at bedtime.    [provider]  gabapentin (NEURONTIN) 600 MG tablet TAKE 1 TABLET(600 MG) BY MOUTH THREE TIMES DAILY 04/27/20   Narda Amber K, DO  HYDROcodone-acetaminophen (NORCO) 5-325 MG tablet Take 1-2 tablets by mouth every 6 (six) hours as needed for moderate pain. 12/05/15   Debbrah Alar, PA-C  hydrocortisone (ANUSOL-HC) 2.5 % rectal cream Place 1 application rectally 3 (three) times daily. 07/14/20   Esterwood, Amy S, PA-C  hyoscyamine (LEVSIN SL) 0.125 MG SL tablet Place 1 tablet (0.125 mg total) under the tongue every 6 (six) hours as needed. 07/14/20   Esterwood, Amy S, PA-C  lamoTRIgine (LAMICTAL) 200 MG tablet Take 200 mg by mouth daily.    [provider]  levothyroxine (SYNTHROID, LEVOTHROID) 112 MCG tablet Take 112 mcg by mouth at bedtime. Brand name only    [provider]  lisinopril (PRINIVIL,ZESTRIL) 10 MG tablet Take 10 mg by mouth 3 (three) times a week. Take on Monday, Wednesday, Friday    [provider]  mupirocin ointment (BACTROBAN) 2 % apply to affected area three times a day for 10 days [place] 10/20/15   [provider]    Allergies    Sulfa antibiotics, Carbamazepine, and Sulfonamide derivatives  Review of Systems   Review of Systems  Constitutional: Positive for chills. Negative for fever.  HENT: Negative for rhinorrhea and sore throat.   Eyes: Negative for redness.  Respiratory:  Negative for cough.   Cardiovascular: Positive for chest pain.  Gastrointestinal: Positive for abdominal pain, nausea and vomiting. Negative for diarrhea.  Genitourinary: Negative for dysuria, frequency, hematuria and urgency.  Musculoskeletal: Negative for  myalgias.  Skin: Negative for rash.  Neurological: Negative for headaches.    Physical Exam Updated Vital Signs BP (!) 91/53 (BP Location: Left Arm)   Pulse 82   Temp 98.3 F (36.8 C)   Resp 15   Ht 5\' 5"  (1.651 m)   Wt 68 kg   SpO2 95%   BMI 24.96 kg/m   Physical Exam Vitals and nursing note reviewed.  Constitutional:      General: She is not in acute distress.    Appearance: She is well-developed.     Comments: Shaking chills on exam.   HENT:     Head: Normocephalic and atraumatic.     Right Ear: External ear normal.     Left Ear: External ear normal.     Nose: Nose normal.  Eyes:     Conjunctiva/sclera: Conjunctivae normal.  Cardiovascular:     Rate and Rhythm: Normal rate and regular rhythm.     Heart sounds: Murmur heard.      Comments: II/VI systolic murmur heard at left lower sternal border Pulmonary:     Effort: No respiratory distress.     Breath sounds: No wheezing, rhonchi or rales.  Abdominal:     Palpations: Abdomen is soft.     Tenderness: There is abdominal tenderness (mild, epigastic) in the epigastric area. There is no guarding or rebound.  Musculoskeletal:     Cervical back: Normal range of motion and neck supple.     Right lower leg: No edema.     Left lower leg: No edema.  Skin:    General: Skin is warm and dry.     Findings: No rash.  Neurological:     General: No focal deficit present.     Mental Status: She is alert. Mental status is at baseline.     Motor: No weakness.  Psychiatric:        Mood and Affect: Mood normal.     ED Results / Procedures / Treatments   Labs (all labs ordered are listed, but only abnormal results are displayed) Labs Reviewed  CBC WITH  DIFFERENTIAL/PLATELET - Abnormal; Notable for the following components:      Result Value   RBC 3.48 (*)    Hemoglobin 10.6 (*)    HCT 34.1 (*)    Platelets 434 (*)    Neutro Abs 7.9 (*)    All other components within normal limits  COMPREHENSIVE METABOLIC PANEL - Abnormal; Notable for the following components:   CO2 21 (*)    Glucose, Bld 158 (*)    Calcium 8.7 (*)    AST 263 (*)    ALT 137 (*)    Total Bilirubin 1.4 (*)    GFR, Estimated 58 (*)    All other components within normal limits  LIPASE, BLOOD - Abnormal; Notable for the following components:   Lipase 100 (*)    All other components within normal limits  URINALYSIS, ROUTINE W REFLEX MICROSCOPIC - Abnormal; Notable for the following components:   APPearance CLOUDY (*)    Leukocytes,Ua MODERATE (*)    Bacteria, UA MANY (*)    All other components within normal limits  LACTIC ACID, PLASMA - Abnormal; Notable for the following components:   Lactic Acid, Venous 2.4 (*)    All other components within normal limits  LACTIC ACID, PLASMA - Abnormal; Notable for the following components:   Lactic Acid, Venous 2.6 (*)    All other components within normal limits  RESP PANEL  BY RT-PCR (FLU A&B, COVID) ARPGX2  CULTURE, BLOOD (ROUTINE X 2)  CULTURE, BLOOD (ROUTINE X 2)  URINE CULTURE  TROPONIN I (HIGH SENSITIVITY)  TROPONIN I (HIGH SENSITIVITY)    EKG EKG Interpretation  Date/Time:  Monday July 31 2020 14:06:30 EST Ventricular Rate:  96 PR Interval:    QRS Duration: 100 QT Interval:  330 QTC Calculation: 417 R Axis:   86 Text Interpretation: Sinus rhythm Inferior infarct, age indeterminate Since last tracing Rate faster Confirmed by Calvert Cantor [phone]) on 07/31/2020 2:40:20 PM   Radiology DG Chest 2 View  Result Date: 07/31/2020 CLINICAL DATA:  Epigastric pain, vomiting EXAM: CHEST - 2 VIEW COMPARISON:  09/23/2005 FINDINGS: Heart and mediastinal contours are within normal limits. No focal opacities or  effusions. No acute bony abnormality. IMPRESSION: No active cardiopulmonary disease. Electronically Signed   By: Rolm Baptise M.D.   On: 07/31/2020 11:50    Procedures Procedures   Medications Ordered in ED Medications  metroNIDAZOLE (FLAGYL) IVPB 500 mg (500 mg Intravenous New Bag/Given 07/31/20 1515)  acetaminophen (TYLENOL) tablet 650 mg (650 mg Oral Given 07/31/20 1250)  cefTRIAXone (ROCEPHIN) 1 g in sodium chloride 0.9 % 100 mL IVPB (0 g Intravenous Stopped 07/31/20 1359)  iohexol (OMNIPAQUE) 350 MG/ML injection 100 mL (100 mLs Intravenous Contrast Given 07/31/20 1431)  sodium chloride 0.9 % bolus 1,000 mL (1,000 mLs Intravenous New Bag/Given 07/31/20 1514)    ED Course  I have reviewed the triage vital signs and the nursing notes.  Pertinent labs & imaging results that were available during my care of the patient were reviewed by me and considered in my medical decision making (see chart for details).   Patient seen and examined.  Patient tremulous on arrival but without fever.  She has mild epigastric abdominal pain.  EKG from urgent care was nonischemic.  Chest and abdominal pain labs ordered as well as chest x-ray.  Will consider CT imaging.  Vital signs reviewed and are as follows: BP (!) 91/53 (BP Location: Left Arm)   Pulse 82   Temp 98.3 F (36.8 C)   Resp 15   Ht 5\' 5"  (1.651 m)   Wt 68 kg   SpO2 95%   BMI 24.96 kg/m   12:20 PM Rectal temp 101.4. Lactic 2.4. Blood cultures, COVID ordered. CXR reviewed, clear.    12:38 PM UA is not a clean-catch, however questionable for UTI.  Will start Rocephin.  3:22 PM CT personally reviewed.  Concern for cholecystitis versus cholangitis.  There is common bile duct dilation but no definite stone.  Discussed with Saverio Danker PA-C of general surgery who will see.  Request hospitalist admission.   3:43 PM Spoke with Dr. Roosevelt Locks of [facility] who will see.   Clinical Course as of 07/31/20 1522  Mon Jul 31, 2020  1433 CT ABDOMEN PELVIS W  CONTRAST [KC]    Clinical Course User Index [KC] Corless, Fredrik Rigger, Student-PA   MDM Rules/Calculators/A&P                          Patient with sepsis, RUQ pain. Admit for IV abx, continued evaluation.    Final Clinical Impression(s) / ED Diagnoses Final diagnoses:  Sepsis without acute organ dysfunction, due to unspecified organism [facility])  RUQ pain    Rx / DC Orders ED Discharge Orders    None       Carlisle Cater, PA-C 07/31/20 Miller, [place],  MD 08/01/20 3300

## 2020-08-01 ENCOUNTER — Observation Stay (HOSPITAL_COMMUNITY): Payer: PPO | Admitting: Anesthesiology

## 2020-08-01 ENCOUNTER — Encounter (HOSPITAL_COMMUNITY): Payer: Self-pay

## 2020-08-01 ENCOUNTER — Encounter (HOSPITAL_COMMUNITY): Admission: EM | Disposition: A | Discharge status: Home / Self Care | Payer: Self-pay | Source: Ambulatory Visit

## 2020-08-01 ENCOUNTER — Observation Stay (HOSPITAL_COMMUNITY): Payer: PPO

## 2020-08-01 DIAGNOSIS — Z8262 Family history of osteoporosis: Secondary | ICD-10-CM | POA: Diagnosis not present

## 2020-08-01 DIAGNOSIS — K219 Gastro-esophageal reflux disease without esophagitis: Secondary | ICD-10-CM | POA: Diagnosis present

## 2020-08-01 DIAGNOSIS — K8051 Calculus of bile duct without cholangitis or cholecystitis with obstruction: Secondary | ICD-10-CM | POA: Diagnosis not present

## 2020-08-01 DIAGNOSIS — B961 Klebsiella pneumoniae [K. pneumoniae] as the cause of diseases classified elsewhere: Secondary | ICD-10-CM | POA: Diagnosis present

## 2020-08-01 DIAGNOSIS — B9620 Unspecified Escherichia coli [E. coli] as the cause of diseases classified elsewhere: Secondary | ICD-10-CM | POA: Diagnosis not present

## 2020-08-01 DIAGNOSIS — N390 Urinary tract infection, site not specified: Secondary | ICD-10-CM | POA: Diagnosis not present

## 2020-08-01 DIAGNOSIS — F319 Bipolar disorder, unspecified: Secondary | ICD-10-CM | POA: Diagnosis not present

## 2020-08-01 DIAGNOSIS — K8020 Calculus of gallbladder without cholecystitis without obstruction: Secondary | ICD-10-CM | POA: Diagnosis present

## 2020-08-01 DIAGNOSIS — K838 Other specified diseases of biliary tract: Secondary | ICD-10-CM | POA: Diagnosis not present

## 2020-08-01 DIAGNOSIS — Z8249 Family history of ischemic heart disease and other diseases of the circulatory system: Secondary | ICD-10-CM | POA: Diagnosis not present

## 2020-08-01 DIAGNOSIS — Z882 Allergy status to sulfonamides status: Secondary | ICD-10-CM | POA: Diagnosis not present

## 2020-08-01 DIAGNOSIS — R7989 Other specified abnormal findings of blood chemistry: Secondary | ICD-10-CM | POA: Diagnosis not present

## 2020-08-01 DIAGNOSIS — I10 Essential (primary) hypertension: Secondary | ICD-10-CM | POA: Diagnosis not present

## 2020-08-01 DIAGNOSIS — R8271 Bacteriuria: Secondary | ICD-10-CM | POA: Diagnosis not present

## 2020-08-01 DIAGNOSIS — G629 Polyneuropathy, unspecified: Secondary | ICD-10-CM | POA: Diagnosis not present

## 2020-08-01 DIAGNOSIS — Z8371 Family history of colonic polyps: Secondary | ICD-10-CM | POA: Diagnosis not present

## 2020-08-01 DIAGNOSIS — Z96653 Presence of artificial knee joint, bilateral: Secondary | ICD-10-CM | POA: Diagnosis present

## 2020-08-01 DIAGNOSIS — D649 Anemia, unspecified: Secondary | ICD-10-CM | POA: Diagnosis present

## 2020-08-01 DIAGNOSIS — K8065 Calculus of gallbladder and bile duct with chronic cholecystitis with obstruction: Secondary | ICD-10-CM | POA: Diagnosis not present

## 2020-08-01 DIAGNOSIS — E871 Hypo-osmolality and hyponatremia: Secondary | ICD-10-CM | POA: Diagnosis not present

## 2020-08-01 DIAGNOSIS — M5412 Radiculopathy, cervical region: Secondary | ICD-10-CM | POA: Diagnosis present

## 2020-08-01 DIAGNOSIS — R932 Abnormal findings on diagnostic imaging of liver and biliary tract: Secondary | ICD-10-CM | POA: Diagnosis not present

## 2020-08-01 DIAGNOSIS — E785 Hyperlipidemia, unspecified: Secondary | ICD-10-CM | POA: Diagnosis present

## 2020-08-01 DIAGNOSIS — R7881 Bacteremia: Secondary | ICD-10-CM | POA: Diagnosis present

## 2020-08-01 DIAGNOSIS — E039 Hypothyroidism, unspecified: Secondary | ICD-10-CM | POA: Diagnosis not present

## 2020-08-01 DIAGNOSIS — K819 Cholecystitis, unspecified: Secondary | ICD-10-CM | POA: Diagnosis not present

## 2020-08-01 DIAGNOSIS — R739 Hyperglycemia, unspecified: Secondary | ICD-10-CM | POA: Diagnosis not present

## 2020-08-01 DIAGNOSIS — K8043 Calculus of bile duct with acute cholecystitis with obstruction: Secondary | ICD-10-CM | POA: Diagnosis not present

## 2020-08-01 DIAGNOSIS — K589 Irritable bowel syndrome without diarrhea: Secondary | ICD-10-CM | POA: Diagnosis present

## 2020-08-01 DIAGNOSIS — K8010 Calculus of gallbladder with chronic cholecystitis without obstruction: Secondary | ICD-10-CM | POA: Diagnosis not present

## 2020-08-01 DIAGNOSIS — Z800 Family history of malignant neoplasm of digestive organs: Secondary | ICD-10-CM | POA: Diagnosis not present

## 2020-08-01 DIAGNOSIS — Z20822 Contact with and (suspected) exposure to covid-19: Secondary | ICD-10-CM | POA: Diagnosis not present

## 2020-08-01 DIAGNOSIS — Z823 Family history of stroke: Secondary | ICD-10-CM | POA: Diagnosis not present

## 2020-08-01 HISTORY — PX: CHOLECYSTECTOMY: SHX55

## 2020-08-01 LAB — COMPREHENSIVE METABOLIC PANEL
ALT: 330 U/L — ABNORMAL HIGH (ref 0–44)
AST: 375 U/L — ABNORMAL HIGH (ref 15–41)
Albumin: 3.1 g/dL — ABNORMAL LOW (ref 3.5–5.0)
Alkaline Phosphatase: 145 U/L — ABNORMAL HIGH (ref 38–126)
Anion gap: 8 (ref 5–15)
BUN: 13 mg/dL (ref 8–23)
CO2: 23 mmol/L (ref 22–32)
Calcium: 8.1 mg/dL — ABNORMAL LOW (ref 8.9–10.3)
Chloride: 103 mmol/L (ref 98–111)
Creatinine, Ser: 0.94 mg/dL (ref 0.44–1.00)
GFR, Estimated: >60 mL/min (ref >60)
Glucose, Bld: 182 mg/dL — ABNORMAL HIGH (ref 70–99)
Potassium: 3.8 mmol/L (ref 3.5–5.1)
Sodium: 134 mmol/L — ABNORMAL LOW (ref 135–145)
Total Bilirubin: 2.6 mg/dL — ABNORMAL HIGH (ref 0.3–1.2)
Total Protein: 6 g/dL — ABNORMAL LOW (ref 6.5–8.1)

## 2020-08-01 LAB — BLOOD CULTURE ID PANEL (REFLEXED) - BCID2

## 2020-08-01 LAB — CBC
HCT: 27.3 % — ABNORMAL LOW (ref 36.0–46.0)
Hemoglobin: 9.2 g/dL — ABNORMAL LOW (ref 12.0–15.0)
MCH: 31.9 pg (ref 26.0–34.0)
MCHC: 33.7 g/dL (ref 30.0–36.0)
MCV: 94.8 fL (ref 80.0–100.0)
Platelets: 334 10*3/uL (ref 150–400)
RBC: 2.88 MIL/uL — ABNORMAL LOW (ref 3.87–5.11)
RDW: 12.9 % (ref 11.5–15.5)
WBC: 12.7 10*3/uL — ABNORMAL HIGH (ref 4.0–10.5)
nRBC: 0 % (ref 0.0–0.2)

## 2020-08-01 SURGERY — LAPAROSCOPIC CHOLECYSTECTOMY WITH INTRAOPERATIVE CHOLANGIOGRAM
Anesthesia: General | Site: Abdomen

## 2020-08-01 MED ORDER — LACTATED RINGERS IV SOLN: INTRAVENOUS | Status: DC

## 2020-08-01 MED ORDER — LIDOCAINE 2% (20 MG/ML) 5 ML SYRINGE
INTRAMUSCULAR | Status: DC | PRN
  Administered 2020-08-01: 80 mg via INTRAVENOUS

## 2020-08-01 MED ORDER — AMISULPRIDE (ANTIEMETIC) 5 MG/2ML IV SOLN
10.0000 mg | Freq: Once | INTRAVENOUS | Status: AC | PRN
  Administered 2020-08-01: 10 mg via INTRAVENOUS

## 2020-08-01 MED ORDER — ACETAMINOPHEN 500 MG PO TABS
1000.0000 mg | ORAL_TABLET | Freq: Once | ORAL | Status: AC
  Administered 2020-08-01: 1000 mg via ORAL
  Filled 2020-08-01: qty 2

## 2020-08-01 MED ORDER — DEXAMETHASONE SODIUM PHOSPHATE 10 MG/ML IJ SOLN
INTRAMUSCULAR | Status: DC | PRN
  Administered 2020-08-01: 10 mg via INTRAVENOUS

## 2020-08-01 MED ORDER — PHENYLEPHRINE HCL-NACL 10-0.9 MG/250ML-% IV SOLN
INTRAVENOUS | Status: DC | PRN
  Administered 2020-08-01: 40 ug/min via INTRAVENOUS

## 2020-08-01 MED ORDER — FENTANYL CITRATE (PF) 100 MCG/2ML IJ SOLN
25.0000 ug | INTRAMUSCULAR | Status: DC | PRN
  Administered 2020-08-01: 50 ug via INTRAVENOUS

## 2020-08-01 MED ORDER — ENOXAPARIN SODIUM 40 MG/0.4ML ~~LOC~~ SOLN: 40.0000 mg | SUBCUTANEOUS | Status: DC

## 2020-08-01 MED ORDER — FENTANYL CITRATE (PF) 100 MCG/2ML IJ SOLN
INTRAMUSCULAR | Status: AC
  Filled 2020-08-01: qty 2

## 2020-08-01 MED ORDER — PROMETHAZINE HCL 25 MG/ML IJ SOLN
6.2500 mg | INTRAMUSCULAR | Status: DC | PRN
Start: 2020-08-01 — End: 2020-08-01

## 2020-08-01 MED ORDER — ROCURONIUM BROMIDE 10 MG/ML (PF) SYRINGE
PREFILLED_SYRINGE | INTRAVENOUS | Status: DC | PRN
  Administered 2020-08-01: 60 mg via INTRAVENOUS

## 2020-08-01 MED ORDER — SODIUM CHLORIDE 0.9 % IR SOLN
Status: DC | PRN
  Administered 2020-08-01: 1000 mL

## 2020-08-01 MED ORDER — OXYCODONE HCL 5 MG PO TABS: 5.0000 mg | ORAL_TABLET | Freq: Once | ORAL | Status: DC | PRN

## 2020-08-01 MED ORDER — FENTANYL CITRATE (PF) 250 MCG/5ML IJ SOLN
INTRAMUSCULAR | Status: DC | PRN
  Administered 2020-08-01 (×3): 50 ug via INTRAVENOUS

## 2020-08-01 MED ORDER — ONDANSETRON HCL 4 MG/2ML IJ SOLN
INTRAMUSCULAR | Status: DC | PRN
  Administered 2020-08-01: 4 mg via INTRAVENOUS

## 2020-08-01 MED ORDER — 0.9 % SODIUM CHLORIDE (POUR BTL) OPTIME
TOPICAL | Status: DC | PRN
  Administered 2020-08-01: 1000 mL

## 2020-08-01 MED ORDER — PROPOFOL 10 MG/ML IV BOLUS
INTRAVENOUS | Status: AC
  Filled 2020-08-01: qty 20

## 2020-08-01 MED ORDER — FENTANYL CITRATE (PF) 250 MCG/5ML IJ SOLN
INTRAMUSCULAR | Status: AC
  Filled 2020-08-01: qty 5

## 2020-08-01 MED ORDER — LIDOCAINE 2% (20 MG/ML) 5 ML SYRINGE
INTRAMUSCULAR | Status: AC
  Filled 2020-08-01: qty 5

## 2020-08-01 MED ORDER — AMISULPRIDE (ANTIEMETIC) 5 MG/2ML IV SOLN
INTRAVENOUS | Status: AC
  Filled 2020-08-01: qty 2

## 2020-08-01 MED ORDER — STERILE WATER FOR IRRIGATION IR SOLN
Status: DC | PRN
  Administered 2020-08-01: 1000 mL

## 2020-08-01 MED ORDER — MIDAZOLAM HCL 2 MG/2ML IJ SOLN
INTRAMUSCULAR | Status: AC
  Filled 2020-08-01: qty 2

## 2020-08-01 MED ORDER — CHLORHEXIDINE GLUCONATE 0.12 % MT SOLN
OROMUCOSAL | Status: AC
  Administered 2020-08-01: 15 mL
  Filled 2020-08-01: qty 15

## 2020-08-01 MED ORDER — BUPIVACAINE HCL (PF) 0.25 % IJ SOLN
INTRAMUSCULAR | Status: AC
  Filled 2020-08-01: qty 30

## 2020-08-01 MED ORDER — SUGAMMADEX SODIUM 200 MG/2ML IV SOLN
INTRAVENOUS | Status: DC | PRN
  Administered 2020-08-01: 200 mg via INTRAVENOUS

## 2020-08-01 MED ORDER — OXYCODONE HCL 5 MG/5ML PO SOLN
5.0000 mg | Freq: Once | ORAL | Status: DC | PRN
Start: 2020-08-01 — End: 2020-08-01

## 2020-08-01 MED ORDER — ACETAMINOPHEN 500 MG PO TABS
1000.0000 mg | ORAL_TABLET | Freq: Four times a day (QID) | ORAL | Status: DC
  Administered 2020-08-01 – 2020-08-04 (×11): 1000 mg via ORAL
  Filled 2020-08-01 (×11): qty 2

## 2020-08-01 MED ORDER — EPHEDRINE SULFATE-NACL 50-0.9 MG/10ML-% IV SOSY
PREFILLED_SYRINGE | INTRAVENOUS | Status: DC | PRN
  Administered 2020-08-01: 10 mg via INTRAVENOUS

## 2020-08-01 MED ORDER — PHENYLEPHRINE 40 MCG/ML (10ML) SYRINGE FOR IV PUSH (FOR BLOOD PRESSURE SUPPORT)
PREFILLED_SYRINGE | INTRAVENOUS | Status: AC
  Filled 2020-08-01: qty 10

## 2020-08-01 MED ORDER — PROPOFOL 10 MG/ML IV BOLUS
INTRAVENOUS | Status: DC | PRN
  Administered 2020-08-01: 120 mg via INTRAVENOUS

## 2020-08-01 MED ORDER — CHLORHEXIDINE GLUCONATE 0.12 % MT SOLN: 15.0000 mL | Freq: Once | OROMUCOSAL | Status: AC

## 2020-08-01 MED ORDER — DEXAMETHASONE SODIUM PHOSPHATE 10 MG/ML IJ SOLN
INTRAMUSCULAR | Status: AC
  Filled 2020-08-01: qty 1

## 2020-08-01 MED ORDER — ONDANSETRON HCL 4 MG/2ML IJ SOLN
INTRAMUSCULAR | Status: AC
  Filled 2020-08-01: qty 2

## 2020-08-01 MED ORDER — ORAL CARE MOUTH RINSE: 15.0000 mL | Freq: Once | OROMUCOSAL | Status: AC

## 2020-08-01 MED ORDER — EPHEDRINE 5 MG/ML INJ
INTRAVENOUS | Status: AC
  Filled 2020-08-01: qty 10

## 2020-08-01 MED ORDER — BUPIVACAINE HCL 0.25 % IJ SOLN
INTRAMUSCULAR | Status: DC | PRN
  Administered 2020-08-01: 8 mL

## 2020-08-01 MED ORDER — SODIUM CHLORIDE 0.9 % IV SOLN
INTRAVENOUS | Status: DC | PRN
  Administered 2020-08-01: 30 mL

## 2020-08-01 SURGICAL SUPPLY — 47 items
ADH SKN CLS APL DERMABOND .7 (GAUZE/BANDAGES/DRESSINGS) ×1
APL PRP STRL LF DISP 70% ISPRP (MISCELLANEOUS) ×1
APPLIER CLIP 5 13 M/L LIGAMAX5 (MISCELLANEOUS) ×2
APR CLP MED LRG 5 ANG JAW (MISCELLANEOUS) ×1
BAG SPEC RTRVL 10 TROC 200 (ENDOMECHANICALS)
CANISTER SUCT 3000ML PPV (MISCELLANEOUS) ×2 IMPLANT
CHLORAPREP W/TINT 26 (MISCELLANEOUS) ×2 IMPLANT
CLIP APPLIE 5 13 M/L LIGAMAX5 (MISCELLANEOUS) IMPLANT
CLIP VESOLOCK MED LG 6/CT (CLIP) ×2 IMPLANT
COVER MAYO STAND STRL (DRAPES) ×2 IMPLANT
COVER SURGICAL LIGHT HANDLE (MISCELLANEOUS) ×2 IMPLANT
COVER TRANSDUCER ULTRASND (DRAPES) ×2 IMPLANT
DERMABOND ADVANCED (GAUZE/BANDAGES/DRESSINGS) ×1
DERMABOND ADVANCED .7 DNX12 (GAUZE/BANDAGES/DRESSINGS) ×1 IMPLANT
DRAPE C-ARM 42X120 X-RAY (DRAPES) ×2 IMPLANT
ELECT REM PT RETURN 9FT ADLT (ELECTROSURGICAL) ×2
ELECTRODE REM PT RTRN 9FT ADLT (ELECTROSURGICAL) ×1 IMPLANT
GOWN STRL REUS W/ TWL LRG LVL3 (GOWN DISPOSABLE) ×2 IMPLANT
GOWN STRL REUS W/ TWL XL LVL3 (GOWN DISPOSABLE) ×1 IMPLANT
GOWN STRL REUS W/TWL LRG LVL3 (GOWN DISPOSABLE) ×4
GOWN STRL REUS W/TWL XL LVL3 (GOWN DISPOSABLE) ×2
GRASPER SUT TROCAR 14GX15 (MISCELLANEOUS) ×2 IMPLANT
IV CATH 14GX2 1/4 (CATHETERS) ×2 IMPLANT
KIT BASIN OR (CUSTOM PROCEDURE TRAY) ×2 IMPLANT
KIT TURNOVER KIT B (KITS) ×2 IMPLANT
NDL INSUFFLATION 14GA 120MM (NEEDLE) ×1 IMPLANT
NEEDLE INSUFFLATION 14GA 120MM (NEEDLE) ×2 IMPLANT
NS IRRIG 1000ML POUR BTL (IV SOLUTION) ×2 IMPLANT
PAD ARMBOARD 7.5X6 YLW CONV (MISCELLANEOUS) ×4 IMPLANT
POUCH LAPAROSCOPIC INSTRUMENT (MISCELLANEOUS) ×2 IMPLANT
POUCH RETRIEVAL ECOSAC 10 (ENDOMECHANICALS) IMPLANT
POUCH RETRIEVAL ECOSAC 10MM (ENDOMECHANICALS)
SCISSORS LAP 5X35 DISP (ENDOMECHANICALS) ×2 IMPLANT
SET CHOLANGIOGRAPHY FRANKLIN (SET/KITS/TRAYS/PACK) ×2 IMPLANT
SET IRRIG TUBING LAPAROSCOPIC (IRRIGATION / IRRIGATOR) ×2 IMPLANT
SET TUBE SMOKE EVAC HIGH FLOW (TUBING) ×2 IMPLANT
SLEEVE ENDOPATH XCEL 5M (ENDOMECHANICALS) ×3 IMPLANT
SPECIMEN JAR SMALL (MISCELLANEOUS) ×2 IMPLANT
STOPCOCK 4 WAY LG BORE MALE ST (IV SETS) ×2 IMPLANT
SUT MNCRL AB 4-0 PS2 18 (SUTURE) ×2 IMPLANT
TOWEL GREEN STERILE (TOWEL DISPOSABLE) ×2 IMPLANT
TOWEL GREEN STERILE FF (TOWEL DISPOSABLE) ×2 IMPLANT
TRAY LAPAROSCOPIC MC (CUSTOM PROCEDURE TRAY) ×2 IMPLANT
TROCAR XCEL NON-BLD 11X100MML (ENDOMECHANICALS) ×2 IMPLANT
TROCAR XCEL NON-BLD 5MMX100MML (ENDOMECHANICALS) ×2 IMPLANT
WARMER LAPAROSCOPE (MISCELLANEOUS) ×2 IMPLANT
WATER STERILE IRR 1000ML POUR (IV SOLUTION) ×2 IMPLANT

## 2020-08-01 NOTE — Transfer of Care (Signed)
Immediate Anesthesia Transfer of Care Note  Patient: Theresa Underwood  Procedure(s) Performed: LAPAROSCOPIC CHOLECYSTECTOMY WITH INTRAOPERATIVE CHOLANGIOGRAM (N/A Abdomen)  Patient Location: PACU  Anesthesia Type:General  Level of Consciousness: awake, alert  and oriented  Airway & Oxygen Therapy: Patient Spontanous Breathing  Post-op Assessment: Report given to RN, Post -op Vital signs reviewed and stable and Patient moving all extremities  Post vital signs: Reviewed and stable  Last Vitals:  Vitals Value Taken Time  BP 146/67 08/01/20 1217  Temp    Pulse 83 08/01/20 1219  Resp 15 08/01/20 1219  SpO2 98 % 08/01/20 1219  Vitals shown include unvalidated device data.  Last Pain:  Vitals:   08/01/20 0100  TempSrc:   PainSc: 0-No pain         Complications: No complications documented.

## 2020-08-01 NOTE — Progress Notes (Signed)
Assisted to bed from chair. Standby assist. Alert/oriented and independent. Ambulates to and from bathroom with no difficulty. Denies pain and nausea at this time. IVF started per [facility] orders. She brought her home medication, Synthroid/trademark brand . Counted and sent to pharmacy for dispensing. She is informed that she will get the remaining medication back at discharge. Call light in reach. Continuing to monitor.

## 2020-08-01 NOTE — Anesthesia Procedure Notes (Addendum)
Procedure Name: Intubation Date/Time: 08/01/2020 10:57 AM Performed by: Babs Bertin, CRNA Pre-anesthesia Checklist: Patient identified, Emergency Drugs available, Suction available and Patient being monitored Patient Re-evaluated:Patient Re-evaluated prior to induction Oxygen Delivery Method: Circle system utilized Preoxygenation: Pre-oxygenation with 100% oxygen Induction Type: IV induction Ventilation: Mask ventilation without difficulty Laryngoscope Size: Mac and 3 Grade View: Grade I Tube type: Oral Tube size: 7.0 mm Number of attempts: 1 Airway Equipment and Method: Stylet Placement Confirmation: ETT inserted through vocal cords under direct vision,  positive ETCO2 and breath sounds checked- equal and bilateral Secured at: 22 cm Tube secured with: Tape Dental Injury: Teeth and Oropharynx as per pre-operative assessment  Comments: AOI by Lerry Liner, SRNA with Dr. Elgie Congo supervising.

## 2020-08-01 NOTE — Anesthesia Preprocedure Evaluation (Addendum)
Anesthesia Evaluation  Patient identified by MRN, date of birth, ID band Patient awake    Reviewed: Allergy & Precautions, NPO status , Patient's Chart, lab work & pertinent test results  Airway Mallampati: II  TM Distance: >3 FB Neck ROM: Full    Dental no notable dental hx.    Pulmonary neg pulmonary ROS,    Pulmonary exam normal breath sounds clear to auscultation       Cardiovascular Exercise Tolerance: Good hypertension, Normal cardiovascular exam Rhythm:Regular Rate:Normal     Neuro/Psych PSYCHIATRIC DISORDERS Depression Bipolar Disorder  Neuromuscular disease (cervical radiculopathy)    GI/Hepatic GERD  ,  Endo/Other  Hypothyroidism hyperlipidemia  Renal/GU   negative genitourinary   Musculoskeletal  (+) Arthritis ,   Abdominal (+)  Abdomen: soft and tender.    Peds negative pediatric ROS (+)  Hematology  (+) anemia ,   Anesthesia Other Findings TMJ dysfunction  Reproductive/Obstetrics                            Anesthesia Physical Anesthesia Plan  ASA: III  Anesthesia Plan: General   Post-op Pain Management:    Induction: Intravenous and Rapid sequence  PONV Risk Score and Plan: 3 and Midazolam, Ondansetron, Dexamethasone and Treatment may vary due to age or medical condition  Airway Management Planned: Oral ETT  Additional Equipment: None  Intra-op Plan:   Post-operative Plan: Extubation in OR  Informed Consent: I have reviewed the patients History and Physical, chart, labs and discussed the procedure including the risks, benefits and alternatives for the proposed anesthesia with the patient or authorized representative who has indicated his/her understanding and acceptance.   Patient has DNR.  Discussed DNR with patient and Suspend DNR.   Dental advisory given  Plan Discussed with: CRNA, Anesthesiologist and Surgeon  Anesthesia Plan Comments: (Patient's  states husband on the way to hospital with her DNR. Had a long discussion with patient regarding her options. She has chosen to temporarily suspend the DNR perioperatively and therefore will remain full code during the operation. Norton Blizzard, MD  )       Anesthesia Quick Evaluation

## 2020-08-01 NOTE — Consult Note (Addendum)
[facility] Consult: 12:52 PM 08/01/2020  LOS: 0 days    Referring Provider: Dr  Rosendo Gros Primary Care Physician:  Shon Baton, MD Primary Gastroenterologist:  Dr. Scarlette Shorts    Reason for Consultation: Choledocholithiasis on IOC.   HPI: Theresa Underwood is a 80 y.o. female.  Hx Degenerative spine and disc disease.  Peripheral neuropathy.  Anemia.  Uterine leiomyoma.  Chronic cervicitis and squamous metaplasia, 2002 total hysterectomy SPO.  Repair of vaginal prolapse. IBS 10/2007 colonoscopy with removal of hyperplastic polyp. 07/2011 EGD for anemia, dyspepsia.  Normal study.  Biopsies of small bowel normal, negative for sprue.   07/2011 colonoscopy for screening and anemia.  Other than moderate left-sided diverticulosis, study was normal and suggested follow-up colonoscopy 2023  Developed sudden onset epigastric pain early yesterday morning.  Had nausea and nonbloody emesis.  Burning pain radiated from the epigastrium to the L chest.  Never had abdominal or back pain. CTAP w contrast: Suspicious for acute cholecystitis.  CBD 1.1 cm but no definite CBD stone.  Bilateral pulmonary nodules.  Mild, nonspecific periportal edema.  Sigmoid diverticulosis. T bili 1.4 >> 2.6.  Alk phos 120 >> 45.  AST/ALT 236/137 >> 375/330. Hb 10.6 >> 9.2.  WBCs 12.7.  MCV in the 90s.  Normal platelets.  Normal renal function. Dr. Rosendo Gros proceeded with lap chole this morning and patient has multiple filling defects in CBD on the intraoperative cholangiogram.   Both of her parents had gallbladder disease and underwent cholecystectomy.   Past Medical History:  Diagnosis Date  . Anemia   . Arthritis   . Bipolar disorder ([place])   . Burning pain    where bladder pushes thru vaginal wall  . Cholelithiasis   . Colon polyps   .  Complication of anesthesia    slight trouble turning head side to side  . Contusion of right great toe with damage to nail    right toenail black for months  . Cystocele   . DDD (degenerative disc disease)   . Depression   . Diverticulosis   . Ganglion cyst of right foot    due to bone spurs  . GERD (gastroesophageal reflux disease)   . Glucose intolerance (impaired glucose tolerance)   . Hemorrhoids   . History of bladder infections   . History of TMJ disorder    no problems in years, wears bite guard when tmj occurs  . HOH (hard of hearing)    left more than right, mild  . Hyperlipidemia   . Hypertension   . Hypothyroidism   . Peripheral neuropathy    bilateral feet, Dr. Posey Pronto  . Prolapsed bladder   . Spinal stenosis   . Tinnitus    left ear drum beat    Past Surgical History:  Procedure Laterality Date  . ABDOMINAL HYSTERECTOMY  2002   total  . CYSTOCELE REPAIR N/A 12/05/2015   Procedure: CYSTOSCOPY, ANTERIOR REPAIR (CYSTOCELE), VAULT PROLAPSE AND GRAFT;  Surgeon: Bjorn Loser, MD;  Location: [facility];  Service: Urology;  Laterality: N/A;  . TONSILLECTOMY AND  ADENOIDECTOMY     as a child  . TOTAL KNEE ARTHROPLASTY     bi-lateral 2000,2007  . TUBAL LIGATION  1976  . VAGINAL PROLAPSE REPAIR N/A 12/05/2015   Procedure: VAGINAL VAULT SUSPENSION;  Surgeon: Bjorn Loser, MD;  Location: [facility];  Service: Urology;  Laterality: N/A;    Prior to Admission medications   Medication Sig Start Date End Date Taking? Authorizing Provider  acetaminophen (TYLENOL) 500 MG tablet Take 500 mg by mouth every 6 (six) hours as needed for mild pain.   Yes [provider]  clobetasol cream (TEMOVATE) 4.80 % Apply 1 application topically as needed (rash).   Yes [provider]  clonazePAM (KLONOPIN) 0.5 MG tablet Take 0.5 mg by mouth at bedtime.   Yes [provider]  gabapentin (NEURONTIN) 600 MG tablet TAKE 1 TABLET(600 MG) BY MOUTH THREE TIMES DAILY Patient  taking differently: Take 600 mg by mouth 3 (three) times daily. TAKE 1 TABLET(600 MG) BY MOUTH THREE TIMES DAILY 04/27/20  Yes Patel, Donika K, DO  hyoscyamine (LEVSIN SL) 0.125 MG SL tablet Place 1 tablet (0.125 mg total) under the tongue every 6 (six) hours as needed. Patient taking differently: Place 0.125 mg under the tongue every 6 (six) hours as needed for cramping. 07/14/20  Yes Esterwood, Amy S, PA-C  lamoTRIgine (LAMICTAL) 200 MG tablet Take 200 mg by mouth daily.   Yes [provider]  levothyroxine (SYNTHROID, LEVOTHROID) 112 MCG tablet Take 112 mcg by mouth at bedtime. Brand name only   Yes [provider]  lisinopril (PRINIVIL,ZESTRIL) 10 MG tablet Take 10 mg by mouth 3 (three) times a week. Take on Monday, Wednesday, Friday   Yes [provider]  mupirocin ointment (BACTROBAN) 2 % Apply 1 application topically daily as needed (cuts, wounds). 10/20/15  Yes [provider]  OVER THE COUNTER MEDICATION Take 1 tablet by mouth in the morning, at noon, and at bedtime. D-Mannose   Yes [provider]  hydrocortisone (ANUSOL-HC) 2.5 % rectal cream Place 1 application rectally 3 (three) times daily. 07/14/20   Esterwood, Amy S, PA-C    Scheduled Meds: . amisulpride      . amisulpride      . [MAR Hold] clonazePAM  0.5 mg Oral QHS  . [MAR Hold] enoxaparin (LOVENOX) injection  40 mg Subcutaneous Q24H  . fentaNYL      . [MAR Hold] gabapentin  600 mg Oral TID  . [MAR Hold] lamoTRIgine  200 mg Oral Daily  . [MAR Hold] levothyroxine  112 mcg Oral QAC breakfast  . [MAR Hold] lisinopril  10 mg Oral Once per day on Mon Wed Fri   Infusions: . [MAR Hold] cefTRIAXone (ROCEPHIN)  IV 2 g (08/01/20 0904)  . dextrose 5 % and 0.45 % NaCl with KCl 20 mEq/L 100 mL/hr at 07/31/20 2155  . lactated ringers     PRN Meds: [MAR Hold] acetaminophen, fentaNYL (SUBLIMAZE) injection, [MAR Hold] metoprolol tartrate, [MAR Hold]  morphine injection, [MAR Hold] ondansetron  **OR** [MAR Hold] ondansetron (ZOFRAN) IV, oxyCODONE **OR** oxyCODONE, promethazine, [MAR Hold] simethicone, [MAR Hold] traMADol   Allergies as of 07/31/2020 - Review Complete 07/31/2020  Allergen Reaction Noted  . Sulfa antibiotics Hives 11/27/2014  . Carbamazepine  10/27/2007  . Sulfonamide derivatives  10/27/2007    Family History  Problem Relation Age of Onset  . Bipolar disorder Mother   . Osteoporosis Mother   . Colon polyps Mother   . Stroke Mother  Deceased, 27  . Neuropathy Mother   . Peripheral vascular disease Maternal Grandmother   . Pancreatic cancer Maternal Grandmother   . Colon polyps Father   . Lung disease Father        Deceased, 49  . Diverticulitis Sister   . Neuropathy Maternal Grandfather   . Colon cancer Neg Hx   . Breast cancer Neg Hx     Social History   Socioeconomic History  . Marital status: Married    Spouse name: Not on file  . Number of children: 2  . Years of education: 59  . Highest education level: Not on file  Occupational History  . Occupation: Futures trader, and retired Programmer, multimedia: [employer]  Tobacco Use  . Smoking status: Never Smoker  . Smokeless tobacco: Never Used  Vaping Use  . Vaping Use: Never used  Substance and Sexual Activity  . Alcohol use: Yes    Alcohol/week: 0.0 standard drinks    Comment: occ.  . Drug use: No  . Sexual activity: Not on file  Other Topics Concern  . 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    She is married (60 years) and has two grown children.    Highest level of education:  Associates in [place]   She keeps her grandchildren 3 days a week.    Lives in a 2 story home.    Right handed   Social Determinants of Health   Financial Resource Strain: Not on file  Food Insecurity: Not on file  Transportation Needs: Not on file  Physical Activity: Not on file  Stress: Not on file  Social Connections: Not on file   Intimate Partner Violence: Not on file    REVIEW OF SYSTEMS: Constitutional: Generally has good energy, no issues with fatigue. ENT:  No nose bleeds Pulm: No shortness of breath.  No cough. CV:  No palpitations, no LE edema.  Angina GU:  No hematuria, no frequency GI: See HPI. Heme: Denies unusual or excessive bleeding or bruising. Transfusions: None Neuro:  No headaches, no peripheral tingling or numbness.  No CVA or PAD, no seizures, no dizziness. Derm:  No itching, no rash or sores.  Endocrine:  No sweats or chills.  No polyuria or dysuria Immunization: Not queried    PHYSICAL EXAM: Vital signs in last 24 hours: Vitals:   08/01/20 1217 08/01/20 1233  BP: (!) 146/67 118/60  Pulse: 81 78  Resp: 20 17  Temp: 97.9 F (36.6 C)   SpO2: 99% 93%   Wt Readings from Last 3 Encounters:  07/31/20 68 kg  07/14/20 67.6 kg  04/27/20 68 kg    General: Pleasant, comfortable.  Looks well.  Moving a bit slowly after surgery earlier today Head: No facial asymmetry or swelling.  No signs of head trauma. Eyes: No scleral icterus.  No conjunctival pallor.  EOMI Ears: Not hard of hearing Nose: Congestion or discharge Mouth: Moist, pink, clear mucosa.  Tongue midline.  Good dentition. Neck: No JVD, no masses, no thyromegaly. Lungs: No labored breathing or cough.  Lungs CTA bilaterally. Heart: RRR with soft/subtle systolic murmur.  S1, S2 present. Abdomen: Distended, soft, lightly palpated with tenderness over in the right upper quadrant.  His incisions clean dry and intact.   Rectal: Deferred Musc/Skeltl: No gross joint redness, deformity or swelling Extremities: No CCE Neurologic: Oriented x3.  Moves all 4 limbs, no tremor.  Strength not tested. Skin: No rash, no sores, no telangiectasia. Nodes: No cervical adenopathy Psych: Calm, pleasant, animated.  In good spirits.  Intake/Output from previous day: 02/14 0701 - 02/15 0700 In: 642.8 [P.O.:240; I.V.:302.8; IV  Piggyback:100] Out: 250 [Emesis/NG output:250] Intake/Output this shift: Total I/O In: 600 [I.V.:600] Out: 75 [Blood:75]  LAB RESULTS: Recent Labs    07/31/20 1115 08/01/20 0115  WBC 9.0 12.7*  HGB 10.6* 9.2*  HCT 34.1* 27.3*  PLT 434* 334   BMET Lab Results  Component Value Date   NA 134 (L) 08/01/2020   NA 136 07/31/2020   NA 136 12/06/2015   K 3.8 08/01/2020   K 3.7 07/31/2020   K 3.8 12/06/2015   CL 103 08/01/2020   CL 104 07/31/2020   CL 105 12/06/2015   CO2 23 08/01/2020   CO2 21 (L) 07/31/2020   CO2 25 12/06/2015   GLUCOSE 182 (H) 08/01/2020   GLUCOSE 158 (H) 07/31/2020   GLUCOSE 166 (H) 12/06/2015   BUN 13 08/01/2020   BUN 15 07/31/2020   BUN 15 12/06/2015   CREATININE 0.94 08/01/2020   CREATININE 1.00 07/31/2020   CREATININE 0.98 12/06/2015   CALCIUM 8.1 (L) 08/01/2020   CALCIUM 8.7 (L) 07/31/2020   CALCIUM 8.1 (L) 12/06/2015   LFT Recent Labs    07/31/20 1115 08/01/20 0115  PROT 7.1 6.0*  ALBUMIN 3.8 3.1*  AST 263* 375*  ALT 137* 330*  ALKPHOS 125 145*  BILITOT 1.4* 2.6*   PT/INR Lab Results  Component Value Date   INR 1.01 12/04/2015   Hepatitis Panel No results for input(s): HEPBSAG, HCVAB, HEPAIGM, HEPBIGM in the last 72 hours. C-Diff No components found for: CDIFF Lipase     Component Value Date/Time   LIPASE 100 (H) 07/31/2020 1115    Drugs of Abuse  No results found for: LABOPIA, COCAINSCRNUR, LABBENZ, AMPHETMU, THCU, LABBARB   RADIOLOGY STUDIES: DG Chest 2 View  Result Date: 07/31/2020 CLINICAL DATA:  Epigastric pain, vomiting EXAM: CHEST - 2 VIEW COMPARISON:  09/23/2005 FINDINGS: Heart and mediastinal contours are within normal limits. No focal opacities or effusions. No acute bony abnormality. IMPRESSION: No active cardiopulmonary disease. Electronically Signed   By: Rolm Baptise M.D.   On: 07/31/2020 11:50   CT ABDOMEN PELVIS W CONTRAST  Result Date: 07/31/2020 CLINICAL DATA:  Epigastric pain and fever. Possible  urinary tract infection. EXAM: CT ABDOMEN AND PELVIS WITH CONTRAST TECHNIQUE: Multidetector CT imaging of the abdomen and pelvis was performed using the standard protocol following bolus administration of intravenous contrast. CONTRAST:  117m OMNIPAQUE IOHEXOL 350 MG/ML SOLN COMPARISON:  Abdominal ultrasound 06/13/2005 FINDINGS: Lower chest: Right lower lobe peribronchovascular nodule measuring 5 by 4 by 3 mm (volume = 30 mm^3) on image 20 of series 5. A left lower lobe nodule near the diaphragm measures 8 by 6 by 7 mm (volume = 200 mm^3) on image 17 series 5. Descending thoracic aortic atherosclerotic calcification along with right coronary atherosclerosis. Mitral valve calcification. Hepatobiliary: Dorsal gallbladder wall thickening versus pericholecystic fluid. Clustered dependent gallstones measuring up to 0.8 cm in diameter. Dilated common bile duct at 1.1 cm in diameter on image 45 series 6, without definite choledocholithiasis visible. Punctate calcification in the dome of the liver compatible with old granulomatous disease. Minimal periportal edema. Pancreas: Unremarkable Spleen: Unremarkable Adrenals/Urinary Tract: Small amount of gas in the urinary bladder likely from recent catheterization. No substantial degree of urinary bladder wall thickening. Small hypodense lesions in the left kidney are technically  too small to characterize although statistically likely to be cysts. Stomach/Bowel: Small periampullary duodenal diverticulum. Normal appendix. Sigmoid colon diverticulosis. No dilated bowel. No acute diverticulitis. Vascular/Lymphatic: Aortoiliac atherosclerotic vascular disease. Reproductive: Uterus absent.  Adnexa unremarkable. Other: No supplemental non-categorized findings. Musculoskeletal: Levoconvex lumbar scoliosis with rotary component. Mild chondrocalcinosis along the acetabular labrum. Lumbar spondylosis and degenerative disc disease contributing to left foraminal narrowing at L4-5 and L5-S1,  and right foraminal narrowing at L3-4 and L4-5. Low position of the anorectal junction compatible with pelvic floor laxity. IMPRESSION: 1. Dorsal gallbladder wall thickening versus pericholecystic fluid. Cholelithiasis. Correlate clinically in assessing for acute cholecystitis. 2. Dilated common bile duct at 1.1 cm in diameter without definite choledocholithiasis visible. 3. If clinically warranted, nuclear medicine hepatobiliary scan could be utilized to assess for patency of the cystic duct and common bile duct. 4. Bilateral lower lobe pulmonary nodules, the largest measuring 200 mm in diameter in the left lower lobe. Non-contrast chest CT at 6-12 months is recommended. If the nodule is stable at time of repeat CT, then future CT at 18-24 months (from today's scan) is considered optional for low-risk patients, but is recommended for high-risk patients. This recommendation follows the consensus statement: Guidelines for Management of Incidental Pulmonary Nodules Detected on CT Images: From the Fleischner Society 2017; Radiology 2017; 284:228-243. 5. Other imaging findings of potential clinical significance: Coronary atherosclerosis. Mitral valve calcification. Sigmoid colon diverticulosis. Low position of the anorectal junction compatible with pelvic floor laxity. Lumbar spondylosis and degenerative disc disease causing multilevel impingement. Mild nonspecific periportal edema. 6. Aortic atherosclerosis. Aortic Atherosclerosis (ICD10-I70.0). Electronically Signed   By: Van Clines M.D.   On: 07/31/2020 14:47      IMPRESSION:   *     Choledocholithiasis on IOC today.  *     Cholecystitis, lap chole today 2/15.    PLAN:     *  ERCP, unfortunately unable to accommodate this tomorrow so it is scheduled for Thursday 12/17 at 10:30 AM. Patient is hungry, clear liquids are ordered.  If she is doing well tomorrow her diet can be advanced but she needs n.p.o. after tomorrow midnight for the ERCP the  following day. She is getting Rocephin daily so no additional antibiotics required pre procedure. Prefer that she not receive Lovenox which is scheduled for 10 AM tomorrow.  Will discontinue this and order PAS stockings.  Fortunately the patient is also quite adamant about being as ambulatory as possible.  She hopes to discharge from the hospital as soon as possible.   Azucena Freed  08/01/2020, 12:52 PM Phone [phone]     Attending Physician Note   I have taken a history, examined the patient and reviewed the chart. I agree with the Advanced Practitioner's note, impression and recommendations.  S/P lap chole today with elevated LFTs/t bili. IOC images reviewed showing a dilated biliary tree and suggestion of distal CBD stone. CT AP showed a dilated CBD at 1.1 cm yesterday. Plan for ERCP on Thursday as endoscopy unit / anaesthesia unable to accommodate tomorrow. Trend LFTs and continue Rocephin.   Lucio Edward, MD FACG [phone]

## 2020-08-01 NOTE — Op Note (Signed)
08/01/2020  11:55 AM  PATIENT:  Theresa Underwood  80 y.o. female  PRE-OPERATIVE DIAGNOSIS:  CHOLECYSTITIS  POST-OPERATIVE DIAGNOSIS:  CHOLECYSTITIS  PROCEDURE:  Procedure(s): LAPAROSCOPIC CHOLECYSTECTOMY WITH INTRAOPERATIVE CHOLANGIOGRAM (N/A)  SURGEON:  Surgeon(s) and Role:    * Ralene Ok, MD - Primary  ASSISTANTS: Ewell Poe, RNFA   ANESTHESIA:   local and general  EBL:  minimal   BLOOD ADMINISTERED:none  DRAINS: none   LOCAL MEDICATIONS USED:  BUPIVICAINE   SPECIMEN:  Source of Specimen:  gallbladder  DISPOSITION OF SPECIMEN:  PATHOLOGY  COUNTS:  YES  TOURNIQUET:  * No tourniquets in log *  DICTATION: .Dragon Dictation The patient was taken to the operating and placed in the supine position with bilateral SCDs in place.  The patient was prepped and draped in the usual sterile fashion. A time out was called and all facts were verified. A pneumoperitoneum was obtained via A Veress needle technique to a pressure of 47mm of mercury.  A 7mm trochar was then placed in the right upper quadrant under visualization, and there were no injuries to any abdominal organs. A 11 mm port was then placed in the umbilical region after infiltrating with local anesthesia under direct visualization. A second and third epigastric port and right lower quadrant port placement under direct visualization, respectively.     The gallbladder was identified and retracted, the peritoneum was then sharply dissected from the gallbladder and this dissection was carried down to Calot's triangle. The cystic duct was identified and stripped away circumferentially and seen going into the gallbladder 360, the critical angle was obtained. A Cook catheter was used to perform an intraoperative cholangiogram. The cholangiogram showed filling defects and the ampulla and the contrast emptied into the duodenum a little but after some time.  The hepatic ducts were seen to be free of any filling defects.  The  common bile duct was very large. 2 clips were placed proximally one distally and the cystic duct transected. The cystic artery was identified and 2 clips placed proximally and one distally and transected.    We then proceeded to remove the gallbladder off the hepatic fossa with Bovie cautery. A retrieval bag was then placed in the abdomen and gallbladder placed in the bag. The hepatic fossa was then reexamined and hemostasis was achieved with Bovie cautery and was excellent at the end of the case. The subhepatic fossa and perihepatic fossa was then irrigated until the effluent was clear. The 11 mm trocar fascia was reapproximated with the PMI & a #1 Vicryl.  The pneumoperitoneum was evacuated and all trochars removed under direct visulalization.  The skin was then closed with 4-0 Monocryl and the skin dressed with Dermabond.  The patient was awaken from general anesthesia and taken to the recovery room in stable condition.    PLAN OF CARE: Discharge to home after PACU  PATIENT DISPOSITION:  PACU - hemodynamically stable.   Delay start of Pharmacological VTE agent (>24hrs) due to surgical blood loss or risk of bleeding: yes

## 2020-08-01 NOTE — Progress Notes (Addendum)
PHARMACY - PHYSICIAN COMMUNICATION CRITICAL VALUE ALERT - BLOOD CULTURE IDENTIFICATION (BCID)  Theresa Underwood is an 80 y.o. female who presented to [facility] on 07/31/2020 with a chief complaint of cholelithiasis awaiting cholecystectomy  Assessment:   1/2 blood cultures growing E. coli  Name of physician (or Provider) Contacted: Dr. Bobbye Morton and Dr. Rosendo Gros via Secure Chat  Current antibiotics: Rocephin  Changes to prescribed antibiotics recommended:  None at this time  Results for orders placed or performed during the hospital encounter of 07/31/20  Blood Culture ID Panel (Reflexed) (Collected: 07/31/2020  1:02 PM)  Result Value Ref Range   Enterococcus faecalis NOT DETECTED NOT DETECTED   Enterococcus Faecium NOT DETECTED NOT DETECTED   Listeria monocytogenes NOT DETECTED NOT DETECTED   Staphylococcus species NOT DETECTED NOT DETECTED   Staphylococcus aureus (BCID) NOT DETECTED NOT DETECTED   Staphylococcus epidermidis NOT DETECTED NOT DETECTED   Staphylococcus lugdunensis NOT DETECTED NOT DETECTED   Streptococcus species NOT DETECTED NOT DETECTED   Streptococcus agalactiae NOT DETECTED NOT DETECTED   Streptococcus pneumoniae NOT DETECTED NOT DETECTED   Streptococcus pyogenes NOT DETECTED NOT DETECTED   A.calcoaceticus-baumannii NOT DETECTED NOT DETECTED   Bacteroides fragilis NOT DETECTED NOT DETECTED   Enterobacterales DETECTED (A) NOT DETECTED   Enterobacter cloacae complex NOT DETECTED NOT DETECTED   Escherichia coli DETECTED (A) NOT DETECTED   Klebsiella aerogenes NOT DETECTED NOT DETECTED   Klebsiella oxytoca NOT DETECTED NOT DETECTED   Klebsiella pneumoniae NOT DETECTED NOT DETECTED   Proteus species NOT DETECTED NOT DETECTED   Salmonella species NOT DETECTED NOT DETECTED   Serratia marcescens NOT DETECTED NOT DETECTED   Haemophilus influenzae NOT DETECTED NOT DETECTED   Neisseria meningitidis NOT DETECTED NOT DETECTED   Pseudomonas aeruginosa NOT DETECTED NOT  DETECTED   Stenotrophomonas maltophilia NOT DETECTED NOT DETECTED   Candida albicans NOT DETECTED NOT DETECTED   Candida auris NOT DETECTED NOT DETECTED   Candida glabrata NOT DETECTED NOT DETECTED   Candida krusei NOT DETECTED NOT DETECTED   Candida parapsilosis NOT DETECTED NOT DETECTED   Candida tropicalis NOT DETECTED NOT DETECTED   Cryptococcus neoformans/gattii NOT DETECTED NOT DETECTED   CTX-M ESBL NOT DETECTED NOT DETECTED   Carbapenem resistance IMP NOT DETECTED NOT DETECTED   Carbapenem resistance KPC NOT DETECTED NOT DETECTED   Carbapenem resistance NDM NOT DETECTED NOT DETECTED   Carbapenem resist OXA 48 LIKE NOT DETECTED NOT DETECTED   Carbapenem resistance VIM NOT DETECTED NOT DETECTED    Caryl Pina 08/01/2020  3:57 AM

## 2020-08-01 NOTE — Telephone Encounter (Signed)
Patient presented to ED 07/31/20 and is currently admitted.

## 2020-08-02 ENCOUNTER — Encounter (HOSPITAL_COMMUNITY): Payer: Self-pay | Admitting: General Surgery

## 2020-08-02 DIAGNOSIS — R7989 Other specified abnormal findings of blood chemistry: Secondary | ICD-10-CM | POA: Diagnosis not present

## 2020-08-02 DIAGNOSIS — R932 Abnormal findings on diagnostic imaging of liver and biliary tract: Secondary | ICD-10-CM | POA: Diagnosis not present

## 2020-08-02 DIAGNOSIS — K8051 Calculus of bile duct without cholangitis or cholecystitis with obstruction: Secondary | ICD-10-CM | POA: Diagnosis not present

## 2020-08-02 LAB — CBC
HCT: 26.5 % — ABNORMAL LOW (ref 36.0–46.0)
Hemoglobin: 8.6 g/dL — ABNORMAL LOW (ref 12.0–15.0)
MCH: 31.2 pg (ref 26.0–34.0)
MCHC: 32.5 g/dL (ref 30.0–36.0)
MCV: 96 fL (ref 80.0–100.0)
Platelets: 284 10*3/uL (ref 150–400)
RBC: 2.76 MIL/uL — ABNORMAL LOW (ref 3.87–5.11)
RDW: 13 % (ref 11.5–15.5)
WBC: 12.4 10*3/uL — ABNORMAL HIGH (ref 4.0–10.5)
nRBC: 0 % (ref 0.0–0.2)

## 2020-08-02 LAB — URINE CULTURE: Culture: >=100000 — AB

## 2020-08-02 LAB — GLUCOSE, CAPILLARY
Glucose-Capillary: 140 mg/dL — ABNORMAL HIGH (ref 70–99)
Glucose-Capillary: 120 mg/dL — ABNORMAL HIGH (ref 70–99)

## 2020-08-02 LAB — COMPREHENSIVE METABOLIC PANEL
ALT: 190 U/L — ABNORMAL HIGH (ref 0–44)
AST: 114 U/L — ABNORMAL HIGH (ref 15–41)
Albumin: 2.8 g/dL — ABNORMAL LOW (ref 3.5–5.0)
Alkaline Phosphatase: 117 U/L (ref 38–126)
Anion gap: 9 (ref 5–15)
BUN: 12 mg/dL (ref 8–23)
CO2: 20 mmol/L — ABNORMAL LOW (ref 22–32)
Calcium: 7.9 mg/dL — ABNORMAL LOW (ref 8.9–10.3)
Chloride: 102 mmol/L (ref 98–111)
Creatinine, Ser: 0.98 mg/dL (ref 0.44–1.00)
GFR, Estimated: 59 mL/min — ABNORMAL LOW (ref >60)
Glucose, Bld: 325 mg/dL — ABNORMAL HIGH (ref 70–99)
Potassium: 4.2 mmol/L (ref 3.5–5.1)
Sodium: 131 mmol/L — ABNORMAL LOW (ref 135–145)
Total Bilirubin: 2.8 mg/dL — ABNORMAL HIGH (ref 0.3–1.2)
Total Protein: 5.7 g/dL — ABNORMAL LOW (ref 6.5–8.1)

## 2020-08-02 LAB — HEMOGLOBIN A1C
Hgb A1c MFr Bld: 6.6 % — ABNORMAL HIGH (ref 4.8–5.6)
Mean Plasma Glucose: 142.72 mg/dL

## 2020-08-02 LAB — SURGICAL PATHOLOGY

## 2020-08-02 MED ORDER — INSULIN ASPART 100 UNIT/ML ~~LOC~~ SOLN: 0.0000 [IU] | Freq: Three times a day (TID) | SUBCUTANEOUS | Status: DC

## 2020-08-02 MED ORDER — POTASSIUM CHLORIDE IN NACL 20-0.9 MEQ/L-% IV SOLN
INTRAVENOUS | Status: DC
  Filled 2020-08-02 (×4): qty 1000

## 2020-08-02 MED ORDER — SODIUM CHLORIDE 0.9 % IV SOLN: INTRAVENOUS | Status: DC

## 2020-08-02 MED ORDER — INSULIN ASPART 100 UNIT/ML ~~LOC~~ SOLN: 0.0000 [IU] | Freq: Every day | SUBCUTANEOUS | Status: DC

## 2020-08-02 NOTE — H&P (View-Only) (Signed)
Daily Rounding Note  08/02/2020, 11:00 AM  LOS: 1 day   SUBJECTIVE:   Chief complaint:   Choledocholithiasis  Patient is feeling well.  Not a whole lot of abdominal pain and only getting Tylenol for pain management.  No nausea.  Had full liquids this morning.  OBJECTIVE:         Vital signs in last 24 hours:    Temp:  [97.5 F (36.4 C)-98.3 F (36.8 C)] 97.6 F (36.4 C) (02/16 0808) Pulse Rate:  [67-81] 71 (02/16 0808) Resp:  [11-20] 16 (02/16 0808) BP: (101-146)/(52-67) 124/65 (02/16 0808) SpO2:  [93 %-99 %] 98 % (02/16 0808) Last BM Date: 08/01/20 Filed Weights   07/31/20 1050  Weight: 68 kg   General: Pleasant, comfortable, looks well Heart: RRR with soft murmur Chest: Clear bilaterally.  No cough or labored breathing Abdomen: Distended in the lower abdomen, minimal tenderness in the right upper abdomen.  Surgical incisions CDI. Extremities: No CCE Neuro/Psych: Alert.  Appropriate.  Fluid speech.  Moves all 4 limbs.  Oriented x3.  Loquacious  Intake/Output from previous day: 02/15 0701 - 02/16 0700 In: 2560 [P.O.:560; I.V.:2000] Out: 77 [Urine:2; Blood:75]  Intake/Output this shift: No intake/output data recorded.  Lab Results: Recent Labs    07/31/20 1115 08/01/20 0115 08/02/20 0235  WBC 9.0 12.7* 12.4*  HGB 10.6* 9.2* 8.6*  HCT 34.1* 27.3* 26.5*  PLT 434* 334 284   BMET Recent Labs    07/31/20 1115 08/01/20 0115 08/02/20 0235  NA 136 134* 131*  K 3.7 3.8 4.2  CL 104 103 102  CO2 21* 23 20*  GLUCOSE 158* 182* 325*  BUN 15 13 12   CREATININE 1.00 0.94 0.98  CALCIUM 8.7* 8.1* 7.9*   LFT Recent Labs    07/31/20 1115 08/01/20 0115 08/02/20 0235  PROT 7.1 6.0* 5.7*  ALBUMIN 3.8 3.1* 2.8*  AST 263* 375* 114*  ALT 137* 330* 190*  ALKPHOS 125 145* 117  BILITOT 1.4* 2.6* 2.8*    Studies/Results: DG Chest 2 View  Result Date: 07/31/2020 CLINICAL DATA:  Epigastric pain, vomiting  EXAM: CHEST - 2 VIEW COMPARISON:  09/23/2005 FINDINGS: Heart and mediastinal contours are within normal limits. No focal opacities or effusions. No acute bony abnormality. IMPRESSION: No active cardiopulmonary disease. Electronically Signed   By: Rolm Baptise M.D.   On: 07/31/2020 11:50   DG Cholangiogram Operative  Result Date: 08/01/2020 CLINICAL DATA:  80 year old female undergoing laparoscopic cholecystectomy. EXAM: INTRAOPERATIVE CHOLANGIOGRAM COMPARISON:  CT abdomen pelvis from 07/31/2020 FLUOROSCOPY TIME:  Fluoroscopy Time:  38 seconds Radiation Exposure Index (if provided by the fluoroscopic device): 13.1 mGy Number of Acquired Spot Images: 0 FINDINGS: Intraoperative cholangiogram with injection via the cystic duct demonstrates moderate dilation of the extrahepatic biliary tree and mild dilation of the central intrahepatic ducts. There is suggestion of a rounded filling defect in the distal common bile duct. There is passage of contrast into the duodenum. IMPRESSION: Suggestion of partially obstructive distal choledocholithiasis. Consider ERCP for further evaluation. Ruthann Cancer, MD [facility] Rad[facility] Electronically Signed   By: Ruthann Cancer MD   On: 08/01/2020 14:16   CT ABDOMEN PELVIS W CONTRAST  Result Date: 07/31/2020 CLINICAL DATA:  Epigastric pain and fever. Possible urinary tract infection. EXAM: CT ABDOMEN AND PELVIS WITH CONTRAST TECHNIQUE: Multidetector CT imaging of the abdomen and pelvis was performed using the standard protocol following bolus administration of intravenous contrast. CONTRAST:  11mL OMNIPAQUE IOHEXOL 350 MG/ML SOLN COMPARISON:  Abdominal ultrasound 06/13/2005 FINDINGS: Lower chest: Right lower lobe peribronchovascular nodule measuring 5 by 4 by 3 mm (volume = 30 mm^3) on image 20 of series 5. A left lower lobe nodule near the diaphragm measures 8 by 6 by 7 mm (volume = 200 mm^3) on image 17 series 5. Descending  thoracic aortic atherosclerotic calcification along with right coronary atherosclerosis. Mitral valve calcification. Hepatobiliary: Dorsal gallbladder wall thickening versus pericholecystic fluid. Clustered dependent gallstones measuring up to 0.8 cm in diameter. Dilated common bile duct at 1.1 cm in diameter on image 45 series 6, without definite choledocholithiasis visible. Punctate calcification in the dome of the liver compatible with old granulomatous disease. Minimal periportal edema. Pancreas: Unremarkable Spleen: Unremarkable Adrenals/Urinary Tract: Small amount of gas in the urinary bladder likely from recent catheterization. No substantial degree of urinary bladder wall thickening. Small hypodense lesions in the left kidney are technically too small to characterize although statistically likely to be cysts. Stomach/Bowel: Small periampullary duodenal diverticulum. Normal appendix. Sigmoid colon diverticulosis. No dilated bowel. No acute diverticulitis. Vascular/Lymphatic: Aortoiliac atherosclerotic vascular disease. Reproductive: Uterus absent.  Adnexa unremarkable. Other: No supplemental non-categorized findings. Musculoskeletal: Levoconvex lumbar scoliosis with rotary component. Mild chondrocalcinosis along the acetabular labrum. Lumbar spondylosis and degenerative disc disease contributing to left foraminal narrowing at L4-5 and L5-S1, and right foraminal narrowing at L3-4 and L4-5. Low position of the anorectal junction compatible with pelvic floor laxity. IMPRESSION: 1. Dorsal gallbladder wall thickening versus pericholecystic fluid. Cholelithiasis. Correlate clinically in assessing for acute cholecystitis. 2. Dilated common bile duct at 1.1 cm in diameter without definite choledocholithiasis visible. 3. If clinically warranted, nuclear medicine hepatobiliary scan could be utilized to assess for patency of the cystic duct and common bile duct. 4. Bilateral lower lobe pulmonary nodules, the largest  measuring 200 mm in diameter in the left lower lobe. Non-contrast chest CT at 6-12 months is recommended. If the nodule is stable at time of repeat CT, then future CT at 18-24 months (from today's scan) is considered optional for low-risk patients, but is recommended for high-risk patients. This recommendation follows the consensus statement: Guidelines for Management of Incidental Pulmonary Nodules Detected on CT Images: From the Fleischner Society 2017; Radiology 2017; 284:228-243. 5. Other imaging findings of potential clinical significance: Coronary atherosclerosis. Mitral valve calcification. Sigmoid colon diverticulosis. Low position of the anorectal junction compatible with pelvic floor laxity. Lumbar spondylosis and degenerative disc disease causing multilevel impingement. Mild nonspecific periportal edema. 6. Aortic atherosclerosis. Aortic Atherosclerosis (ICD10-I70.0). Electronically Signed   By: Van Clines M.D.   On: 07/31/2020 14:47    ASSESMENT:   *    Choledocholithiasis at [facility] 2/15. Elevated LFTs improved.  *    2/15 lap chole.  *   Hyponatremia.  *    [place] anemia.  Chronic.  Hb 10.6 >> 8.6.  Most recent comp HGb was 9.3 in 11/2015.   PLAN   *   ERCP set for tomorrow at 10:30 AM.  *   Advance to low-fat diet.   n.p.o. after midnight.  *   Anemia/iron studies in the morning.  *   Continue daily Rocephin.   Azucena Freed  08/02/2020, 11:00 AM Phone [phone]     Attending Physician Note   I have taken an interval history, reviewed the chart and examined the patient. I agree with the Advanced Practitioner's note, impression and recommendations.   Choledocholithiasis and dilated CBD on IOC. Elevated LFTs. E coli  bacteremia. Continue Rocephin. Trend LFTs. S/P lap chole. ERCP tomorrow morning.   Klebsiella UTI.   Hyponatremia, worsening. Trend electrolytes.    Lucio Edward, MD FACG [phone]

## 2020-08-02 NOTE — Progress Notes (Signed)
Inpatient Diabetes Program Recommendations  AACE/ADA: New Consensus Statement on Inpatient Glycemic Control (2015)  Target Ranges:  Prepandial:   less than 140 mg/dL      Peak postprandial:   less than 180 mg/dL (1-2 hours)      Critically ill patients:  140 - 180 mg/dL    Review of Glycemic Control Results for TRACEY, HERMANCE" (MRN [id number]) as of 08/02/2020 11:40  Ref. Range 08/02/2020 02:35  Glucose Latest Ref Range: 70 - 99 mg/dL 325 (H)   Cholelithiasis Diabetes history: glucose intolerance  Current orders for Inpatient glycemic control: none  Inpatient Diabetes Program Recommendations:    Lab glucose in 300's after decadron 10 mg given yesterday.   -  Consider Novolog 0-15 units tid + hs  Thanks,  Tama Headings RN, MSN, BC-ADM Inpatient Diabetes Coordinator Team Pager [phone] (8a-5p)

## 2020-08-02 NOTE — Progress Notes (Addendum)
Daily Rounding Note  08/02/2020, 11:00 AM  LOS: 1 day   SUBJECTIVE:   Chief complaint:   Choledocholithiasis  Patient is feeling well.  Not a whole lot of abdominal pain and only getting Tylenol for pain management.  No nausea.  Had full liquids this morning.  OBJECTIVE:         Vital signs in last 24 hours:    Temp:  [97.5 F (36.4 C)-98.3 F (36.8 C)] 97.6 F (36.4 C) (02/16 0808) Pulse Rate:  [67-81] 71 (02/16 0808) Resp:  [11-20] 16 (02/16 0808) BP: (101-146)/(52-67) 124/65 (02/16 0808) SpO2:  [93 %-99 %] 98 % (02/16 0808) Last BM Date: 08/01/20 Filed Weights   07/31/20 1050  Weight: 68 kg   General: Pleasant, comfortable, looks well Heart: RRR with soft murmur Chest: Clear bilaterally.  No cough or labored breathing Abdomen: Distended in the lower abdomen, minimal tenderness in the right upper abdomen.  Surgical incisions CDI. Extremities: No CCE Neuro/Psych: Alert.  Appropriate.  Fluid speech.  Moves all 4 limbs.  Oriented x3.  Loquacious  Intake/Output from previous day: 02/15 0701 - 02/16 0700 In: 2560 [P.O.:560; I.V.:2000] Out: 77 [Urine:2; Blood:75]  Intake/Output this shift: No intake/output data recorded.  Lab Results: Recent Labs    07/31/20 1115 08/01/20 0115 08/02/20 0235  WBC 9.0 12.7* 12.4*  HGB 10.6* 9.2* 8.6*  HCT 34.1* 27.3* 26.5*  PLT 434* 334 284   BMET Recent Labs    07/31/20 1115 08/01/20 0115 08/02/20 0235  NA 136 134* 131*  K 3.7 3.8 4.2  CL 104 103 102  CO2 21* 23 20*  GLUCOSE 158* 182* 325*  BUN 15 13 12   CREATININE 1.00 0.94 0.98  CALCIUM 8.7* 8.1* 7.9*   LFT Recent Labs    07/31/20 1115 08/01/20 0115 08/02/20 0235  PROT 7.1 6.0* 5.7*  ALBUMIN 3.8 3.1* 2.8*  AST 263* 375* 114*  ALT 137* 330* 190*  ALKPHOS 125 145* 117  BILITOT 1.4* 2.6* 2.8*    Studies/Results: DG Chest 2 View  Result Date: 07/31/2020 CLINICAL DATA:  Epigastric pain, vomiting  EXAM: CHEST - 2 VIEW COMPARISON:  09/23/2005 FINDINGS: Heart and mediastinal contours are within normal limits. No focal opacities or effusions. No acute bony abnormality. IMPRESSION: No active cardiopulmonary disease. Electronically Signed   By: Rolm Baptise M.D.   On: 07/31/2020 11:50   DG Cholangiogram Operative  Result Date: 08/01/2020 CLINICAL DATA:  80 year old female undergoing laparoscopic cholecystectomy. EXAM: INTRAOPERATIVE CHOLANGIOGRAM COMPARISON:  CT abdomen pelvis from 07/31/2020 FLUOROSCOPY TIME:  Fluoroscopy Time:  38 seconds Radiation Exposure Index (if provided by the fluoroscopic device): 13.1 mGy Number of Acquired Spot Images: 0 FINDINGS: Intraoperative cholangiogram with injection via the cystic duct demonstrates moderate dilation of the extrahepatic biliary tree and mild dilation of the central intrahepatic ducts. There is suggestion of a rounded filling defect in the distal common bile duct. There is passage of contrast into the duodenum. IMPRESSION: Suggestion of partially obstructive distal choledocholithiasis. Consider ERCP for further evaluation. Ruthann Cancer, MD [facility] Radiology Electronically Signed   By: Ruthann Cancer MD   On: 08/01/2020 14:16   CT ABDOMEN PELVIS W CONTRAST  Result Date: 07/31/2020 CLINICAL DATA:  Epigastric pain and fever. Possible urinary tract infection. EXAM: CT ABDOMEN AND PELVIS WITH CONTRAST TECHNIQUE: Multidetector CT imaging of the abdomen and pelvis was performed using the standard protocol following bolus administration of intravenous contrast. CONTRAST:  127mL OMNIPAQUE IOHEXOL 350 MG/ML SOLN COMPARISON:  Abdominal ultrasound 06/13/2005 FINDINGS: Lower chest: Right lower lobe peribronchovascular nodule measuring 5 by 4 by 3 mm (volume = 30 mm^3) on image 20 of series 5. A left lower lobe nodule near the diaphragm measures 8 by 6 by 7 mm (volume = 200 mm^3) on image 17 series 5. Descending  thoracic aortic atherosclerotic calcification along with right coronary atherosclerosis. Mitral valve calcification. Hepatobiliary: Dorsal gallbladder wall thickening versus pericholecystic fluid. Clustered dependent gallstones measuring up to 0.8 cm in diameter. Dilated common bile duct at 1.1 cm in diameter on image 45 series 6, without definite choledocholithiasis visible. Punctate calcification in the dome of the liver compatible with old granulomatous disease. Minimal periportal edema. Pancreas: Unremarkable Spleen: Unremarkable Adrenals/Urinary Tract: Small amount of gas in the urinary bladder likely from recent catheterization. No substantial degree of urinary bladder wall thickening. Small hypodense lesions in the left kidney are technically too small to characterize although statistically likely to be cysts. Stomach/Bowel: Small periampullary duodenal diverticulum. Normal appendix. Sigmoid colon diverticulosis. No dilated bowel. No acute diverticulitis. Vascular/Lymphatic: Aortoiliac atherosclerotic vascular disease. Reproductive: Uterus absent.  Adnexa unremarkable. Other: No supplemental non-categorized findings. Musculoskeletal: Levoconvex lumbar scoliosis with rotary component. Mild chondrocalcinosis along the acetabular labrum. Lumbar spondylosis and degenerative disc disease contributing to left foraminal narrowing at L4-5 and L5-S1, and right foraminal narrowing at L3-4 and L4-5. Low position of the anorectal junction compatible with pelvic floor laxity. IMPRESSION: 1. Dorsal gallbladder wall thickening versus pericholecystic fluid. Cholelithiasis. Correlate clinically in assessing for acute cholecystitis. 2. Dilated common bile duct at 1.1 cm in diameter without definite choledocholithiasis visible. 3. If clinically warranted, nuclear medicine hepatobiliary scan could be utilized to assess for patency of the cystic duct and common bile duct. 4. Bilateral lower lobe pulmonary nodules, the largest  measuring 200 mm in diameter in the left lower lobe. Non-contrast chest CT at 6-12 months is recommended. If the nodule is stable at time of repeat CT, then future CT at 18-24 months (from today's scan) is considered optional for low-risk patients, but is recommended for high-risk patients. This recommendation follows the consensus statement: Guidelines for Management of Incidental Pulmonary Nodules Detected on CT Images: From the Fleischner Society 2017; Radiology 2017; 284:228-243. 5. Other imaging findings of potential clinical significance: Coronary atherosclerosis. Mitral valve calcification. Sigmoid colon diverticulosis. Low position of the anorectal junction compatible with pelvic floor laxity. Lumbar spondylosis and degenerative disc disease causing multilevel impingement. Mild nonspecific periportal edema. 6. Aortic atherosclerosis. Aortic Atherosclerosis (ICD10-I70.0). Electronically Signed   By: Van Clines M.D.   On: 07/31/2020 14:47    ASSESMENT:   *    Choledocholithiasis at [facility] 2/15. Elevated LFTs improved.  *    2/15 lap chole.  *   Hyponatremia.  *    [place] anemia.  Chronic.  Hb 10.6 >> 8.6.  Most recent comp HGb was 9.3 in 11/2015.   PLAN   *   ERCP set for tomorrow at 10:30 AM.  *   Advance to low-fat diet.   n.p.o. after midnight.  *   Anemia/iron studies in the morning.  *   Continue daily Rocephin.   Azucena Freed  08/02/2020, 11:00 AM Phone [phone]     Attending Physician Note   I have taken an interval history, reviewed the chart and examined the patient. I agree with the Advanced Practitioner's note, impression and recommendations.   Choledocholithiasis and dilated CBD on IOC. Elevated LFTs. E coli  bacteremia. Continue Rocephin. Trend LFTs. S/P lap chole. ERCP tomorrow morning.   Klebsiella UTI.   Hyponatremia, worsening. Trend electrolytes.    Lucio Edward, MD FACG [phone]

## 2020-08-02 NOTE — Anesthesia Postprocedure Evaluation (Signed)
Anesthesia Post Note  Patient: Theresa Underwood  Procedure(s) Performed: LAPAROSCOPIC CHOLECYSTECTOMY WITH INTRAOPERATIVE CHOLANGIOGRAM (N/A Abdomen)     Patient location during evaluation: PACU Anesthesia Type: General Level of consciousness: awake Pain management: pain level controlled Vital Signs Assessment: post-procedure vital signs reviewed and stable Respiratory status: spontaneous breathing and respiratory function stable Cardiovascular status: stable Postop Assessment: no apparent nausea or vomiting Anesthetic complications: no   No complications documented.  Last Vitals:  Vitals:   08/02/20 0404 08/02/20 0808  BP: (!) 101/52 124/65  Pulse: 67 71  Resp: 15 16  Temp: 36.8 C 36.4 C  SpO2: 97% 98%    Last Pain:  Vitals:   08/02/20 0808  TempSrc: Oral  PainSc:                  Merlinda Frederick

## 2020-08-02 NOTE — Progress Notes (Signed)
1 Day Post-Op  Subjective: Sore, but otherwise no complaints.  Tolerating regular diet.  + flatus.  No nausea.  ROS: See above, otherwise other systems negative  Objective: Vital signs in last 24 hours: Temp:  [97.5 F (36.4 C)-98.3 F (36.8 C)] 98.1 F (36.7 C) (02/16 1446) Pulse Rate:  [67-77] 72 (02/16 1446) Resp:  [15-18] 17 (02/16 1446) BP: (101-124)/(52-65) 110/59 (02/16 1446) SpO2:  [96 %-98 %] 98 % (02/16 1446) Last BM Date: 08/01/20  Intake/Output from previous day: 02/15 0701 - 02/16 0700 In: 2560 [P.O.:560; I.V.:2000] Out: 77 [Urine:2; Blood:75] Intake/Output this shift: No intake/output data recorded.  PE: Abd: soft, appropriately tender, +BS, ND, incisions c/d/i  Lab Results:  Recent Labs    08/01/20 0115 08/02/20 0235  WBC 12.7* 12.4*  HGB 9.2* 8.6*  HCT 27.3* 26.5*  PLT 334 284   BMET Recent Labs    08/01/20 0115 08/02/20 0235  NA 134* 131*  K 3.8 4.2  CL 103 102  CO2 23 20*  GLUCOSE 182* 325*  BUN 13 12  CREATININE 0.94 0.98  CALCIUM 8.1* 7.9*   PT/INR No results for input(s): LABPROT, INR in the last 72 hours. CMP     Component Value Date/Time   NA 131 (L) 08/02/2020 0235   K 4.2 08/02/2020 0235   CL 102 08/02/2020 0235   CO2 20 (L) 08/02/2020 0235   GLUCOSE 325 (H) 08/02/2020 0235   BUN 12 08/02/2020 0235   CREATININE 0.98 08/02/2020 0235   CALCIUM 7.9 (L) 08/02/2020 0235   PROT 5.7 (L) 08/02/2020 0235   ALBUMIN 2.8 (L) 08/02/2020 0235   AST 114 (H) 08/02/2020 0235   ALT 190 (H) 08/02/2020 0235   ALKPHOS 117 08/02/2020 0235   BILITOT 2.8 (H) 08/02/2020 0235   GFRNONAA 59 (L) 08/02/2020 0235   GFRAA >60 12/06/2015 0439   Lipase     Component Value Date/Time   LIPASE 100 (H) 07/31/2020 1115       Studies/Results: DG Cholangiogram Operative  Result Date: 08/01/2020 CLINICAL DATA:  80 year old female undergoing laparoscopic cholecystectomy. EXAM: INTRAOPERATIVE CHOLANGIOGRAM COMPARISON:  CT abdomen pelvis from  07/31/2020 FLUOROSCOPY TIME:  Fluoroscopy Time:  38 seconds Radiation Exposure Index (if provided by the fluoroscopic device): 13.1 mGy Number of Acquired Spot Images: 0 FINDINGS: Intraoperative cholangiogram with injection via the cystic duct demonstrates moderate dilation of the extrahepatic biliary tree and mild dilation of the central intrahepatic ducts. There is suggestion of a rounded filling defect in the distal common bile duct. There is passage of contrast into the duodenum. IMPRESSION: Suggestion of partially obstructive distal choledocholithiasis. Consider ERCP for further evaluation. Ruthann Cancer, MD [facility] Radiology Electronically Signed   By: Ruthann Cancer MD   On: 08/01/2020 14:16    Anti-infectives: Anti-infectives (From admission, onward)   Start     Dose/Rate Route Frequency Ordered Stop   08/01/20 1000  cefTRIAXone (ROCEPHIN) 2 g in sodium chloride 0.9 % 100 mL IVPB        2 g 200 mL/hr over 30 Minutes Intravenous Every 24 hours 07/31/20 1604     07/31/20 1500  metroNIDAZOLE (FLAGYL) IVPB 500 mg        500 mg 100 mL/hr over 60 Minutes Intravenous  Once 07/31/20 1458 07/31/20 1719   07/31/20 1245  cefTRIAXone (ROCEPHIN) 1 g in sodium chloride 0.9 % 100 mL IVPB        1 g 200 mL/hr over 30 Minutes Intravenous  Once 07/31/20 1238 07/31/20 1359       Assessment/Plan HTN Bipolar disorder Hypothyroidism Peripheral neuropathy IBS --resume home meds-- Hyponatremia - change IVFs hyperglycemia - suspect from D5.  Change IVFs.  Check hgba1c  - just came back at 6.6.  Will have her FU with PCP UTI - Klebsiella on Rocephin and sensitive  POD 1, s/p lap chole for cholecystitis and choledocholithiasis -TB still up at 2.8.  GI following.  Plans for ERCP tomorrow -heart healthy diet today, NPO p MN -cont Rocephin -E coli bacteremia likely secondary to above.  Spoke to ID and will likely treat for a total of 7 days.  FEN - HH,  NPO p MN VTE - SCDs, on hold for ERCP ID - Rocephin 2/14 -->7 days total (can switch to oral at DC)   LOS: 1 day    Henreitta Cea , [facility] Surgery 08/02/2020, 4:08 PM Please see Amion for pager number during day hours 7:00am-4:30pm or 7:00am -11:30am on weekends

## 2020-08-03 ENCOUNTER — Encounter (HOSPITAL_COMMUNITY): Admission: EM | Disposition: A | Discharge status: Home / Self Care | Payer: Self-pay | Source: Ambulatory Visit

## 2020-08-03 ENCOUNTER — Inpatient Hospital Stay (HOSPITAL_COMMUNITY): Payer: PPO

## 2020-08-03 ENCOUNTER — Inpatient Hospital Stay (HOSPITAL_COMMUNITY): Payer: PPO | Admitting: Certified Registered"

## 2020-08-03 ENCOUNTER — Encounter (HOSPITAL_COMMUNITY): Payer: Self-pay

## 2020-08-03 DIAGNOSIS — K819 Cholecystitis, unspecified: Secondary | ICD-10-CM

## 2020-08-03 DIAGNOSIS — K8050 Calculus of bile duct without cholangitis or cholecystitis without obstruction: Secondary | ICD-10-CM

## 2020-08-03 DIAGNOSIS — R8271 Bacteriuria: Secondary | ICD-10-CM

## 2020-08-03 DIAGNOSIS — R932 Abnormal findings on diagnostic imaging of liver and biliary tract: Secondary | ICD-10-CM | POA: Diagnosis not present

## 2020-08-03 DIAGNOSIS — R7881 Bacteremia: Secondary | ICD-10-CM

## 2020-08-03 DIAGNOSIS — R7989 Other specified abnormal findings of blood chemistry: Secondary | ICD-10-CM

## 2020-08-03 DIAGNOSIS — K8051 Calculus of bile duct without cholangitis or cholecystitis with obstruction: Secondary | ICD-10-CM | POA: Diagnosis not present

## 2020-08-03 HISTORY — PX: SPHINCTEROTOMY: SHX5544

## 2020-08-03 HISTORY — PX: REMOVAL OF STONES: SHX5545

## 2020-08-03 HISTORY — PX: ENDOSCOPIC RETROGRADE CHOLANGIOPANCREATOGRAPHY (ERCP) WITH PROPOFOL: SHX5810

## 2020-08-03 HISTORY — PX: BILIARY DILATION: SHX6850

## 2020-08-03 LAB — CULTURE, BLOOD (ROUTINE X 2): Special Requests: ADEQUATE

## 2020-08-03 LAB — COMPREHENSIVE METABOLIC PANEL
ALT: 121 U/L — ABNORMAL HIGH (ref 0–44)
AST: 42 U/L — ABNORMAL HIGH (ref 15–41)
Albumin: 2.7 g/dL — ABNORMAL LOW (ref 3.5–5.0)
Alkaline Phosphatase: 120 U/L (ref 38–126)
Anion gap: 8 (ref 5–15)
BUN: 10 mg/dL (ref 8–23)
CO2: 20 mmol/L — ABNORMAL LOW (ref 22–32)
Calcium: 8.2 mg/dL — ABNORMAL LOW (ref 8.9–10.3)
Chloride: 109 mmol/L (ref 98–111)
Creatinine, Ser: 0.99 mg/dL (ref 0.44–1.00)
GFR, Estimated: 58 mL/min — ABNORMAL LOW (ref >60)
Glucose, Bld: 117 mg/dL — ABNORMAL HIGH (ref 70–99)
Potassium: 4.1 mmol/L (ref 3.5–5.1)
Sodium: 137 mmol/L (ref 135–145)
Total Bilirubin: 1.4 mg/dL — ABNORMAL HIGH (ref 0.3–1.2)
Total Protein: 5.6 g/dL — ABNORMAL LOW (ref 6.5–8.1)

## 2020-08-03 LAB — CBC
HCT: 26.9 % — ABNORMAL LOW (ref 36.0–46.0)
Hemoglobin: 8.6 g/dL — ABNORMAL LOW (ref 12.0–15.0)
MCH: 30.9 pg (ref 26.0–34.0)
MCHC: 32 g/dL (ref 30.0–36.0)
MCV: 96.8 fL (ref 80.0–100.0)
Platelets: 295 10*3/uL (ref 150–400)
RBC: 2.78 MIL/uL — ABNORMAL LOW (ref 3.87–5.11)
RDW: 13.5 % (ref 11.5–15.5)
WBC: 8 10*3/uL (ref 4.0–10.5)
nRBC: 0 % (ref 0.0–0.2)

## 2020-08-03 LAB — GLUCOSE, CAPILLARY
Glucose-Capillary: 89 mg/dL (ref 70–99)
Glucose-Capillary: 131 mg/dL — ABNORMAL HIGH (ref 70–99)
Glucose-Capillary: 290 mg/dL — ABNORMAL HIGH (ref 70–99)
Glucose-Capillary: 247 mg/dL — ABNORMAL HIGH (ref 70–99)

## 2020-08-03 SURGERY — ENDOSCOPIC RETROGRADE CHOLANGIOPANCREATOGRAPHY (ERCP) WITH PROPOFOL
Anesthesia: General

## 2020-08-03 MED ORDER — FENTANYL CITRATE (PF) 250 MCG/5ML IJ SOLN
INTRAMUSCULAR | Status: DC | PRN
  Administered 2020-08-03: 100 ug via INTRAVENOUS

## 2020-08-03 MED ORDER — PHENYLEPHRINE 40 MCG/ML (10ML) SYRINGE FOR IV PUSH (FOR BLOOD PRESSURE SUPPORT)
PREFILLED_SYRINGE | INTRAVENOUS | Status: DC | PRN
  Administered 2020-08-03 (×2): 80 ug via INTRAVENOUS

## 2020-08-03 MED ORDER — DEXAMETHASONE SODIUM PHOSPHATE 10 MG/ML IJ SOLN
INTRAMUSCULAR | Status: DC | PRN
  Administered 2020-08-03: 5 mg via INTRAVENOUS

## 2020-08-03 MED ORDER — INDOMETHACIN 50 MG RE SUPP
RECTAL | Status: DC | PRN
  Administered 2020-08-03: 100 mg via RECTAL

## 2020-08-03 MED ORDER — INDOMETHACIN 50 MG RE SUPP
RECTAL | Status: AC
  Filled 2020-08-03: qty 2

## 2020-08-03 MED ORDER — SUGAMMADEX SODIUM 200 MG/2ML IV SOLN
INTRAVENOUS | Status: DC | PRN
  Administered 2020-08-03: 150 mg via INTRAVENOUS

## 2020-08-03 MED ORDER — LACTATED RINGERS IV SOLN: Freq: Once | INTRAVENOUS | Status: AC

## 2020-08-03 MED ORDER — GLUCAGON HCL RDNA (DIAGNOSTIC) 1 MG IJ SOLR
INTRAMUSCULAR | Status: DC | PRN
  Administered 2020-08-03 (×3): .25 mg via INTRAVENOUS

## 2020-08-03 MED ORDER — SODIUM CHLORIDE 0.9 % IV SOLN
INTRAVENOUS | Status: DC | PRN
  Administered 2020-08-03: 40 mL

## 2020-08-03 MED ORDER — LIDOCAINE 2% (20 MG/ML) 5 ML SYRINGE
INTRAMUSCULAR | Status: DC | PRN
  Administered 2020-08-03: 100 mg via INTRAVENOUS

## 2020-08-03 MED ORDER — ROCURONIUM BROMIDE 10 MG/ML (PF) SYRINGE
PREFILLED_SYRINGE | INTRAVENOUS | Status: DC | PRN
  Administered 2020-08-03: 70 mg via INTRAVENOUS

## 2020-08-03 MED ORDER — ONDANSETRON HCL 4 MG/2ML IJ SOLN
INTRAMUSCULAR | Status: DC | PRN
  Administered 2020-08-03: 4 mg via INTRAVENOUS

## 2020-08-03 MED ORDER — AMOXICILLIN-POT CLAVULANATE 875-125 MG PO TABS
1.0000 | ORAL_TABLET | Freq: Two times a day (BID) | ORAL | Status: DC
  Administered 2020-08-04: 1 via ORAL
  Filled 2020-08-03: qty 1

## 2020-08-03 MED ORDER — GLUCAGON HCL RDNA (DIAGNOSTIC) 1 MG IJ SOLR
INTRAMUSCULAR | Status: AC
  Filled 2020-08-03: qty 1

## 2020-08-03 MED ORDER — PHENYLEPHRINE HCL-NACL 10-0.9 MG/250ML-% IV SOLN
INTRAVENOUS | Status: DC | PRN
  Administered 2020-08-03: 25 ug/min via INTRAVENOUS

## 2020-08-03 MED ORDER — PROPOFOL 10 MG/ML IV BOLUS
INTRAVENOUS | Status: DC | PRN
  Administered 2020-08-03: 130 mg via INTRAVENOUS

## 2020-08-03 MED ORDER — LACTATED RINGERS IV SOLN: INTRAVENOUS | Status: DC | PRN

## 2020-08-03 NOTE — Interval H&P Note (Signed)
History and Physical Interval Note:  08/03/2020 9:14 AM  Theresa Underwood  has presented today for surgery, with the diagnosis of CBD stones on intraoperative cholangiogram 2/14..  The various methods of treatment have been discussed with the patient and family. After consideration of risks, benefits and other options for treatment, the patient has consented to  Procedure(s): ENDOSCOPIC RETROGRADE CHOLANGIOPANCREATOGRAPHY (ERCP) WITH PROPOFOL (N/A) as a surgical intervention.  [name] history has been reviewed, patient examined, no change in status, stable for surgery.  I have reviewed the patient's chart and labs.  Questions were answered to the patient's satisfaction.     Theresa Underwood. [place] Plan

## 2020-08-03 NOTE — Consult Note (Addendum)
[facility]  Total days of antibiotics 4         Reason for Consult: Antibiotic Stewardship consultation for e.coli bacteremia    Referring Physician: CCS  Active Problems:   Cholelithiasis   Bile duct stone   Elevated LFTs   Abnormal cholangiogram    HPI: Theresa Underwood is a 80 y.o. female with history of diverticulosis,ibs, htn, bipolar disorder and choleliathisis who was admitted for increasing intermittent abdominal pain with eating. Initially attributed to IBS however, on day of admit had severe epigastric pain with H/V. On admit found to have temp of 101F, leukocytosis as well as transaminitis. abd CT found biliary obstruction with dilated CBD and gallbladder wall thickening. Infectious work up revealed blood cx showing ecoli bacteremia-likely secondary from cholecystitis and choledocolithiasis. She underwent lap chole by dr Rosendo Gros on 2/15 but intraoperative imaging still showed evidence of stone still in CBD. She underwent successful stone extraction with ERCP today. She is day 4 of ceftriaxone. Leukocytosis improved from 12.7 down to 8. She was slow to recover from anesthesia this afternoon. Has alittle bit of bloating but not significant abdominal pain from her surgery or procedure. She did not recall having any dysuria on admission   Past Medical History:  Diagnosis Date  . Anemia   . Arthritis   . Bipolar disorder ([place])   . Burning pain    where bladder pushes thru vaginal wall  . Cholelithiasis   . Colon polyps   . Complication of anesthesia    slight trouble turning head side to side  . Contusion of right great toe with damage to nail    right toenail black for months  . Cystocele   . DDD (degenerative disc disease)   . Depression   . Diverticulosis   . Ganglion cyst of right foot    due to bone spurs  . GERD (gastroesophageal reflux disease)   . Glucose intolerance (impaired glucose tolerance)   . Hemorrhoids   . History of bladder  infections   . History of TMJ disorder    no problems in years, wears bite guard when tmj occurs  . HOH (hard of hearing)    left more than right, mild  . Hyperlipidemia   . Hypertension   . Hypothyroidism   . Peripheral neuropathy    bilateral feet, Dr. Posey Pronto  . Prolapsed bladder   . Spinal stenosis   . Tinnitus    left ear drum beat    Allergies:  Allergies  Allergen Reactions  . Sulfa Antibiotics Hives  . Carbamazepine     REACTION: hives with tegretol  . Sulfonamide Derivatives     REACTION: hives    MEDICATIONS: . acetaminophen  1,000 mg Oral Q6H  . clonazePAM  0.5 mg Oral QHS  . gabapentin  600 mg Oral TID  . insulin aspart  0-15 Units Subcutaneous TID WC  . insulin aspart  0-5 Units Subcutaneous QHS  . lamoTRIgine  200 mg Oral Daily  . levothyroxine  112 mcg Oral QAC breakfast  . lisinopril  10 mg Oral Once per day on Mon Wed Fri    Social History   Tobacco Use  . Smoking status: Never Smoker  . Smokeless tobacco: Never Used  Vaping Use  . Vaping Use: Never used  Substance Use Topics  . Alcohol use: Yes    Alcohol/week: 0.0 standard drinks    Comment: occ.  . Drug use: No    Family  History  Problem Relation Age of Onset  . Bipolar disorder Mother   . Osteoporosis Mother   . Colon polyps Mother   . Stroke Mother        Deceased, 61  . Neuropathy Mother   . Peripheral vascular disease Maternal Grandmother   . Pancreatic cancer Maternal Grandmother   . Colon polyps Father   . Lung disease Father        Deceased, 40  . Diverticulitis Sister   . Neuropathy Maternal Grandfather   . Colon cancer Neg Hx   . Breast cancer Neg Hx      Review of Systems  Constitutional: Negative for fever, chills, diaphoresis, activity change, appetite change, fatigue and unexpected weight change.  HENT: Negative for congestion, sore throat, rhinorrhea, sneezing, trouble swallowing and sinus pressure.  Eyes: Negative for photophobia and visual disturbance.   Respiratory: Negative for cough, chest tightness, shortness of breath, wheezing and stridor.  Cardiovascular: Negative for chest pain, palpitations and leg swelling.  Gastrointestinal: + loose stools. Negative for nausea, vomiting, abdominal pain, diarrhea, constipation, blood in stool, abdominal distention and anal bleeding.  Genitourinary: Negative for dysuria, hematuria, flank pain and difficulty urinating.  Musculoskeletal: Negative for myalgias, back pain, joint swelling, arthralgias and gait problem.  Skin: Negative for color change, pallor, rash and wound.  Neurological: Negative for dizziness, tremors, weakness and light-headedness.  Hematological: Negative for adenopathy. Does not bruise/bleed easily.  Psychiatric/Behavioral: Negative for behavioral problems, confusion, sleep disturbance, dysphoric mood, decreased concentration and agitation.     OBJECTIVE: Temp:  [97.5 F (36.4 C)-98.7 F (37.1 C)] 98 F (36.7 C) (02/17 1348) Pulse Rate:  [56-72] 57 (02/17 1348) Resp:  [16-20] 16 (02/17 1050) BP: (110-159)/(59-80) 135/73 (02/17 1348) SpO2:  [96 %-100 %] 96 % (02/17 1348) Physical Exam  Constitutional:  oriented to person, place, and time. appears well-developed and well-nourished. No distress.  HENT: [place]/AT, PERRLA, no scleral icterus Mouth/Throat: Oropharynx is clear and moist. No oropharyngeal exudate.  Cardiovascular: Normal rate, regular rhythm and normal heart sounds. Soft systolic murmur.Exam reveals no gallop and no friction rub.  Pulmonary/Chest: Effort normal and breath sounds normal. No respiratory distress.  has no wheezes.  Neck = supple, no nuchal rigidity Abdominal: Soft. Bowel sounds are normal.  exhibits no distension. There is no tenderness. Port incisions are glued no surrounding erythema. Has AK lesions on abdomen Lymphadenopathy: no cervical adenopathy. No axillary adenopathy Neurological: alert and oriented to person, place, and time.  Skin: Skin is warm  and dry. No rash noted. No erythema.  Psychiatric: a normal mood and affect.  behavior is normal.   LABS: Results for orders placed or performed during the hospital encounter of 07/31/20 (from the past 48 hour(s))  CBC     Status: Abnormal   Collection Time: 08/02/20  2:35 AM  Result Value Ref Range   WBC 12.4 (H) 4.0 - 10.5 K/uL   RBC 2.76 (L) 3.87 - 5.11 MIL/uL   Hemoglobin 8.6 (L) 12.0 - 15.0 g/dL   HCT 26.5 (L) 36.0 - 46.0 %   MCV 96.0 80.0 - 100.0 fL   MCH 31.2 26.0 - 34.0 pg   MCHC 32.5 30.0 - 36.0 g/dL   RDW 13.0 11.5 - 15.5 %   Platelets 284 150 - 400 K/uL   nRBC 0.0 0.0 - 0.2 %    Comment: Performed at [facility], [place] [address] [place], [place] [address]  Comprehensive metabolic panel     Status: Abnormal  Collection Time: 08/02/20  2:35 AM  Result Value Ref Range   Sodium 131 (L) 135 - 145 mmol/L   Potassium 4.2 3.5 - 5.1 mmol/L   Chloride 102 98 - 111 mmol/L   CO2 20 (L) 22 - 32 mmol/L   Glucose, Bld 325 (H) 70 - 99 mg/dL    Comment: Glucose reference range applies only to samples taken after fasting for at least 8 hours.   BUN 12 8 - 23 mg/dL   Creatinine, Ser 0.98 0.44 - 1.00 mg/dL   Calcium 7.9 (L) 8.9 - 10.3 mg/dL   Total Protein 5.7 (L) 6.5 - 8.1 g/dL   Albumin 2.8 (L) 3.5 - 5.0 g/dL   AST 114 (H) 15 - 41 U/L   ALT 190 (H) 0 - 44 U/L   Alkaline Phosphatase 117 38 - 126 U/L   Total Bilirubin 2.8 (H) 0.3 - 1.2 mg/dL   GFR, Estimated 59 (L) >60 mL/min    Comment: (NOTE) Calculated using the CKD-EPI Creatinine Equation (2021)    Anion gap 9 5 - 15    Comment: Performed at [facility], [place] [address], [place], [place] [address]  Hemoglobin A1c     Status: Abnormal   Collection Time: 08/02/20  2:00 PM  Result Value Ref Range   Hgb A1c MFr Bld 6.6 (H) 4.8 - 5.6 %    Comment: (NOTE) Pre diabetes:          5.7%-6.4%  Diabetes:              >6.4%  Glycemic control for   <7.0% adults with diabetes    Mean Plasma Glucose 142.72 mg/dL     Comment: Performed at [facility] [address], [place], [place] [address]  Glucose, capillary     Status: Abnormal   Collection Time: 08/02/20  5:28 PM  Result Value Ref Range   Glucose-Capillary 140 (H) 70 - 99 mg/dL    Comment: Glucose reference range applies only to samples taken after fasting for at least 8 hours.  Glucose, capillary     Status: Abnormal   Collection Time: 08/02/20  8:38 PM  Result Value Ref Range   Glucose-Capillary 120 (H) 70 - 99 mg/dL    Comment: Glucose reference range applies only to samples taken after fasting for at least 8 hours.  CBC     Status: Abnormal   Collection Time: 08/03/20  5:55 AM  Result Value Ref Range   WBC 8.0 4.0 - 10.5 K/uL   RBC 2.78 (L) 3.87 - 5.11 MIL/uL   Hemoglobin 8.6 (L) 12.0 - 15.0 g/dL   HCT 26.9 (L) 36.0 - 46.0 %   MCV 96.8 80.0 - 100.0 fL   MCH 30.9 26.0 - 34.0 pg   MCHC 32.0 30.0 - 36.0 g/dL   RDW 13.5 11.5 - 15.5 %   Platelets 295 150 - 400 K/uL   nRBC 0.0 0.0 - 0.2 %    Comment: Performed at [facility], [place] [address] [place], [place] [address]  Comprehensive metabolic panel     Status: Abnormal   Collection Time: 08/03/20  5:55 AM  Result Value Ref Range   Sodium 137 135 - 145 mmol/L   Potassium 4.1 3.5 - 5.1 mmol/L   Chloride 109 98 - 111 mmol/L   CO2 20 (L) 22 - 32 mmol/L   Glucose, Bld 117 (H) 70 - 99 mg/dL    Comment: Glucose reference range applies  only to samples taken after fasting for at least 8 hours.   BUN 10 8 - 23 mg/dL   Creatinine, Ser 0.99 0.44 - 1.00 mg/dL   Calcium 8.2 (L) 8.9 - 10.3 mg/dL   Total Protein 5.6 (L) 6.5 - 8.1 g/dL   Albumin 2.7 (L) 3.5 - 5.0 g/dL   AST 42 (H) 15 - 41 U/L   ALT 121 (H) 0 - 44 U/L   Alkaline Phosphatase 120 38 - 126 U/L   Total Bilirubin 1.4 (H) 0.3 - 1.2 mg/dL   GFR, Estimated 58 (L) >60 mL/min    Comment: (NOTE) Calculated using the CKD-EPI Creatinine Equation (2021)    Anion gap 8 5 - 15    Comment: Performed at [facility],  [place] [address] [place], [place] [address]  Glucose, capillary     Status: None   Collection Time: 08/03/20  7:36 AM  Result Value Ref Range   Glucose-Capillary 89 70 - 99 mg/dL    Comment: Glucose reference range applies only to samples taken after fasting for at least 8 hours.  Glucose, capillary     Status: Abnormal   Collection Time: 08/03/20 11:41 AM  Result Value Ref Range   Glucose-Capillary 131 (H) 70 - 99 mg/dL    Comment: Glucose reference range applies only to samples taken after fasting for at least 8 hours.    MICRO: Escherichia coli      MIC    AMPICILLIN 8 SENSITIVE  Sensitive    AMPICILLIN/SULBACTAM 4 SENSITIVE  Sensitive    CEFAZOLIN <=4 SENSITIVE  Sensitive    CEFEPIME <=0.12 SENS... Sensitive    CEFTAZIDIME <=1 SENSITIVE  Sensitive    CEFTRIAXONE <=0.25 SENS... Sensitive    CIPROFLOXACIN <=0.25 SENS... Sensitive    GENTAMICIN <=1 SENSITIVE  Sensitive    IMIPENEM <=0.25 SENS... Sensitive    PIP/TAZO <=4 SENSITIVE  Sensitive    TRIMETH/SULFA <=20 SENSIT... Sensitive     IMAGING: DG ERCP BILIARY & PANCREATIC DUCTS  Result Date: 08/03/2020 CLINICAL DATA:  80 year old female status post laparoscopic cholecystectomy EXAM: ERCP TECHNIQUE: Multiple spot images obtained with the fluoroscopic device and submitted for interpretation post-procedure. FLUOROSCOPY TIME:  Fluoroscopy Time:  3 minutes 54 seconds COMPARISON:  CT 07/31/2020, intraoperative images [id number] FINDINGS: Limited intraoperative fluoroscopic spot images during ERCP. Initial image demonstrates endoscope projecting over the upper abdomen. Subsequently there is cannulation of the ampulla and retrograde infusion of contrast into the extrahepatic biliary system. Deployment of a retrieval balloon. IMPRESSION: Limited images during ERCP demonstrates deployment of a retrieval balloon for treatment of choledocholithiasis. Please refer to the dictated operative report for full details of intraoperative findings and procedure.  Electronically Signed   By: Corrie Mckusick D.O.   On: 08/03/2020 13:13   Assessment/Plan:  79yo F with cholecystitis and choledocolithaisis with secondary e.coli bacteremia s/p lap chole and ERCP. Also found to have asymptomatic bacturia  Cholecystitis with E.coli bacteremia = recommend to treat with 5 addn days of antibiotics. Recommend to treat with amox/clav 875mg  BID starting tomorrow to complete 7 day course of treatment since source control.  Asymptomatic bacturia = not completely clear if she was symptomatic. Has been on ceftriaxone which would treat uti.  ibs = mentioned to patient that gallbladder removal can also cause loose stools. To follow up with dr Henrene Pastor her gi doc.  [facility] [phone]

## 2020-08-03 NOTE — Transfer of Care (Signed)
Immediate Anesthesia Transfer of Care Note  Patient: Theresa Underwood  Procedure(s) Performed: ENDOSCOPIC RETROGRADE CHOLANGIOPANCREATOGRAPHY (ERCP) WITH PROPOFOL (N/A ) SPHINCTEROTOMY REMOVAL OF STONES BILIARY DILATION  Patient Location: Endoscopy Unit  Anesthesia Type:General  Level of Consciousness: awake, alert  and oriented  Airway & Oxygen Therapy: Patient Spontanous Breathing and Patient connected to nasal cannula oxygen  Post-op Assessment: Report given to RN, Post -op Vital signs reviewed and stable and Patient moving all extremities X 4  Post vital signs: Reviewed and stable  Last Vitals:  Vitals Value Taken Time  BP 149/67 08/03/20 1040  Temp    Pulse 72 08/03/20 1042  Resp 17 08/03/20 1042  SpO2 100 % 08/03/20 1042  Vitals shown include unvalidated device data.  Last Pain:  Vitals:   08/03/20 0907  TempSrc: Oral  PainSc: 0-No pain      Patients Stated Pain Goal: 3 (62/94/76 5465)  Complications: No complications documented.

## 2020-08-03 NOTE — Progress Notes (Signed)
2 Days Post-Op  Subjective: No new complaints. Having mild abdominal soreness, worse on the right, since surgery. Denies nausea or emesis. + non-bloody BM yesterday. Going for ERCP today.  ROS: See above, otherwise other systems negative  Objective: Vital signs in last 24 hours: Temp:  [97.6 F (36.4 C)-98.3 F (36.8 C)] 98 F (36.7 C) (02/17 0545) Pulse Rate:  [66-72] 66 (02/17 0545) Resp:  [16-18] 17 (02/17 0545) BP: (110-124)/(59-65) 122/60 (02/17 0545) SpO2:  [97 %-99 %] 97 % (02/17 0545) Last BM Date: 08/02/20  Intake/Output from previous day: 02/16 0701 - 02/17 0700 In: 1756.2 [P.O.:240; I.V.:1316.2; IV Piggyback:200] Out: 2 [Urine:2] Intake/Output this shift: No intake/output data recorded.  PE: Abd: soft, appropriately tender, +BS, ND, incisions c/d/i, chronic rash over abdominal wall present prior to admission.  Lab Results:  Recent Labs    08/02/20 0235 08/03/20 0555  WBC 12.4* 8.0  HGB 8.6* 8.6*  HCT 26.5* 26.9*  PLT 284 295   BMET Recent Labs    08/02/20 0235 08/03/20 0555  NA 131* 137  K 4.2 4.1  CL 102 109  CO2 20* 20*  GLUCOSE 325* 117*  BUN 12 10  CREATININE 0.98 0.99  CALCIUM 7.9* 8.2*   PT/INR No results for input(s): LABPROT, INR in the last 72 hours. CMP     Component Value Date/Time   NA 137 08/03/2020 0555   K 4.1 08/03/2020 0555   CL 109 08/03/2020 0555   CO2 20 (L) 08/03/2020 0555   GLUCOSE 117 (H) 08/03/2020 0555   BUN 10 08/03/2020 0555   CREATININE 0.99 08/03/2020 0555   CALCIUM 8.2 (L) 08/03/2020 0555   PROT 5.6 (L) 08/03/2020 0555   ALBUMIN 2.7 (L) 08/03/2020 0555   AST 42 (H) 08/03/2020 0555   ALT 121 (H) 08/03/2020 0555   ALKPHOS 120 08/03/2020 0555   BILITOT 1.4 (H) 08/03/2020 0555   GFRNONAA 58 (L) 08/03/2020 0555   GFRAA >60 12/06/2015 0439   Lipase     Component Value Date/Time   LIPASE 100 (H) 07/31/2020 1115       Studies/Results: DG Cholangiogram Operative  Result Date:  08/01/2020 CLINICAL DATA:  80 year old female undergoing laparoscopic cholecystectomy. EXAM: INTRAOPERATIVE CHOLANGIOGRAM COMPARISON:  CT abdomen pelvis from 07/31/2020 FLUOROSCOPY TIME:  Fluoroscopy Time:  38 seconds Radiation Exposure Index (if provided by the fluoroscopic device): 13.1 mGy Number of Acquired Spot Images: 0 FINDINGS: Intraoperative cholangiogram with injection via the cystic duct demonstrates moderate dilation of the extrahepatic biliary tree and mild dilation of the central intrahepatic ducts. There is suggestion of a rounded filling defect in the distal common bile duct. There is passage of contrast into the duodenum. IMPRESSION: Suggestion of partially obstructive distal choledocholithiasis. Consider ERCP for further evaluation. Ruthann Cancer, MD [facility] Electronically Signed   By: Ruthann Cancer MD   On: 08/01/2020 14:16    Anti-infectives: Anti-infectives (From admission, onward)   Start     Dose/Rate Route Frequency Ordered Stop   08/01/20 1000  cefTRIAXone (ROCEPHIN) 2 g in sodium chloride 0.9 % 100 mL IVPB        2 g 200 mL/hr over 30 Minutes Intravenous Every 24 hours 07/31/20 1604     07/31/20 1500  metroNIDAZOLE (FLAGYL) IVPB 500 mg        500 mg 100 mL/hr over 60 Minutes Intravenous  Once 07/31/20 1458 07/31/20 1719   07/31/20 1245  cefTRIAXone (ROCEPHIN) 1 g in sodium chloride 0.9 %  100 mL IVPB        1 g 200 mL/hr over 30 Minutes Intravenous  Once 07/31/20 1238 07/31/20 1359       Assessment/Plan HTN Bipolar disorder Hypothyroidism Peripheral neuropathy IBS --resume home meds-- Hyponatremia - IVF switched to NS w/ KCl >>resolved, Na 137 this AM hyperglycemia - Check hgba1c  - just came back at 6.6.  Will have her FU with PCP UTI - Klebsiella on Rocephin and sensitive  POD 2  s/p lap chole for cholecystitis and choledocholithiasis 2/15 -TB 1.4 from 2.8.  GI following.  Plans for ERCP today -  NPO for procedure, then HH  -cont Rocephin -E coli bacteremia likely secondary to above.  Spoke to ID and will likely treat for a total of 7 days. sensitivities pending  FEN - HH, NPO p MN VTE - SCDs, on hold for ERCP ID - Rocephin 2/14 -->7 days total (can switch to oral at DC)   LOS: 2 days    Jill Alexanders , [facility] 08/03/2020, 7:21 AM Please see Amion for pager number during day hours 7:00am-4:30pm or 7:00am -11:30am on weekends

## 2020-08-03 NOTE — Anesthesia Postprocedure Evaluation (Signed)
Anesthesia Post Note  Patient: JESSELLE LAFLAMME  Procedure(s) Performed: ENDOSCOPIC RETROGRADE CHOLANGIOPANCREATOGRAPHY (ERCP) WITH PROPOFOL (N/A ) SPHINCTEROTOMY REMOVAL OF STONES BILIARY DILATION     Patient location during evaluation: PACU Anesthesia Type: General Level of consciousness: sedated Pain management: pain level controlled Vital Signs Assessment: post-procedure vital signs reviewed and stable Respiratory status: spontaneous breathing and respiratory function stable Cardiovascular status: stable Postop Assessment: no apparent nausea or vomiting Anesthetic complications: no   No complications documented.  Last Vitals:  Vitals:   08/03/20 1347 08/03/20 1348  BP: 135/73 135/73  Pulse: (!) 56 (!) 57  Resp:    Temp: 36.7 C 36.7 C  SpO2: 97% 96%    Last Pain:  Vitals:   08/03/20 1348  TempSrc: Oral  PainSc:                  Chibueze Beasley DANIEL

## 2020-08-03 NOTE — Op Note (Signed)
[facility] Patient Name: Theresa Underwood Procedure Date : 08/03/2020 MRN: [id number] Attending MD: Ladene Artist , MD Date of Birth: 1940/11/06 CSN: [id number] Age: 80 Admit Type: Inpatient Procedure:                ERCP Indications:              Bile duct stone(s), Filling defect on                            intraoperative cholangiogram, Elevated liver enzymes Providers:                Pricilla Riffle. Fuller Plan, MD, Elmer Ramp. Tilden Dome, RN, Benetta Spar, Technician, Phill Myron. Proofreader, CRNA Referring MD:             Ralene Ok, MD Medicines:                General Anesthesia Complications:            No immediate complications. Estimated Blood Loss:     Estimated blood loss: none. Procedure:                Pre-Anesthesia Assessment:                           - Prior to the procedure, a History and Physical                            was performed, and patient medications and                            allergies were reviewed. [name] tolerance of                            previous anesthesia was also reviewed. The risks                            and benefits of the procedure and the sedation                            options and risks were discussed with the patient.                            All questions were answered, and informed consent                            was obtained. Prior Anticoagulants: The patient has                            taken no previous anticoagulant or antiplatelet                            agents. ASA Grade Assessment: II - A patient with  mild systemic disease. After reviewing the risks                            and benefits, the patient was deemed in                            satisfactory condition to undergo the procedure.                           After obtaining informed consent, the scope was                            passed under direct vision. Throughout the                             procedure, the patient's blood pressure, pulse, and                            oxygen saturations were monitored continuously. The                            TJF-Q180V ([id number] Olympus duodenoscope was                            introduced through the mouth, and used to inject                            contrast into and used to inject contrast into the                            bile duct. The ERCP was accomplished without                            difficulty. The patient tolerated the procedure                            well. Scope In: Scope Out: Findings:      The scout film was normal. The esophagus was successfully intubated       under direct vision. The scope was advanced to a normal major papilla in       the descending duodenum without detailed examination of the pharynx,       larynx and associated structures, and upper GI tract. The upper GI tract       was grossly normal except for a small periampullary diverticulum. A       straight Roadrunner wire was passed into the biliary tree. The       short-nosed traction sphincterotome was passed over the guidewire and       the bile duct was then deeply cannulated. Contrast was injected. I       personally interpreted the bile duct images. Ductal flow of contrast was       adequate. The common bile duct, CHD and intrahepatic ducts were       moderately dilated, with a stone causing an obstruction. The cystic duct       stump was  slightly dilated and otherwise normal. The largest CBD       diameter was 12 mm. A cholecystectomy had been performed. The common       bile duct contained one stone, which was 10 mm in diameter. An 8 mm       biliary sphincterotomy was made with a traction (standard)       sphincterotome using ERBE electrocautery. Unable to extend beyond 8 mm       as the cut was getting very close to the diverticulum. There was no       post-sphincterotomy bleeding. Several attempts at balloon stone        extraction were not successful. Dilation of major papilla,       sphincteroplasty, with an 01-23-09 mm balloon (to a maximum balloon size       of 10 mm) dilator was successful. The biliary tree was swept with a 12       mm balloon starting at the bifurcation. One stone was removed. No stones       remained. Excellent biliary drainage was noted. The PD was not       cannulated or injected by intention. Impression:               - The biliary tree was moderately diffusely                            dilated, with a stone causing an obstruction.                           - Prior cholecystectomy.                           - Choledocholithiasis was found. Complete removal                            was accomplished by biliary sphincterotomy and                            balloon extraction.                           - A biliary sphincterotomy was performed.                           - Major papilla was successfully dilated,                            successful sphincteroplasty.                           - The biliary tree was swept. Recommendation:           - Avoid aspirin and nonsteroidal anti-inflammatory                            medicines for 1 week.                           - Return patient to hospital ward for ongoing care.                           -  Observe patient's clinical course following                            today's ERCP with therapeutic intervention.                           - Trend LFTs. Procedure Code(s):        --- Professional ---                           [phone], 33, Endoscopic retrograde                            cholangiopancreatography (ERCP); with                            trans-endoscopic balloon dilation of                            biliary/pancreatic duct(s) or of ampulla                            (sphincteroplasty), including sphincterotomy, when                            performed, each duct                           43264, Endoscopic retrograde                             cholangiopancreatography (ERCP); with removal of                            calculi/debris from biliary/pancreatic duct(s) Diagnosis Code(s):        --- Professional ---                           K80.51, Calculus of bile duct without cholangitis                            or cholecystitis with obstruction                           Z90.49, Acquired absence of other specified parts                            of digestive tract                           R74.8, Abnormal levels of other serum enzymes                           R93.2, Abnormal findings on diagnostic imaging of                            liver and biliary tract CPT copyright 2019 American Medical Association. All rights reserved.  The codes documented in this report are preliminary and upon coder review may  be revised to meet current compliance requirements. Ladene Artist, MD 08/03/2020 10:49:48 AM This report has been signed electronically. Number of Addenda: 0

## 2020-08-03 NOTE — Anesthesia Procedure Notes (Signed)
Procedure Name: Intubation Date/Time: 08/03/2020 9:33 AM Performed by: Gaylene Brooks, CRNA Pre-anesthesia Checklist: Patient identified, Emergency Drugs available, Suction available and Patient being monitored Patient Re-evaluated:Patient Re-evaluated prior to induction Oxygen Delivery Method: Circle System Utilized Preoxygenation: Pre-oxygenation with 100% oxygen Induction Type: IV induction Ventilation: Mask ventilation without difficulty Laryngoscope Size: Miller and 2 Grade View: Grade I Tube type: Oral Tube size: 7.0 mm Number of attempts: 1 Airway Equipment and Method: Stylet and Oral airway Placement Confirmation: ETT inserted through vocal cords under direct vision,  positive ETCO2 and breath sounds checked- equal and bilateral Secured at: 21 cm Tube secured with: Tape Dental Injury: Teeth and Oropharynx as per pre-operative assessment

## 2020-08-03 NOTE — Anesthesia Preprocedure Evaluation (Addendum)
Anesthesia Evaluation  Patient identified by MRN, date of birth, ID band Patient awake    Reviewed: Allergy & Precautions, NPO status , Patient's Chart, lab work & pertinent test results  History of Anesthesia Complications Negative for: history of anesthetic complications  Airway Mallampati: II  TM Distance: >3 FB Neck ROM: Full    Dental  (+) Caps, Dental Advisory Given   Pulmonary neg pulmonary ROS,    Pulmonary exam normal        Cardiovascular hypertension, Pt. on medications Normal cardiovascular exam     Neuro/Psych PSYCHIATRIC DISORDERS Depression Bipolar Disorder  Neuromuscular disease (cervical radiculopathy)    GI/Hepatic GERD  ,  Endo/Other  Hypothyroidism hyperlipidemia  Renal/GU   negative genitourinary   Musculoskeletal  (+) Arthritis ,   Abdominal   Peds negative pediatric ROS (+)  Hematology  (+) anemia ,   Anesthesia Other Findings TMJ dysfunction  Reproductive/Obstetrics                            Anesthesia Physical  Anesthesia Plan  ASA: III  Anesthesia Plan: General   Post-op Pain Management:    Induction: Intravenous and Rapid sequence  PONV Risk Score and Plan: 3 and Ondansetron, Dexamethasone and Treatment may vary due to age or medical condition  Airway Management Planned: Oral ETT  Additional Equipment: None  Intra-op Plan:   Post-operative Plan: Extubation in OR  Informed Consent: I have reviewed the patients History and Physical, chart, labs and discussed the procedure including the risks, benefits and alternatives for the proposed anesthesia with the patient or authorized representative who has indicated his/her understanding and acceptance.   Patient has DNR.  Discussed DNR with patient and Suspend DNR.   Dental advisory given  Plan Discussed with: Anesthesiologist  Anesthesia Plan Comments: ( Pt would like to suspend DNR during  procedure except for chest compressions.)       Anesthesia Quick Evaluation

## 2020-08-04 ENCOUNTER — Other Ambulatory Visit: Payer: Self-pay | Admitting: General Surgery

## 2020-08-04 DIAGNOSIS — K8051 Calculus of bile duct without cholangitis or cholecystitis with obstruction: Secondary | ICD-10-CM | POA: Diagnosis not present

## 2020-08-04 DIAGNOSIS — R7989 Other specified abnormal findings of blood chemistry: Secondary | ICD-10-CM | POA: Diagnosis not present

## 2020-08-04 DIAGNOSIS — R932 Abnormal findings on diagnostic imaging of liver and biliary tract: Secondary | ICD-10-CM | POA: Diagnosis not present

## 2020-08-04 LAB — COMPREHENSIVE METABOLIC PANEL
ALT: 91 U/L — ABNORMAL HIGH (ref 0–44)
AST: 26 U/L (ref 15–41)
Albumin: 2.6 g/dL — ABNORMAL LOW (ref 3.5–5.0)
Alkaline Phosphatase: 123 U/L (ref 38–126)
Anion gap: 7 (ref 5–15)
BUN: 13 mg/dL (ref 8–23)
CO2: 18 mmol/L — ABNORMAL LOW (ref 22–32)
Calcium: 7.9 mg/dL — ABNORMAL LOW (ref 8.9–10.3)
Chloride: 108 mmol/L (ref 98–111)
Creatinine, Ser: 0.97 mg/dL (ref 0.44–1.00)
GFR, Estimated: 60 mL/min — ABNORMAL LOW (ref >60)
Glucose, Bld: 202 mg/dL — ABNORMAL HIGH (ref 70–99)
Potassium: 4.9 mmol/L (ref 3.5–5.1)
Sodium: 133 mmol/L — ABNORMAL LOW (ref 135–145)
Total Bilirubin: 1 mg/dL (ref 0.3–1.2)
Total Protein: 5.6 g/dL — ABNORMAL LOW (ref 6.5–8.1)

## 2020-08-04 LAB — LIPASE, BLOOD: Lipase: 22 U/L (ref 11–51)

## 2020-08-04 LAB — GLUCOSE, CAPILLARY: Glucose-Capillary: 118 mg/dL — ABNORMAL HIGH (ref 70–99)

## 2020-08-04 MED ORDER — ACETAMINOPHEN 500 MG PO TABS: 1000.0000 mg | ORAL_TABLET | Freq: Four times a day (QID) | ORAL | 0 refills | Status: DC

## 2020-08-04 MED ORDER — TRAMADOL HCL 50 MG PO TABS: 50.0000 mg | ORAL_TABLET | Freq: Four times a day (QID) | ORAL | 0 refills | Status: DC | PRN

## 2020-08-04 MED ORDER — AMOXICILLIN-POT CLAVULANATE 875-125 MG PO TABS: 1.0000 | ORAL_TABLET | Freq: Two times a day (BID) | ORAL | 0 refills | Status: AC

## 2020-08-04 MED FILL — traMADol HCL 50 MG TABS: 50 | 3 days supply | Qty: 15 | Fill #0

## 2020-08-04 MED FILL — AMOX-CLAV 875-125 MG TABLET: 875-125 | 7 days supply | Qty: 14 | Fill #0

## 2020-08-04 NOTE — Care Management Important Message (Signed)
Important Message  Patient Details  Name: Theresa Underwood MRN: [id number] Date of Birth: Oct 29, 1940   Medicare Important Message Given:  Yes     Orbie Pyo 08/04/2020, 2:07 PM

## 2020-08-04 NOTE — Discharge Instructions (Signed)
[facility] LAPAROSCOPIC SURGERY: POST OP INSTRUCTIONS Always review your discharge instruction sheet given to you by the facility where your surgery was performed. IF YOU HAVE DISABILITY OR FAMILY LEAVE FORMS, YOU MUST BRING THEM TO THE OFFICE FOR PROCESSING.   DO NOT GIVE THEM TO YOUR DOCTOR.  PAIN CONTROL  1. First take acetaminophen (Tylenol) AND/or ibuprofen (Advil) to control your pain after surgery.  Follow directions on package.  Taking acetaminophen (Tylenol) and/or ibuprofen (Advil) regularly after surgery will help to control your pain and lower the amount of prescription pain medication you may need.  You should not take more than 3,000 mg (3 grams) of acetaminophen (Tylenol) in 24 hours.  You should not take ibuprofen (Advil), aleve, motrin, naprosyn or other NSAIDS if you have a history of stomach ulcers or chronic kidney disease.  2. A prescription for pain medication may be given to you upon discharge.  Take your pain medication as prescribed, if you still have uncontrolled pain after taking acetaminophen (Tylenol) or ibuprofen (Advil). 3. Use ice packs to help control pain. 4. If you need a refill on your pain medication, please contact your pharmacy.  They will contact our office to request authorization. Prescriptions will not be filled after 5pm or on week-ends.  HOME MEDICATIONS 5. Take your usually prescribed medications unless otherwise directed.  DIET 6. You should follow a light diet the first few days after arrival home.  Be sure to include lots of fluids daily. Avoid fatty, fried foods.   CONSTIPATION 7. It is common to experience some constipation after surgery and if you are taking pain medication.  Increasing fluid intake and taking a stool softener (such as Colace) will usually help or prevent this problem from occurring.  A mild laxative (Milk of Magnesia or Miralax) should be taken according to package instructions if there are no bowel  movements after 48 hours.  WOUND/INCISION CARE 8. Most patients will experience some swelling and bruising in the area of the incisions.  Ice packs will help.  Swelling and bruising can take several days to resolve.  9. Unless discharge instructions indicate otherwise, follow guidelines below  a. STERI-STRIPS - you may remove your outer bandages 48 hours after surgery, and you may shower at that time.  You have steri-strips (small skin tapes) in place directly over the incision.  These strips should be left on the skin for 7-10 days.   b. DERMABOND/SKIN GLUE - you may shower in 24 hours.  The glue will flake off over the next 2-3 weeks. 10. Any sutures or staples will be removed at the office during your follow-up visit.  ACTIVITIES 11. You may resume regular (light) daily activities beginning the next day--such as daily self-care, walking, climbing stairs--gradually increasing activities as tolerated.  You may have sexual intercourse when it is comfortable.  Refrain from any heavy lifting or straining until approved by your doctor. a. You may drive when you are no longer taking prescription pain medication, you can comfortably wear a seatbelt, and you can safely maneuver your car and apply brakes.  FOLLOW-UP 12. You should see your doctor in the office for a follow-up appointment approximately 2-3 weeks after your surgery.  You should have been given your post-op/follow-up appointment when your surgery was scheduled.  If you did not receive a post-op/follow-up appointment, make sure that you call for this appointment within a day or two after you arrive home to insure a convenient appointment time.  WHEN   TO CALL YOUR DOCTOR: 1. Fever over 101.0 2. Inability to urinate 3. Continued bleeding from incision. 4. Increased pain, redness, or drainage from the incision. 5. Increasing abdominal pain  The clinic staff is available to answer your questions during regular business hours.  Please don't  hesitate to call and ask to speak to one of the nurses for clinical concerns.  If you have a medical emergency, go to the nearest emergency room or call 911.  A surgeon from [facility] is always on call at the hospital. [address], [address], [place], [place]  [address] ? [address] [address], [place],    [address] [phone] ? [phone] ? FAX [phone] Web site: [email or web address]  

## 2020-08-04 NOTE — Discharge Summary (Deleted)
[facility] Discharge Summary   Patient ID: Theresa Underwood MRN: [id number] DOB/AGE: Jan 23, 1941 80 y.o.  Admit date: 07/31/2020 Discharge date: 08/04/2020  Discharge Diagnosis Patient Active Problem List   Diagnosis Date Noted  . Bile duct stone   . Elevated LFTs   . Abnormal cholangiogram   . Cholelithiasis 07/31/2020  . IBS (irritable bowel syndrome) 02/01/2016  . Generalized abdominal pain 02/01/2016  . Nausea without vomiting 02/01/2016  . Cervical radiculopathy 10/25/2013  . Hypothyroidism   . History of TMJ disorder   . Depression   . DDD (degenerative disc disease)   . Arthritis   . Colon polyps   . Anemia   . Spinal stenosis   . Hyperlipidemia   . Bipolar disorder ([place])   . Peripheral neuropathy   . Glucose intolerance (impaired glucose tolerance)   . Hypertension   . Diverticulosis   . Cystocele     Consultants GI - Dr. Fuller Plan  Imaging: DG ERCP BILIARY & PANCREATIC DUCTS  Result Date: 08/03/2020 CLINICAL DATA:  80 year old female status post laparoscopic cholecystectomy EXAM: ERCP TECHNIQUE: Multiple spot images obtained with the fluoroscopic device and submitted for interpretation post-procedure. FLUOROSCOPY TIME:  Fluoroscopy Time:  3 minutes 54 seconds COMPARISON:  CT 07/31/2020, intraoperative images [id number] FINDINGS: Limited intraoperative fluoroscopic spot images during ERCP. Initial image demonstrates endoscope projecting over the upper abdomen. Subsequently there is cannulation of the ampulla and retrograde infusion of contrast into the extrahepatic biliary system. Deployment of a retrieval balloon. IMPRESSION: Limited images during ERCP demonstrates deployment of a retrieval balloon for treatment of choledocholithiasis. Please refer to the dictated operative report for full details of intraoperative findings and procedure. Electronically Signed   By: Corrie Mckusick D.O.   On: 08/03/2020 13:13    Procedures Dr. Ralene Ok- Laparoscopic  Cholecystectomy with [facility] 08/01/20 Dr. Kennedy Bucker (08/03/20) - ERCP with biliary sphincterotomy and balloon extraction of choledocholithiasis.  Hospital Course:  This is a very pleasant 80 yo white female with a history of bipolar d/o, HTN, hypothyroidism, peripheral neuropathy, and IBS.  She is followed by Dr. Henrene Pastor and [facility] for her IBS and c-scope needs.  She frequently has soft stools multiple times a day and then some days normal BMs.  She had an episode of maybe some abdominal discomfort around Christmas and one other time a couple of weeks ago according to her family.  She has always attributed this to her IBS.  Last night she ate a spicy bratwurst with sour kraut and a beer.  She had some epigastric discomfort before going to bed that she states was a burning type pain.  This did not prevent her from falling asleep.  She slept all night and woke up at 7:15am this morning with horrible epigastric burning pain that radiated up to her left chest.  She went to the urgent care who ruled out cardiac origin.  She was referred to the ED and while getting in her car, she developed nausea and vomiting.  She vomited several more times since being in the ED.  She also developed a temp of 101 which resolved with Tylenol. She had a work up with a CT of her A/P which revealed a dilated CBD of 1.1cm but no obvious defect along with some possible gb wall thickening.  Her LFTs are elevated along with slight elevation of her TB at 1.4. patient was admitted and underwent procedure above by dr. Rosendo Gros where choledocholithiasis was identified. GI was consulted and patient underwent  ERCP as above with successful removal of CBD stones. Diet was advanced as tolerated.   Patients cultures as below were abnormal resulting in ID consult:  E.coli bacteremia - sensitive to augmentin, ID recommended total of 7 days since source contol.  Urine Culture w/ klebsiella pneumonia, sensitive to ceftriaxone while patient has been on  in the hospital. This would cover UTI.    Physical Exam: General:  Alert, NAD, pleasant, comfortable Abd:  Soft, ND, nontender, incisions C/D/I  Allergies as of 08/04/2020      Reactions   Sulfa Antibiotics Hives   Carbamazepine    REACTION: hives with tegretol   Sulfonamide Derivatives    REACTION: hives      Medication List    TAKE these medications   acetaminophen 500 MG tablet Commonly known as: TYLENOL Take 2 tablets (1,000 mg total) by mouth every 6 (six) hours. What changed:   how much to take  when to take this  reasons to take this   amoxicillin-clavulanate 875-125 MG tablet Commonly known as: AUGMENTIN Take 1 tablet by mouth 2 (two) times daily for 7 days. Treat through 08/08/20   clobetasol cream 0.05 % Commonly known as: TEMOVATE Apply 1 application topically as needed (rash).   clonazePAM 0.5 MG tablet Commonly known as: KLONOPIN Take 0.5 mg by mouth at bedtime.   gabapentin 600 MG tablet Commonly known as: NEURONTIN TAKE 1 TABLET(600 MG) BY MOUTH THREE TIMES DAILY What changed:   how much to take  how to take this  when to take this   hydrocortisone 2.5 % rectal cream Commonly known as: ANUSOL-HC Place 1 application rectally 3 (three) times daily.   hyoscyamine 0.125 MG SL tablet Commonly known as: LEVSIN SL Place 1 tablet (0.125 mg total) under the tongue every 6 (six) hours as needed. What changed: reasons to take this   lamoTRIgine 200 MG tablet Commonly known as: LAMICTAL Take 200 mg by mouth daily.   levothyroxine 112 MCG tablet Commonly known as: SYNTHROID Take 112 mcg by mouth at bedtime. Brand name only   lisinopril 10 MG tablet Commonly known as: ZESTRIL Take 10 mg by mouth 3 (three) times a week. Take on Monday, Wednesday, Friday   mupirocin ointment 2 % Commonly known as: BACTROBAN Apply 1 application topically daily as needed (cuts, wounds).   OVER THE COUNTER MEDICATION Take 1 tablet by mouth in the morning, at  noon, and at bedtime. D-Mannose   traMADol 50 MG tablet Commonly known as: ULTRAM Take 1 tablet (50 mg total) by mouth every 6 (six) hours as needed for moderate pain (not releieved by tylenol).          Signed: Obie Dredge, [facility] 08/04/2020, 9:45 AM

## 2020-08-04 NOTE — Discharge Planning (Signed)
Patient discharged home in stable condition. Verbalizes understanding of all discharge instructions, including home medications and follow up appointments. 

## 2020-08-04 NOTE — Progress Notes (Signed)
    Progress Note   Subjective  Feels well. No significant pain.   Objective  Vital signs in last 24 hours: Temp:  [97.7 F (36.5 C)-98.7 F (37.1 C)] 98.2 F (36.8 C) (02/18 0437) Pulse Rate:  [56-69] 69 (02/18 0437) Resp:  [16-17] 16 (02/18 0437) BP: (110-152)/(47-80) 135/70 (02/18 0437) SpO2:  [96 %-97 %] 97 % (02/18 0437) Last BM Date: 08/03/20  General: Alert, well-developed, in NAD Heart:  Regular rate and rhythm; no murmurs Chest: Clear to ascultation bilaterally Abdomen:  Soft, nontender and nondistended. Normal bowel sounds, without guarding, and without rebound.   Extremities:  Without edema. Neurologic:  Alert and  oriented x4; grossly normal neurologically. Psych:  Alert and cooperative. Normal mood and affect.  Intake/Output from previous day: 02/17 0701 - 02/18 0700 In: 2974.7 [P.O.:750; I.V.:2224.7] Out: -  Intake/Output this shift: Total I/O In: 300 [P.O.:300] Out: -   Lab Results: Recent Labs    08/02/20 0235 08/03/20 0555  WBC 12.4* 8.0  HGB 8.6* 8.6*  HCT 26.5* 26.9*  PLT 284 295   BMET Recent Labs    08/02/20 0235 08/03/20 0555 08/04/20 0040  NA 131* 137 133*  K 4.2 4.1 4.9  CL 102 109 108  CO2 20* 20* 18*  GLUCOSE 325* 117* 202*  BUN 12 10 13   CREATININE 0.98 0.99 0.97  CALCIUM 7.9* 8.2* 7.9*   LFT Recent Labs    08/04/20 0040  PROT 5.6*  ALBUMIN 2.6*  AST 26  ALT 91*  ALKPHOS 123  BILITOT 1.0    Studies/Results: DG ERCP BILIARY & PANCREATIC DUCTS  Result Date: 08/03/2020 CLINICAL DATA:  80 year old female status post laparoscopic cholecystectomy EXAM: ERCP TECHNIQUE: Multiple spot images obtained with the fluoroscopic device and submitted for interpretation post-procedure. FLUOROSCOPY TIME:  Fluoroscopy Time:  3 minutes 54 seconds COMPARISON:  CT 07/31/2020, intraoperative images [id number] FINDINGS: Limited intraoperative fluoroscopic spot images during ERCP. Initial image demonstrates endoscope projecting over the upper  abdomen. Subsequently there is cannulation of the ampulla and retrograde infusion of contrast into the extrahepatic biliary system. Deployment of a retrieval balloon. IMPRESSION: Limited images during ERCP demonstrates deployment of a retrieval balloon for treatment of choledocholithiasis. Please refer to the dictated operative report for full details of intraoperative findings and procedure. Electronically Signed   By: Corrie Mckusick D.O.   On: 08/03/2020 13:13      Assessment & Recommendations   1. Choledocholithiasis. S/P ERCP w/ sphincterotomy, sphincteroplasty and CBD stone extraction yesterday. LFTs improved. Stable for discharge today. Follow up with PCP with repeat LFTs in 3-4 weeks. Outpatient GI follow up with Dr. Scarlette Shorts as needed.   2. S/P lap chole with IOC on 2/15 for cholecystitis.     LOS: 3 days   Norberto Sorenson T. Fuller Plan MD 08/04/2020, 11:11 AM [phone]

## 2020-08-05 LAB — CULTURE, BLOOD (ROUTINE X 2)
Culture: NO GROWTH
Special Requests: ADEQUATE

## 2020-08-06 ENCOUNTER — Encounter (HOSPITAL_COMMUNITY): Payer: Self-pay | Admitting: Gastroenterology

## 2020-08-15 NOTE — Discharge Summary (Signed)
Sepsis ruled out  [facility] Surgery Discharge Summary   Patient ID: Theresa Underwood MRN: [id number] DOB/AGE: 1940-12-09 80 y.o.  Admit date: 07/31/2020 Discharge date: 08/04/2020  Discharge Diagnosis     Patient Active Problem List   Diagnosis Date Noted  . Bile duct stone   . Elevated LFTs   . Abnormal cholangiogram   . Cholelithiasis 07/31/2020  . IBS (irritable bowel syndrome) 02/01/2016  . Generalized abdominal pain 02/01/2016  . Nausea without vomiting 02/01/2016  . Cervical radiculopathy 10/25/2013  . Hypothyroidism   . History of TMJ disorder   . Depression   . DDD (degenerative disc disease)   . Arthritis   . Colon polyps   . Anemia   . Spinal stenosis   . Hyperlipidemia   . Bipolar disorder ([place])   . Peripheral neuropathy   . Glucose intolerance (impaired glucose tolerance)   . Hypertension   . Diverticulosis   . Cystocele     Consultants GI - Dr. Fuller Plan  Imaging:  Imaging Results (Last 48 hours)  DG ERCP BILIARY & PANCREATIC DUCTS  Result Date: 08/03/2020 CLINICAL DATA:  80 year old female status post laparoscopic cholecystectomy EXAM: ERCP TECHNIQUE: Multiple spot images obtained with the fluoroscopic device and submitted for interpretation post-procedure. FLUOROSCOPY TIME:  Fluoroscopy Time:  3 minutes 54 seconds COMPARISON:  CT 07/31/2020, intraoperative images [id number] FINDINGS: Limited intraoperative fluoroscopic spot images during ERCP. Initial image demonstrates endoscope projecting over the upper abdomen. Subsequently there is cannulation of the ampulla and retrograde infusion of contrast into the extrahepatic biliary system. Deployment of a retrieval balloon. IMPRESSION: Limited images during ERCP demonstrates deployment of a retrieval balloon for treatment of choledocholithiasis. Please refer to the dictated operative report for full details of intraoperative findings and procedure. Electronically Signed   By: Corrie Mckusick D.O.   On: 08/03/2020 13:13     Procedures Dr. Ralene Ok- Laparoscopic Cholecystectomy with [facility] [place] 08/01/20 Dr. Kennedy Bucker (08/03/20) - ERCP with biliary sphincterotomy and balloon extraction of choledocholithiasis.  Hospital Course:  This is a very pleasant 80 yo white female with a history of bipolar d/o, HTN, hypothyroidism, peripheral neuropathy, and IBS. She is followed by Dr. Henrene Pastor and [facility] for her IBS and c-scope needs. She frequently has soft stools multiple times a day and then some days normal BMs. She had an episode of maybe some abdominal discomfort around Christmas and one other time a couple of weeks ago according to her family. She has always attributed this to her IBS. Last night she ate a spicy bratwurst with sour kraut and a beer. She had some epigastric discomfort before going to bed that she states was a burning type pain. This did not prevent her from falling asleep. She slept all night and woke up at 7:15am this morning with horrible epigastric burning pain that radiated up to her left chest. She went to the urgent care who ruled out cardiac origin. She was referred to the ED and while getting in her car, she developed nausea and vomiting. She vomited several more times since being in the ED. She also developed a temp of 101 which resolved with Tylenol.She had a work up with a CT of her A/P which revealed a dilated CBD of 1.1cm but no obvious defect along with some possible gb wall thickening. Her LFTs are elevated along with slight elevation of her TB at 1.4. patient was admitted and underwent procedure above by dr. Rosendo Gros where choledocholithiasis was identified. GI was consulted and  patient underwent ERCP as above with successful removal of CBD stones. Diet was advanced as tolerated.   Patients cultures as below were abnormal resulting in ID consult:  E.coli bacteremia - sepsis ruled out (pt febrile with intermittent low grade sinus tachycardia  on admission but was normotensive and WBC was 9.0 WNL)  sensitive to augmentin, ID recommended total of 7 days since source contol.  Urine Culture w/ klebsiella pneumonia, sensitive to ceftriaxone while patient has been on in the hospital. This would cover UTI.    Physical Exam: General:  Alert, NAD, pleasant, comfortable Abd:  Soft, ND, nontender, incisions C/D/I       Allergies as of 08/04/2020      Reactions   Sulfa Antibiotics Hives   Carbamazepine    REACTION: hives with tegretol   Sulfonamide Derivatives    REACTION: hives         Medication List    TAKE these medications   acetaminophen 500 MG tablet Commonly known as: TYLENOL Take 2 tablets (1,000 mg total) by mouth every 6 (six) hours. What changed:   how much to take  when to take this  reasons to take this   amoxicillin-clavulanate 875-125 MG tablet Commonly known as: AUGMENTIN Take 1 tablet by mouth 2 (two) times daily for 7 days. Treat through 08/08/20   clobetasol cream 0.05 % Commonly known as: TEMOVATE Apply 1 application topically as needed (rash).   clonazePAM 0.5 MG tablet Commonly known as: KLONOPIN Take 0.5 mg by mouth at bedtime.   gabapentin 600 MG tablet Commonly known as: NEURONTIN TAKE 1 TABLET(600 MG) BY MOUTH THREE TIMES DAILY What changed:   how much to take  how to take this  when to take this   hydrocortisone 2.5 % rectal cream Commonly known as: ANUSOL-HC Place 1 application rectally 3 (three) times daily.   hyoscyamine 0.125 MG SL tablet Commonly known as: LEVSIN SL Place 1 tablet (0.125 mg total) under the tongue every 6 (six) hours as needed. What changed: reasons to take this   lamoTRIgine 200 MG tablet Commonly known as: LAMICTAL Take 200 mg by mouth daily.   levothyroxine 112 MCG tablet Commonly known as: SYNTHROID Take 112 mcg by mouth at bedtime. Brand name only   lisinopril 10 MG tablet Commonly known as: ZESTRIL Take 10 mg by  mouth 3 (three) times a week. Take on Monday, Wednesday, Friday   mupirocin ointment 2 % Commonly known as: BACTROBAN Apply 1 application topically daily as needed (cuts, wounds).   OVER THE COUNTER MEDICATION Take 1 tablet by mouth in the morning, at noon, and at bedtime. D-Mannose   traMADol 50 MG tablet Commonly known as: ULTRAM Take 1 tablet (50 mg total) by mouth every 6 (six) hours as needed for moderate pain (not releieved by tylenol).        Signed: Obie Dredge, [facility] 08/04/2020, 9:45 AM

## 2020-09-05 DIAGNOSIS — M810 Age-related osteoporosis without current pathological fracture: Secondary | ICD-10-CM | POA: Diagnosis not present

## 2020-09-05 DIAGNOSIS — E785 Hyperlipidemia, unspecified: Secondary | ICD-10-CM | POA: Diagnosis not present

## 2020-09-05 DIAGNOSIS — E538 Deficiency of other specified B group vitamins: Secondary | ICD-10-CM | POA: Diagnosis not present

## 2020-09-05 DIAGNOSIS — E039 Hypothyroidism, unspecified: Secondary | ICD-10-CM | POA: Diagnosis not present

## 2020-09-12 DIAGNOSIS — R82998 Other abnormal findings in urine: Secondary | ICD-10-CM | POA: Diagnosis not present

## 2020-09-12 DIAGNOSIS — Z0000 Encounter for general adult medical examination without abnormal findings: Secondary | ICD-10-CM | POA: Diagnosis not present

## 2020-09-12 DIAGNOSIS — G3184 Mild cognitive impairment, so stated: Secondary | ICD-10-CM | POA: Diagnosis not present

## 2020-09-12 DIAGNOSIS — E785 Hyperlipidemia, unspecified: Secondary | ICD-10-CM | POA: Diagnosis not present

## 2020-09-12 DIAGNOSIS — I129 Hypertensive chronic kidney disease with stage 1 through stage 4 chronic kidney disease, or unspecified chronic kidney disease: Secondary | ICD-10-CM | POA: Diagnosis not present

## 2020-09-12 DIAGNOSIS — E039 Hypothyroidism, unspecified: Secondary | ICD-10-CM | POA: Diagnosis not present

## 2020-09-12 DIAGNOSIS — N1831 Chronic kidney disease, stage 3a: Secondary | ICD-10-CM | POA: Diagnosis not present

## 2020-09-12 DIAGNOSIS — R911 Solitary pulmonary nodule: Secondary | ICD-10-CM | POA: Diagnosis not present

## 2020-09-12 DIAGNOSIS — I700 Atherosclerosis of aorta: Secondary | ICD-10-CM | POA: Diagnosis not present

## 2020-09-12 DIAGNOSIS — E7439 Other disorders of intestinal carbohydrate absorption: Secondary | ICD-10-CM | POA: Diagnosis not present

## 2020-09-12 DIAGNOSIS — G629 Polyneuropathy, unspecified: Secondary | ICD-10-CM | POA: Diagnosis not present

## 2020-09-12 DIAGNOSIS — F1320 Sedative, hypnotic or anxiolytic dependence, uncomplicated: Secondary | ICD-10-CM | POA: Diagnosis not present

## 2020-09-12 DIAGNOSIS — F319 Bipolar disorder, unspecified: Secondary | ICD-10-CM | POA: Diagnosis not present

## 2020-09-14 DIAGNOSIS — Z1212 Encounter for screening for malignant neoplasm of rectum: Secondary | ICD-10-CM | POA: Diagnosis not present

## 2020-11-06 ENCOUNTER — Other Ambulatory Visit (HOSPITAL_COMMUNITY): Payer: Self-pay

## 2020-11-07 DIAGNOSIS — N814 Uterovaginal prolapse, unspecified: Secondary | ICD-10-CM | POA: Diagnosis not present

## 2020-11-07 DIAGNOSIS — N952 Postmenopausal atrophic vaginitis: Secondary | ICD-10-CM | POA: Diagnosis not present

## 2020-11-07 DIAGNOSIS — N390 Urinary tract infection, site not specified: Secondary | ICD-10-CM | POA: Diagnosis not present

## 2020-11-15 DIAGNOSIS — F3132 Bipolar disorder, current episode depressed, moderate: Secondary | ICD-10-CM | POA: Diagnosis not present

## 2020-12-19 DIAGNOSIS — M79675 Pain in left toe(s): Secondary | ICD-10-CM | POA: Diagnosis not present

## 2020-12-19 DIAGNOSIS — L600 Ingrowing nail: Secondary | ICD-10-CM | POA: Diagnosis not present

## 2021-01-02 DIAGNOSIS — L600 Ingrowing nail: Secondary | ICD-10-CM | POA: Diagnosis not present

## 2021-01-02 DIAGNOSIS — M79675 Pain in left toe(s): Secondary | ICD-10-CM | POA: Diagnosis not present

## 2021-01-09 DIAGNOSIS — L600 Ingrowing nail: Secondary | ICD-10-CM | POA: Diagnosis not present

## 2021-01-09 DIAGNOSIS — M79675 Pain in left toe(s): Secondary | ICD-10-CM | POA: Diagnosis not present

## 2021-02-06 DIAGNOSIS — L84 Corns and callosities: Secondary | ICD-10-CM | POA: Diagnosis not present

## 2021-02-06 DIAGNOSIS — L603 Nail dystrophy: Secondary | ICD-10-CM | POA: Diagnosis not present

## 2021-02-06 DIAGNOSIS — B351 Tinea unguium: Secondary | ICD-10-CM | POA: Diagnosis not present

## 2021-02-06 DIAGNOSIS — G603 Idiopathic progressive neuropathy: Secondary | ICD-10-CM | POA: Diagnosis not present

## 2021-02-07 ENCOUNTER — Other Ambulatory Visit: Payer: Self-pay | Admitting: Internal Medicine

## 2021-02-07 DIAGNOSIS — R911 Solitary pulmonary nodule: Secondary | ICD-10-CM

## 2021-03-02 ENCOUNTER — Ambulatory Visit
Admission: RE | Admit: 2021-03-02 | Discharge: 2021-03-02 | Disposition: A | Discharge status: Home / Self Care | Payer: PPO | Source: Ambulatory Visit | Attending: Internal Medicine | Admitting: Internal Medicine

## 2021-03-02 DIAGNOSIS — I700 Atherosclerosis of aorta: Secondary | ICD-10-CM | POA: Diagnosis not present

## 2021-03-02 DIAGNOSIS — R911 Solitary pulmonary nodule: Secondary | ICD-10-CM

## 2021-03-02 DIAGNOSIS — R918 Other nonspecific abnormal finding of lung field: Secondary | ICD-10-CM | POA: Diagnosis not present

## 2021-03-16 DIAGNOSIS — M8589 Other specified disorders of bone density and structure, multiple sites: Secondary | ICD-10-CM | POA: Diagnosis not present

## 2021-03-19 DIAGNOSIS — E538 Deficiency of other specified B group vitamins: Secondary | ICD-10-CM | POA: Diagnosis not present

## 2021-03-19 DIAGNOSIS — R911 Solitary pulmonary nodule: Secondary | ICD-10-CM | POA: Diagnosis not present

## 2021-03-19 DIAGNOSIS — R198 Other specified symptoms and signs involving the digestive system and abdomen: Secondary | ICD-10-CM | POA: Diagnosis not present

## 2021-03-19 DIAGNOSIS — G3184 Mild cognitive impairment, so stated: Secondary | ICD-10-CM | POA: Diagnosis not present

## 2021-03-19 DIAGNOSIS — I700 Atherosclerosis of aorta: Secondary | ICD-10-CM | POA: Diagnosis not present

## 2021-03-19 DIAGNOSIS — F1320 Sedative, hypnotic or anxiolytic dependence, uncomplicated: Secondary | ICD-10-CM | POA: Diagnosis not present

## 2021-03-19 DIAGNOSIS — M1990 Unspecified osteoarthritis, unspecified site: Secondary | ICD-10-CM | POA: Diagnosis not present

## 2021-03-19 DIAGNOSIS — I129 Hypertensive chronic kidney disease with stage 1 through stage 4 chronic kidney disease, or unspecified chronic kidney disease: Secondary | ICD-10-CM | POA: Diagnosis not present

## 2021-03-19 DIAGNOSIS — Z23 Encounter for immunization: Secondary | ICD-10-CM | POA: Diagnosis not present

## 2021-03-19 DIAGNOSIS — G629 Polyneuropathy, unspecified: Secondary | ICD-10-CM | POA: Diagnosis not present

## 2021-03-19 DIAGNOSIS — N812 Incomplete uterovaginal prolapse: Secondary | ICD-10-CM | POA: Diagnosis not present

## 2021-03-19 DIAGNOSIS — E039 Hypothyroidism, unspecified: Secondary | ICD-10-CM | POA: Diagnosis not present

## 2021-03-19 DIAGNOSIS — N1831 Chronic kidney disease, stage 3a: Secondary | ICD-10-CM | POA: Diagnosis not present

## 2021-03-22 DIAGNOSIS — F3132 Bipolar disorder, current episode depressed, moderate: Secondary | ICD-10-CM | POA: Diagnosis not present

## 2021-04-10 DIAGNOSIS — H2513 Age-related nuclear cataract, bilateral: Secondary | ICD-10-CM | POA: Diagnosis not present

## 2021-04-10 DIAGNOSIS — H524 Presbyopia: Secondary | ICD-10-CM | POA: Diagnosis not present

## 2021-04-10 DIAGNOSIS — H02401 Unspecified ptosis of right eyelid: Secondary | ICD-10-CM | POA: Diagnosis not present

## 2021-04-13 ENCOUNTER — Other Ambulatory Visit: Payer: Self-pay | Admitting: Obstetrics and Gynecology

## 2021-04-13 DIAGNOSIS — Z1231 Encounter for screening mammogram for malignant neoplasm of breast: Secondary | ICD-10-CM

## 2021-04-30 ENCOUNTER — Encounter: Payer: Self-pay | Admitting: Neurology

## 2021-04-30 ENCOUNTER — Other Ambulatory Visit: Payer: Self-pay

## 2021-04-30 ENCOUNTER — Ambulatory Visit: Payer: PPO | Admitting: Neurology

## 2021-04-30 VITALS — BP 148/81 | HR 70 | Ht 65.0 in | Wt 141.0 lb

## 2021-04-30 DIAGNOSIS — F418 Other specified anxiety disorders: Secondary | ICD-10-CM

## 2021-04-30 DIAGNOSIS — G629 Polyneuropathy, unspecified: Secondary | ICD-10-CM | POA: Diagnosis not present

## 2021-04-30 MED ORDER — GABAPENTIN 600 MG PO TABS
ORAL_TABLET | ORAL | 3 refills | Status: DC
Start: 2021-04-30 — End: 2022-05-13

## 2021-04-30 NOTE — Patient Instructions (Addendum)
Continue gabapentin 600mg  three times daily  Please see your psychiatry team to help with coping strategies related to your general health  Return to clinic in 1 year

## 2021-04-30 NOTE — Progress Notes (Signed)
Follow-up Visit   Date: 04/30/21    Theresa Underwood MRN: [id number] DOB: 04-08-1941   Interim History: Theresa Underwood is a 80 y.o. right-handed Caucasian female with history of hyperlipidemia, hypertension, hypothyroidism, bipolar affective disorder s/p ECT, borderline diabetes type 2, lumbar spinal stenosis, here for follow-up of hereditary peripheral neuropathy.  The patient was accompanied to the clinic by self.  History of present illness: In the early 1990s, she developed gradual onset of numbness involving the tips of her toes, described as a tight sensation over the feet. Over the years, her symptoms have progressed and now she gets numbness and prickly sensation over her lower legs (below the knee) and into her feet. Symptoms are worse when she rests and alleviated with neurontin. Of late, she has developed cold sensation of her feet and often puts them in a warm bath which helps. She takes neurontin 600mg  TID (8am, 1pm, 12am) and and lamictal 150mg  (for depression). She denies any fall and is ambulating independently. She has intermittent pain and tinging of the hands, but denies any weakness. Previously tried medications include metanx, Cymbalta, and Lyrica. She also has hammertoes and history of similar symptoms involving her mother, sister, father, and maternal grandfather. None of her family members were wheelchair-bound. Her mother has numbness/tingling of the feet and ambulated independently until late 80s then transitioned to a cane/walker due to spinal stenosis.   She was initially under the care of Dr. Parks Neptune at [facility] who diagnosed her with hereditary peripheral neuropathy in 1992 and was until his care until 1995 at which time he left the practice and transitioned care to Dr. Erling Cruz and since his retirement was last seen by Dr. Krista Blue in May 2014. Her last clinic note dated 11/05/2012 was reviewed and summarized as follows:  Initial EMG in July 1996 and January 1998 showed sensory  and motor polyneuropathy with predominantly demyelinating features. Subsequent electrodiagnostic studies in August 2001 and December 2007 was more consistent with "an axonal 'small fiber' peripheral neuropathy". There was no evidence of a lumbosacral radiculopathy.   EMG of the arms shows cervical radiculopathy affecting right C5, left C7, and bilateral T1 nerve roots.  There is no evidence of neuropathy or CTS.  Her neuropathic pain is well-controlled neurontin 600mg  TID.    In 2016, she started having memory issues, especially with short-term recall, such as trying to remember a telephone number.  She is very active and does her own IADLs. No wording finding problems, but has been told that she repeats herself.  She has a history of bipolar for which she takes lamictal.  Denies any depressive symptoms.   UPDATE 04/27/2020:  She is here for follow-up visit.  She has some progression of neuropathy in the lower legs, but pain remains controlled on gabapentin 600mg  three times daily. She has not had any falls and continues to walk unassisted.  She has many questions about prognosis of neuropathy.  She is very worried about her husband's medical issues.  She continues to have intermittent word-finding difficulty and mild memory changes.  She remains highly independent and manages all ADLs and IADLs.  She continues to work one day a week.   UPDATE 04/30/2021: She has noticed progression of neuropathy in the lower legs and feet.  Pain is worse on some days.  Her balance is worse and she is doing balance training exercises. No falls. She walks unassisted.  When her pain is severe, she takes an extra dose of gabapentin.  She complains of word-finding difficulty and memory changes. She continues to work one day per week.  No problems with driving, managing finances, or household chores. She is fearful of dementia and would rather "die".  When she sees her friends with dementia, she has thought that if she had  dementia, she would write a suicide note. She has no suicidal ideation, but admits that she has a pessimistic outlook on life. She sees a psychiatry NP for bipolar disorder but has not expressed these feelings and fears to her. She does not like virtual visits and prefers face-to-face visits.  Medications:  Current Outpatient Medications on File Prior to Visit  Medication Sig Dispense Refill   acetaminophen (TYLENOL) 500 MG tablet Take 2 tablets (1,000 mg total) by mouth every 6 (six) hours. 30 tablet 0   Cholecalciferol (D3 VITAMIN PO) Take by mouth daily.     clobetasol cream (TEMOVATE) 4.40 % Apply 1 application topically as needed (rash).     clonazePAM (KLONOPIN) 0.5 MG tablet Take 0.5 mg by mouth at bedtime.     gabapentin (NEURONTIN) 600 MG tablet TAKE 1 TABLET(600 MG) BY MOUTH THREE TIMES DAILY (Patient taking differently: Take 600 mg by mouth 3 (three) times daily. TAKE 1 TABLET(600 MG) BY MOUTH THREE TIMES DAILY) 270 tablet 3   hydrocortisone (ANUSOL-HC) 2.5 % rectal cream Place 1 application rectally 3 (three) times daily. 30 g 1   hyoscyamine (LEVSIN SL) 0.125 MG SL tablet Place 1 tablet (0.125 mg total) under the tongue every 6 (six) hours as needed. (Patient taking differently: Place 0.125 mg under the tongue every 6 (six) hours as needed for cramping.) 30 tablet 4   lamoTRIgine (LAMICTAL) 200 MG tablet Take 200 mg by mouth daily.     levothyroxine (SYNTHROID, LEVOTHROID) 112 MCG tablet Take 112 mcg by mouth at bedtime. Brand name only     lisinopril (PRINIVIL,ZESTRIL) 10 MG tablet Take 10 mg by mouth 3 (three) times a week. Take on Monday, Wednesday, Friday     mupirocin ointment (BACTROBAN) 2 % Apply 1 application topically daily as needed (cuts, wounds).  0   OVER THE COUNTER MEDICATION Take 1 tablet by mouth in the morning, at noon, and at bedtime. D-Mannose     traMADol (ULTRAM) 50 MG tablet Take 1 tablet (50 mg total) by mouth every 6 (six) hours as needed for moderate pain (not  releieved by tylenol). 15 tablet 0   No current facility-administered medications on file prior to visit.    Allergies:  Allergies  Allergen Reactions   Sulfa Antibiotics Hives   Carbamazepine     REACTION: hives with tegretol   Sulfonamide Derivatives     REACTION: hives     Vital Signs:  BP (!) 148/81   Pulse 70   Ht 5\' 5"  (1.651 m)   Wt 141 lb (64 kg)   SpO2 98%   BMI 23.46 kg/m  Neurological Exam:    MENTAL STATUS including orientation to time, place, person, recent and remote memory, attention span and concentration, language, and fund of knowledge is normal.  Speech is not dysarthric.   CRANIAL NERVES:  Normal conjugate, extra-ocular eye movements in all directions of gaze.  Subtle right ptosis.   MOTOR:  Motor strength is 5/5 in all extremities, except interosseus muscles 5-/5, ABP 4+/5, and toe extensors 5-/5 bilaterally.  Tone is normal.     REFLEXES:  Reflexes are 3+/4 throughout, except absent Achilles bilaterally.  SENSORY:  Vibration absent at the  toes bilaterally, reduced at the ankles, normal at the knees and MCP.  Temperature and pin prick intact throughout.  COORDINATION/GAIT:  Gait narrow based and stable, unassisted.  Data: EMG 10/25/2013:  There is electrophysiological evidence of a multilevel cervical radiculopathy affecting the right C5, left C7, and bilateral T1 nerve roots/segment, overall mild-to-moderate degree electrically. There is no evidence generalized sensorimotor polyneuropathy or carpal tunnel syndrome affecting the upper extremities.  Labs 10/05/2013:  Vitamin B12 291*, SPEP/UPEP with IFE - no M protein, copper 139  MRI of the lumbar spine without contrast 12/07/2004: Multilevel spondylosis, advanced at L3-4 L5-S1, and moderate to mild spondylosis L3-4 and mild spinal stenosis at L4-5. An osteophyte at L5-S1 might encroach the extraforaminal portion of the left L5 nerve root. There is no disc herniation.      IMPRESSION/PLAN: 1.   Peripheral neuropathy affecting bilateral lower extremities, mild progression.  Strong family history of neuropathy, likely hereditary (CMT1A?)  - Continue gabapentin 600mg  three times daily. OK to take an extra tablet daily as needed  - She inquired about clinical trials and I did not find any that she would be a candidate for  - Patient educated on daily foot inspection, fall prevention, and safety precautions around the home.  2.  Mild cognitive impairment, stable and most likely contributed by anxiety.  Formal neurocongitive testing declined. Patient reassured that I do not see any signs of dementia.   3. Depression, anxiety about health  - Strongly recommend that she discuss this with her psychiatry team  Return to clinic in 1 year  Total time spent reviewing records, interview, history/exam, documentation, and coordination of care on day of encounter:  30 min   Thank you for allowing me to participate in patient's care.  If I can answer any additional questions, I would be pleased to do so.    Sincerely,    Nick Armel K. Posey Pronto, DO

## 2021-05-17 ENCOUNTER — Ambulatory Visit: Payer: PPO

## 2021-06-04 DIAGNOSIS — N390 Urinary tract infection, site not specified: Secondary | ICD-10-CM | POA: Diagnosis not present

## 2021-06-04 DIAGNOSIS — N952 Postmenopausal atrophic vaginitis: Secondary | ICD-10-CM | POA: Diagnosis not present

## 2021-06-04 DIAGNOSIS — N3941 Urge incontinence: Secondary | ICD-10-CM | POA: Diagnosis not present

## 2021-06-04 DIAGNOSIS — N814 Uterovaginal prolapse, unspecified: Secondary | ICD-10-CM | POA: Diagnosis not present

## 2021-06-21 DIAGNOSIS — F3132 Bipolar disorder, current episode depressed, moderate: Secondary | ICD-10-CM | POA: Diagnosis not present

## 2021-06-28 ENCOUNTER — Ambulatory Visit
Admission: RE | Admit: 2021-06-28 | Discharge: 2021-06-28 | Disposition: A | Discharge status: Home / Self Care | Payer: PPO | Source: Ambulatory Visit | Attending: Obstetrics and Gynecology | Admitting: Obstetrics and Gynecology

## 2021-06-28 DIAGNOSIS — Z1231 Encounter for screening mammogram for malignant neoplasm of breast: Secondary | ICD-10-CM

## 2021-07-12 DIAGNOSIS — Z66 Do not resuscitate: Secondary | ICD-10-CM | POA: Diagnosis not present

## 2021-07-12 DIAGNOSIS — I8393 Asymptomatic varicose veins of bilateral lower extremities: Secondary | ICD-10-CM | POA: Diagnosis not present

## 2021-07-12 DIAGNOSIS — N1831 Chronic kidney disease, stage 3a: Secondary | ICD-10-CM | POA: Diagnosis not present

## 2021-07-12 DIAGNOSIS — F319 Bipolar disorder, unspecified: Secondary | ICD-10-CM | POA: Diagnosis not present

## 2021-07-12 DIAGNOSIS — Z79890 Hormone replacement therapy (postmenopausal): Secondary | ICD-10-CM | POA: Diagnosis not present

## 2021-07-12 DIAGNOSIS — E039 Hypothyroidism, unspecified: Secondary | ICD-10-CM | POA: Diagnosis not present

## 2021-07-12 DIAGNOSIS — G629 Polyneuropathy, unspecified: Secondary | ICD-10-CM | POA: Diagnosis not present

## 2021-07-12 DIAGNOSIS — E785 Hyperlipidemia, unspecified: Secondary | ICD-10-CM | POA: Diagnosis not present

## 2021-07-12 DIAGNOSIS — I700 Atherosclerosis of aorta: Secondary | ICD-10-CM | POA: Diagnosis not present

## 2021-07-12 DIAGNOSIS — I129 Hypertensive chronic kidney disease with stage 1 through stage 4 chronic kidney disease, or unspecified chronic kidney disease: Secondary | ICD-10-CM | POA: Diagnosis not present

## 2021-08-22 ENCOUNTER — Encounter: Payer: Self-pay | Admitting: Internal Medicine

## 2021-08-30 ENCOUNTER — Other Ambulatory Visit: Payer: Self-pay

## 2021-08-30 ENCOUNTER — Ambulatory Visit (HOSPITAL_COMMUNITY)
Admission: RE | Admit: 2021-08-30 | Discharge: 2021-08-30 | Disposition: A | Discharge status: Home / Self Care | Payer: PPO | Source: Ambulatory Visit | Attending: Adult Health | Admitting: Adult Health

## 2021-08-30 ENCOUNTER — Other Ambulatory Visit (HOSPITAL_COMMUNITY): Payer: Self-pay | Admitting: Adult Health

## 2021-08-30 DIAGNOSIS — M7989 Other specified soft tissue disorders: Secondary | ICD-10-CM | POA: Diagnosis not present

## 2021-08-30 DIAGNOSIS — I872 Venous insufficiency (chronic) (peripheral): Secondary | ICD-10-CM | POA: Diagnosis not present

## 2021-08-30 DIAGNOSIS — L538 Other specified erythematous conditions: Secondary | ICD-10-CM

## 2021-08-30 DIAGNOSIS — M79605 Pain in left leg: Secondary | ICD-10-CM | POA: Insufficient documentation

## 2021-08-30 DIAGNOSIS — I87322 Chronic venous hypertension (idiopathic) with inflammation of left lower extremity: Secondary | ICD-10-CM | POA: Diagnosis not present

## 2021-08-30 NOTE — Progress Notes (Signed)
VASCULAR LAB ? ? ? ?Left lower extremity venous duplex has been performed. ? ?See CV proc for preliminary results. ? ?Called report to [facility] ? ?Irean Kendricks, RVT ?08/30/2021, 2:46 PM ? ?

## 2021-09-20 DIAGNOSIS — F3132 Bipolar disorder, current episode depressed, moderate: Secondary | ICD-10-CM | POA: Diagnosis not present

## 2021-09-21 DIAGNOSIS — M67471 Ganglion, right ankle and foot: Secondary | ICD-10-CM | POA: Diagnosis not present

## 2021-09-21 DIAGNOSIS — M792 Neuralgia and neuritis, unspecified: Secondary | ICD-10-CM | POA: Diagnosis not present

## 2021-09-21 DIAGNOSIS — M2041 Other hammer toe(s) (acquired), right foot: Secondary | ICD-10-CM | POA: Diagnosis not present

## 2021-09-21 DIAGNOSIS — L84 Corns and callosities: Secondary | ICD-10-CM | POA: Diagnosis not present

## 2021-09-21 DIAGNOSIS — M67472 Ganglion, left ankle and foot: Secondary | ICD-10-CM | POA: Diagnosis not present

## 2021-09-21 DIAGNOSIS — G629 Polyneuropathy, unspecified: Secondary | ICD-10-CM | POA: Diagnosis not present

## 2021-09-24 DIAGNOSIS — E039 Hypothyroidism, unspecified: Secondary | ICD-10-CM | POA: Diagnosis not present

## 2021-09-24 DIAGNOSIS — I700 Atherosclerosis of aorta: Secondary | ICD-10-CM | POA: Diagnosis not present

## 2021-09-24 DIAGNOSIS — E785 Hyperlipidemia, unspecified: Secondary | ICD-10-CM | POA: Diagnosis not present

## 2021-09-24 DIAGNOSIS — M810 Age-related osteoporosis without current pathological fracture: Secondary | ICD-10-CM | POA: Diagnosis not present

## 2021-09-25 DIAGNOSIS — M25512 Pain in left shoulder: Secondary | ICD-10-CM | POA: Diagnosis not present

## 2021-09-26 DIAGNOSIS — E785 Hyperlipidemia, unspecified: Secondary | ICD-10-CM | POA: Diagnosis not present

## 2021-10-01 DIAGNOSIS — E039 Hypothyroidism, unspecified: Secondary | ICD-10-CM | POA: Diagnosis not present

## 2021-10-01 DIAGNOSIS — R82998 Other abnormal findings in urine: Secondary | ICD-10-CM | POA: Diagnosis not present

## 2021-10-01 DIAGNOSIS — Z1212 Encounter for screening for malignant neoplasm of rectum: Secondary | ICD-10-CM | POA: Diagnosis not present

## 2021-10-01 DIAGNOSIS — G629 Polyneuropathy, unspecified: Secondary | ICD-10-CM | POA: Diagnosis not present

## 2021-10-01 DIAGNOSIS — F1320 Sedative, hypnotic or anxiolytic dependence, uncomplicated: Secondary | ICD-10-CM | POA: Diagnosis not present

## 2021-10-01 DIAGNOSIS — I129 Hypertensive chronic kidney disease with stage 1 through stage 4 chronic kidney disease, or unspecified chronic kidney disease: Secondary | ICD-10-CM | POA: Diagnosis not present

## 2021-10-01 DIAGNOSIS — Z1389 Encounter for screening for other disorder: Secondary | ICD-10-CM | POA: Diagnosis not present

## 2021-10-01 DIAGNOSIS — M810 Age-related osteoporosis without current pathological fracture: Secondary | ICD-10-CM | POA: Diagnosis not present

## 2021-10-01 DIAGNOSIS — M722 Plantar fascial fibromatosis: Secondary | ICD-10-CM | POA: Diagnosis not present

## 2021-10-01 DIAGNOSIS — Z0000 Encounter for general adult medical examination without abnormal findings: Secondary | ICD-10-CM | POA: Diagnosis not present

## 2021-10-01 DIAGNOSIS — E785 Hyperlipidemia, unspecified: Secondary | ICD-10-CM | POA: Diagnosis not present

## 2021-10-01 DIAGNOSIS — N1831 Chronic kidney disease, stage 3a: Secondary | ICD-10-CM | POA: Diagnosis not present

## 2021-10-01 DIAGNOSIS — Z1331 Encounter for screening for depression: Secondary | ICD-10-CM | POA: Diagnosis not present

## 2021-10-01 DIAGNOSIS — M1990 Unspecified osteoarthritis, unspecified site: Secondary | ICD-10-CM | POA: Diagnosis not present

## 2021-10-01 DIAGNOSIS — I700 Atherosclerosis of aorta: Secondary | ICD-10-CM | POA: Diagnosis not present

## 2021-10-01 DIAGNOSIS — E7439 Other disorders of intestinal carbohydrate absorption: Secondary | ICD-10-CM | POA: Diagnosis not present

## 2021-10-02 DIAGNOSIS — M2042 Other hammer toe(s) (acquired), left foot: Secondary | ICD-10-CM | POA: Diagnosis not present

## 2021-10-02 DIAGNOSIS — M2041 Other hammer toe(s) (acquired), right foot: Secondary | ICD-10-CM | POA: Diagnosis not present

## 2021-11-01 DIAGNOSIS — M2042 Other hammer toe(s) (acquired), left foot: Secondary | ICD-10-CM | POA: Diagnosis not present

## 2021-11-01 DIAGNOSIS — M2041 Other hammer toe(s) (acquired), right foot: Secondary | ICD-10-CM | POA: Diagnosis not present

## 2021-11-14 DIAGNOSIS — E039 Hypothyroidism, unspecified: Secondary | ICD-10-CM | POA: Diagnosis not present

## 2021-11-14 DIAGNOSIS — I129 Hypertensive chronic kidney disease with stage 1 through stage 4 chronic kidney disease, or unspecified chronic kidney disease: Secondary | ICD-10-CM | POA: Diagnosis not present

## 2021-11-23 ENCOUNTER — Ambulatory Visit: Payer: PPO | Admitting: Internal Medicine

## 2021-11-23 ENCOUNTER — Encounter: Payer: Self-pay | Admitting: Internal Medicine

## 2021-11-23 VITALS — BP 126/70 | HR 65 | Ht 65.0 in | Wt 144.0 lb

## 2021-11-23 DIAGNOSIS — K589 Irritable bowel syndrome without diarrhea: Secondary | ICD-10-CM

## 2021-11-23 DIAGNOSIS — R159 Full incontinence of feces: Secondary | ICD-10-CM | POA: Diagnosis not present

## 2021-11-23 DIAGNOSIS — R195 Other fecal abnormalities: Secondary | ICD-10-CM

## 2021-11-23 DIAGNOSIS — Z1211 Encounter for screening for malignant neoplasm of colon: Secondary | ICD-10-CM

## 2021-11-23 NOTE — Patient Instructions (Signed)
If you are age 80 or older, your body mass index should be between 23-30. Your Body mass index is 23.96 kg/m. If this is out of the aforementioned range listed, please consider follow up with your Primary Care Provider.  If you are age 92 or younger, your body mass index should be between 19-25. Your Body mass index is 23.96 kg/m. If this is out of the aformentioned range listed, please consider follow up with your Primary Care Provider.   ________________________________________________________  The [facility] providers would like to encourage you to use [facility] to communicate with providers for non-urgent requests or questions.  Due to long hold times on the telephone, sending your provider a message by [facility] may be a faster and more efficient way to get a response.  Please allow 48 business hours for a response.  Please remember that this is for non-urgent requests.  _______________________________________________________  Take Citrucel or Metamucil daily

## 2021-11-23 NOTE — Progress Notes (Signed)
HISTORY OF PRESENT ILLNESS:  Theresa Underwood is a 81 y.o. female with medical history as listed below.  She presents today to discuss her need for screening colonoscopy.  The patient underwent colonoscopy in 2009 with right-sided hyperplastic polyp.  Follow-up colonoscopy February 2013 revealed left-sided diverticulosis but was otherwise normal.  No polyps.  Routine follow-up in 10 years recommended.  She was sent recall letter.  She responds to that letter at this time.  She is sent into the office due to her age.  The patient was last seen in this office by the GI physician assistant January 2022.  Felt to have probable irritable bowel syndrome with some degree of incontinence.  These issues continue without worsening.  Has a history of symptomatic gallstones for which she is status post cholecystectomy as well as ERCP with sphincterotomy and common duct stone extraction February 2022.  Patient tells me that she has had no change in her bowel habits.  No significant bleeding.  No family history of colon cancer in first-degree relatives.  Review of outside records shows Hemoccult testing October 01, 2021.  This was normal or negative.  Review of blood work shows a hemoglobin of 10.6 with normal MCV.  She also underwent upper endoscopy in 2013.  This was essentially normal with negative duodenal biopsies.  She remains active working 1 day/week as well as taking exercise classes.  REVIEW OF SYSTEMS:  All non-GI ROS negative as otherwise stated in the HPI except for back pain, neuropathy  Past Medical History:  Diagnosis Date   Anemia    Arthritis    Bipolar disorder ([place])    Burning pain    where bladder pushes thru vaginal wall   Cholelithiasis    Colon polyps    Complication of anesthesia    slight trouble turning head side to side   Contusion of right great toe with damage to nail    right toenail black for months   Cystocele    DDD (degenerative disc disease)    Depression     Diverticulosis    Ganglion cyst of right foot    due to bone spurs   GERD (gastroesophageal reflux disease)    Glucose intolerance (impaired glucose tolerance)    Hemorrhoids    History of bladder infections    History of TMJ disorder    no problems in years, wears bite guard when tmj occurs   HOH (hard of hearing)    left more than right, mild   Hyperlipidemia    Hypertension    Hypothyroidism    Peripheral neuropathy    bilateral feet, Dr. Posey Pronto   Prolapsed bladder    Spinal stenosis    Tinnitus    left ear drum beat    Past Surgical History:  Procedure Laterality Date   ABDOMINAL HYSTERECTOMY  2002   total   BILIARY DILATION  08/03/2020   Procedure: BILIARY DILATION;  Surgeon: Ladene Artist, MD;  Location: [facility] ENDOSCOPY;  Service: Endoscopy;;   CHOLECYSTECTOMY N/A 08/01/2020   Procedure: LAPAROSCOPIC CHOLECYSTECTOMY WITH INTRAOPERATIVE CHOLANGIOGRAM;  Surgeon: Ralene Ok, MD;  Location: [facility];  Service: General;  Laterality: N/A;   CYSTOCELE REPAIR N/A 12/05/2015   Procedure: CYSTOSCOPY, ANTERIOR REPAIR (CYSTOCELE), VAULT PROLAPSE AND GRAFT;  Surgeon: Bjorn Loser, MD;  Location: [facility] ORS;  Service: Urology;  Laterality: N/A;   ENDOSCOPIC RETROGRADE CHOLANGIOPANCREATOGRAPHY (ERCP) WITH PROPOFOL N/A 08/03/2020   Procedure: ENDOSCOPIC RETROGRADE CHOLANGIOPANCREATOGRAPHY (ERCP) WITH PROPOFOL;  Surgeon: Ladene Artist, MD;  Location: [facility] ENDOSCOPY;  Service: Endoscopy;  Laterality: N/A;   REMOVAL OF STONES  08/03/2020   Procedure: REMOVAL OF STONES;  Surgeon: Ladene Artist, MD;  Location[facility] ENDOSCOPY;  Service: Endoscopy;;   SPHINCTEROTOMY  08/03/2020   Procedure: Joan Mayans;  Surgeon: Ladene Artist, MD;  Location: [facility];  Service: Endoscopy;;   TONSILLECTOMY AND ADENOIDECTOMY     as a child   TOTAL KNEE ARTHROPLASTY     bi-lateral 2000,2007   TUBAL LIGATION  1976   VAGINAL PROLAPSE REPAIR N/A 12/05/2015   Procedure: VAGINAL VAULT SUSPENSION;  Surgeon:  Bjorn Loser, MD;  Location: [facility] ORS;  Service: Urology;  Laterality: N/A;    Social History Theresa Underwood  reports that she has never smoked. She has never used smokeless tobacco. She reports current alcohol use. She reports that she does not use drugs.  family history includes Bipolar disorder in her mother; Colon polyps in her father and mother; Diverticulitis in her sister; Lung disease in her father; Neuropathy in her maternal grandfather and mother; Osteoporosis in her mother; Pancreatic cancer in her maternal grandmother; Peripheral vascular disease in her maternal grandmother; Stroke in her mother.  Allergies  Allergen Reactions   Sulfa Antibiotics Hives   Carbamazepine     REACTION: hives with tegretol   Sulfonamide Derivatives     REACTION: hives       PHYSICAL EXAMINATION: Vital signs: BP 126/70   Pulse 65   Ht '5\' 5"'$  (1.651 m)   Wt 144 lb (65.3 kg)   SpO2 97%   BMI 23.96 kg/m   Constitutional: generally well-appearing, no acute distress Psychiatric: alert and oriented x3, cooperative Eyes: extraocular movements intact, anicteric, conjunctiva pink Mouth: oral pharynx moist, no lesions Neck: supple no lymphadenopathy Cardiovascular: heart regular rate and rhythm, no murmur Lungs: clear to auscultation bilaterally Abdomen: soft, nontender, nondistended, no obvious ascites, no peritoneal signs, normal bowel sounds, no organomegaly Rectal: Omitted Extremities: no clubbing, cyanosis, or lower extremity edema bilaterally Skin: no lesions on visible extremities Neuro: No focal deficits.  Cranial nerves intact  ASSESSMENT:  1.  Colon cancer screening.  Average risk.  Negative colonoscopy 10 years ago.  We discussed pros and cons of proceeding with follow-up colonoscopy at this time.  We also discussed other screening strategies such as ongoing Hemoccult testing annually.  She prefers the latter. 2.  Tendencies toward loose stools and incontinence 3.   Choledocholithiasis status post ERCP with sphincterotomy and stone extraction 4.  General medical problems   PLAN:  1.  No plans for routine screening colonoscopy at this time.  Patient prefers annual Hemoccult testing strategy. 2.  Add daily fiber supplement such as Metamucil or Citrucel 2 tablespoons.  This to bulk up stools.  This may help with stool consistency as well as incontinence issues. 3.  Wear protective undergarments 4.  Resume general medical care with Dr. Virgina Jock.  GI follow-up as needed. Total time of 30 minutes was spent during to see the patient, reviewing outside data, obtaining comprehensive history, performing medically appropriate physical exam, counseling the patient regarding the above listed issues, directing therapies, and documenting clinical information in the health record

## 2021-12-20 DIAGNOSIS — I10 Essential (primary) hypertension: Secondary | ICD-10-CM | POA: Diagnosis not present

## 2022-01-22 DIAGNOSIS — M25512 Pain in left shoulder: Secondary | ICD-10-CM | POA: Diagnosis not present

## 2022-02-12 IMAGING — MG MM DIGITAL SCREENING BILAT W/ TOMO AND CAD
8 series · 9 of 24 positions shown · non-contrast
Comparison: Previous exam(s).

CLINICAL DATA: Screening.

EXAM:
DIGITAL SCREENING BILATERAL MAMMOGRAM WITH TOMOSYNTHESIS AND CAD
TECHNIQUE: Bilateral screening digital craniocaudal and mediolateral oblique
mammograms were obtained. Bilateral screening digital breast
tomosynthesis was performed. The images were evaluated with
computer-aided detection.

[R CC synth-2D]
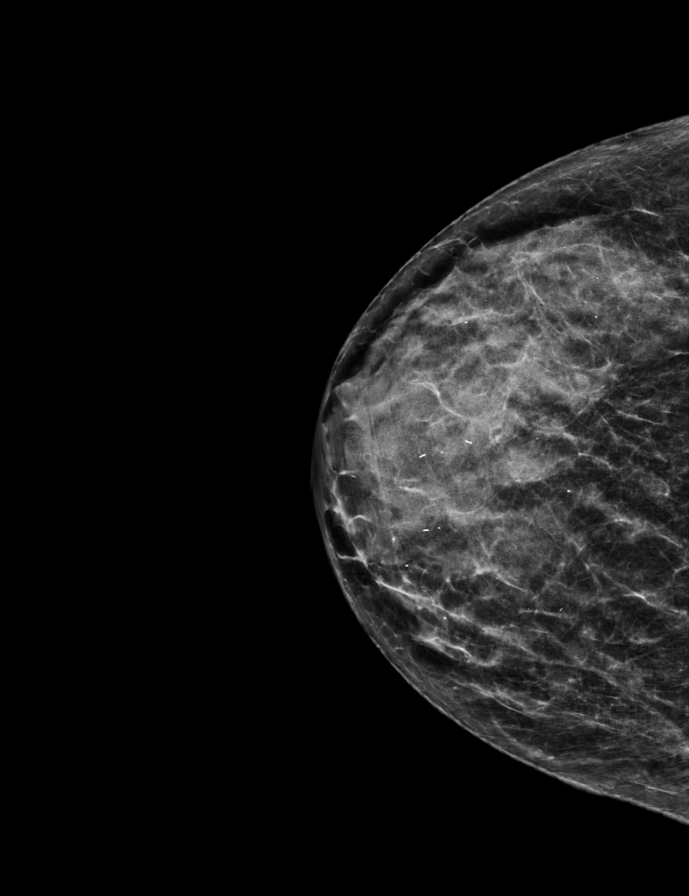

[L MLO synth-2D]
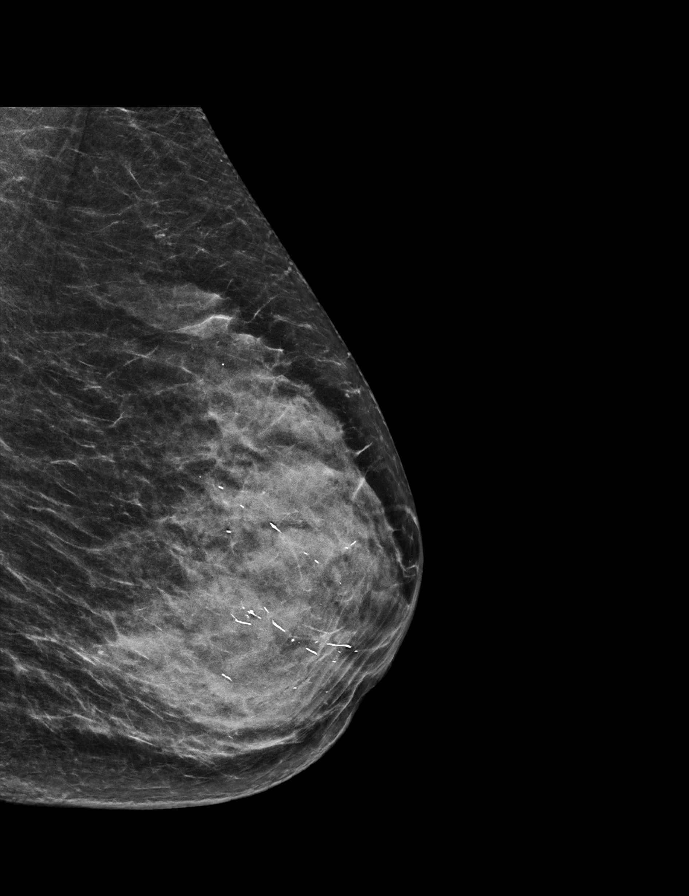

[L CC synth-2D]
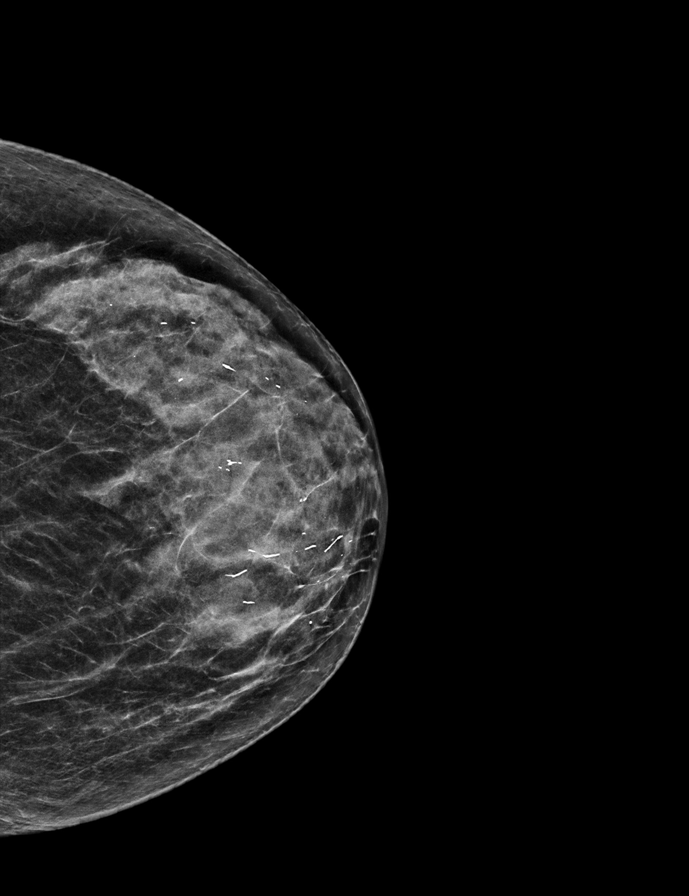

[R MLO synth-2D]
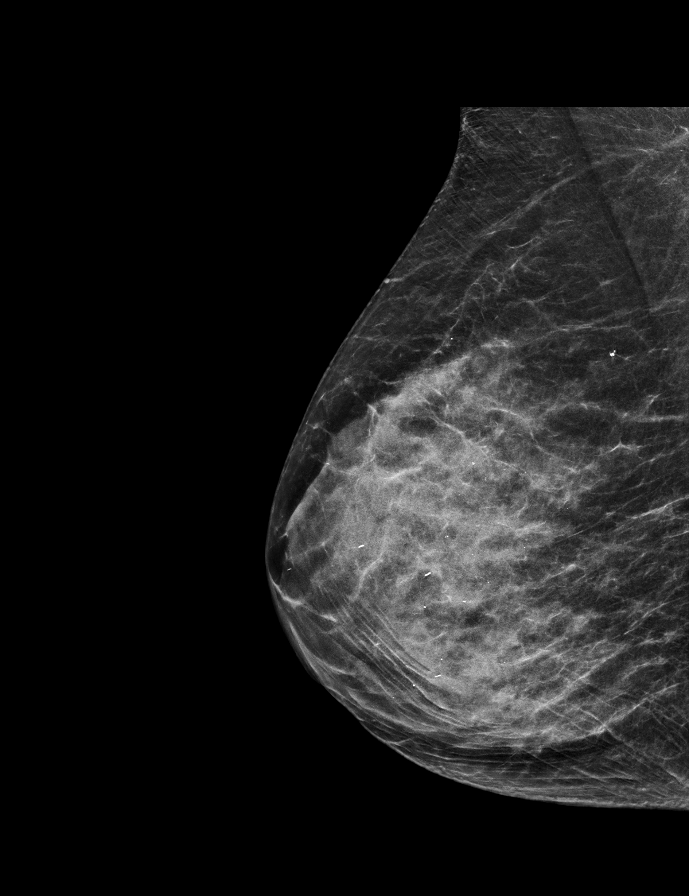

[R MLO tomo · 2 of 55 frames shown]
[frame 18/55]
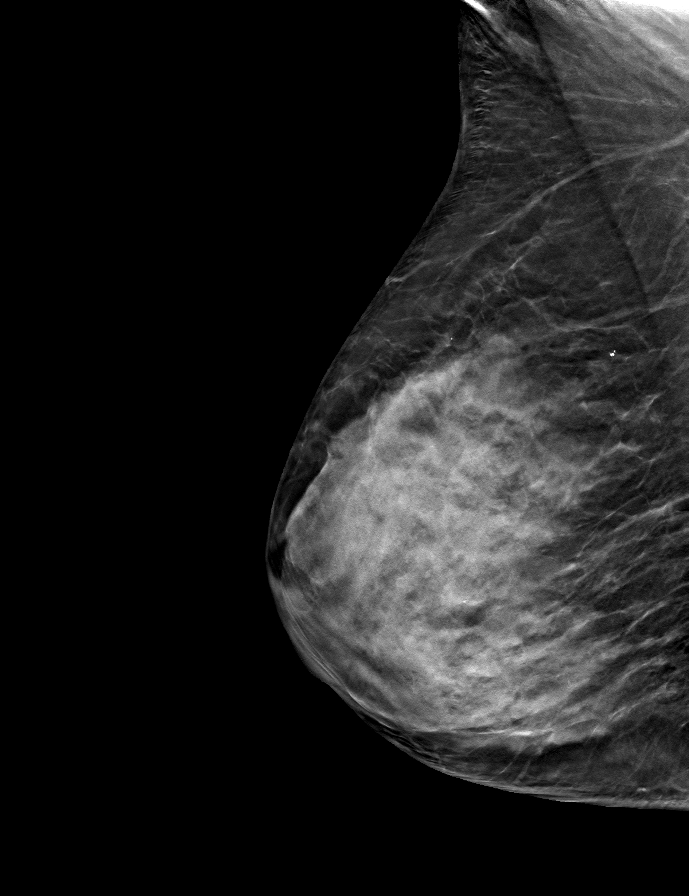
[frame 28/55]
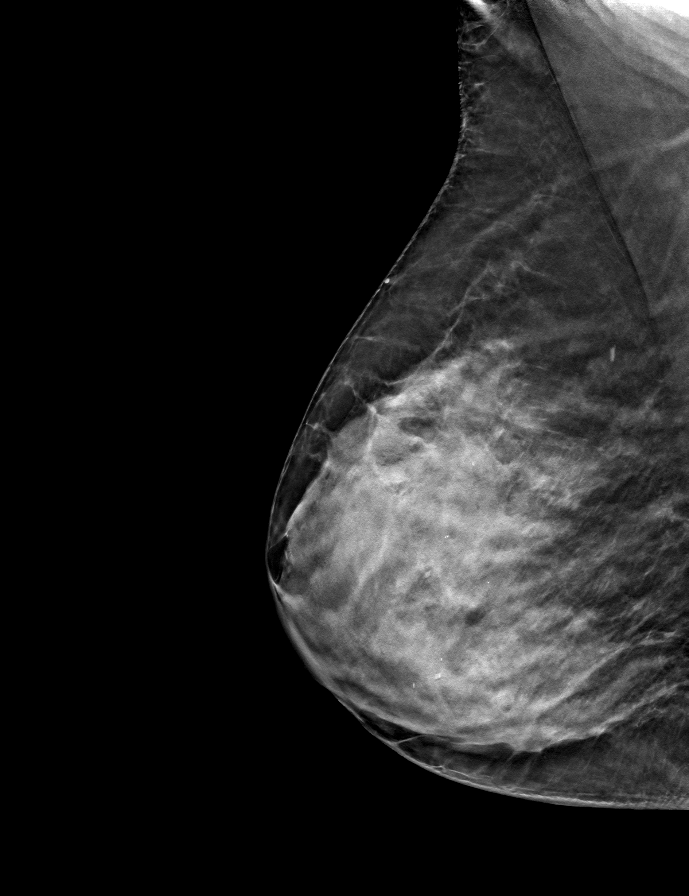

[L MLO tomo · tomo slice 27/54.0]
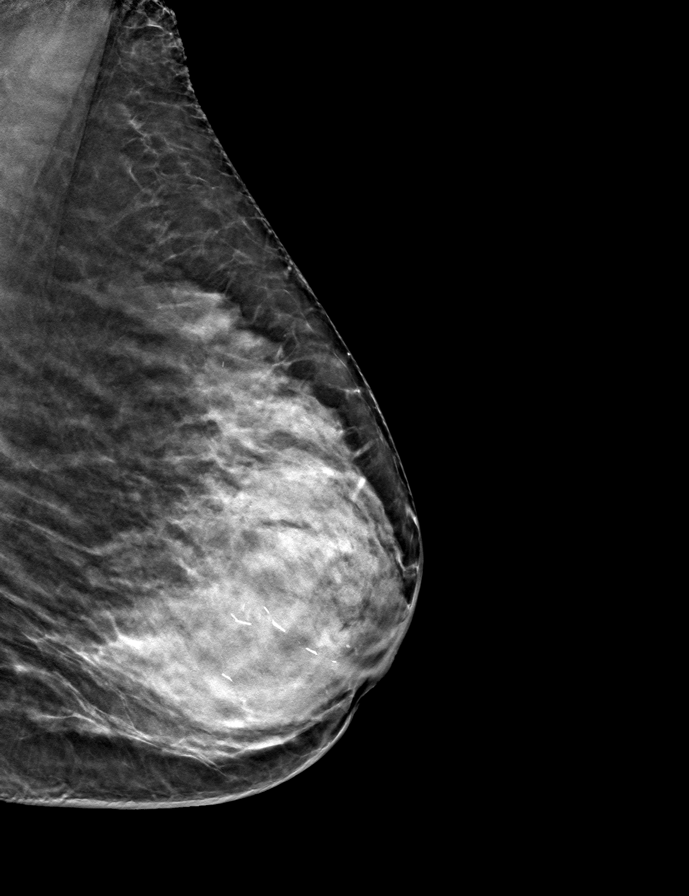

[R CC tomo · tomo slice 25/49.0]
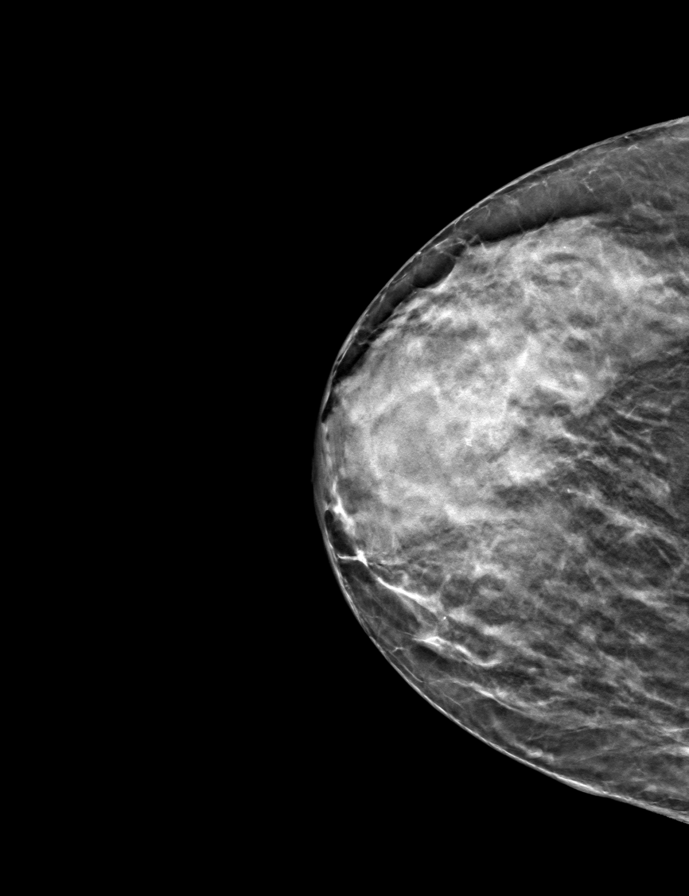

[L CC tomo · tomo slice 27/53.0]
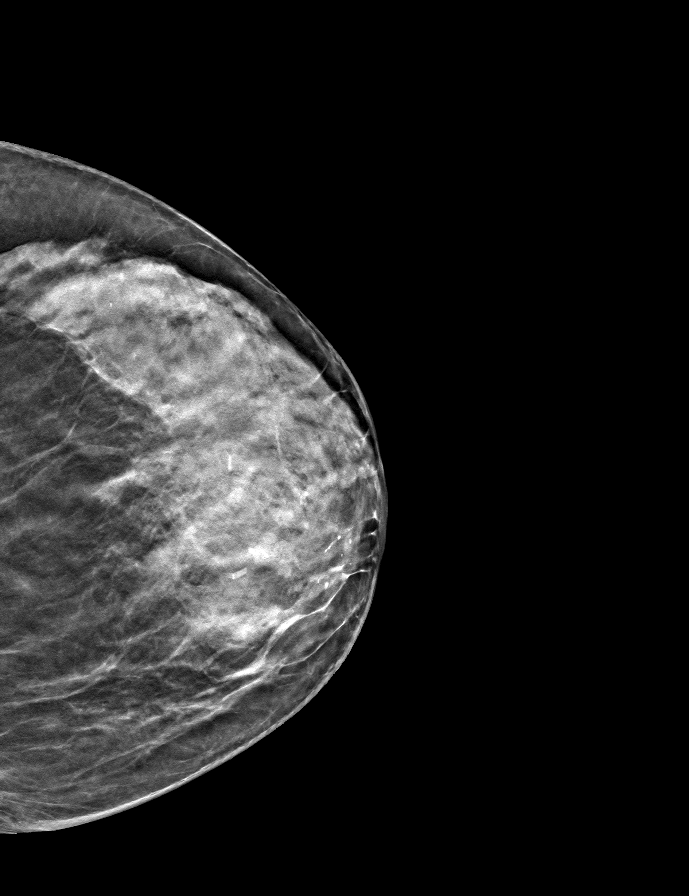

[9 of 24 positions shown; findings below may reference images not displayed]

ACR Breast Density Category d: The breast tissue is extremely dense,
which lowers the sensitivity of mammography
FINDINGS: There are no findings suspicious for malignancy.
IMPRESSION: No mammographic evidence of malignancy. A result letter of this
screening mammogram will be mailed directly to the patient.

RECOMMENDATION:
Screening mammogram in one year. (Code:TA-[id number])

BI-RADS CATEGORY  1: Negative.

## 2022-03-14 DIAGNOSIS — M2041 Other hammer toe(s) (acquired), right foot: Secondary | ICD-10-CM | POA: Diagnosis not present

## 2022-03-14 DIAGNOSIS — G629 Polyneuropathy, unspecified: Secondary | ICD-10-CM | POA: Diagnosis not present

## 2022-03-14 DIAGNOSIS — M67472 Ganglion, left ankle and foot: Secondary | ICD-10-CM | POA: Diagnosis not present

## 2022-03-14 DIAGNOSIS — M2042 Other hammer toe(s) (acquired), left foot: Secondary | ICD-10-CM | POA: Diagnosis not present

## 2022-03-14 DIAGNOSIS — M792 Neuralgia and neuritis, unspecified: Secondary | ICD-10-CM | POA: Diagnosis not present

## 2022-03-14 DIAGNOSIS — M67471 Ganglion, right ankle and foot: Secondary | ICD-10-CM | POA: Diagnosis not present

## 2022-03-14 DIAGNOSIS — L84 Corns and callosities: Secondary | ICD-10-CM | POA: Diagnosis not present

## 2022-03-18 DIAGNOSIS — N390 Urinary tract infection, site not specified: Secondary | ICD-10-CM | POA: Diagnosis not present

## 2022-04-10 ENCOUNTER — Ambulatory Visit: Payer: Self-pay

## 2022-04-10 NOTE — Patient Outreach (Signed)
  Care Coordination   04/10/2022 Name: Theresa Underwood MRN: [id number] DOB: 03/14/1941   Care Coordination Outreach Attempts:  An unsuccessful telephone outreach was attempted today to offer the patient information about available care coordination services as a benefit of their health plan.   Follow Up Plan:  Additional outreach attempts will be made to offer the patient care coordination information and services.   Encounter Outcome:  No Answer  Care Coordination Interventions Activated:  No   Care Coordination Interventions:  No, not indicated    Daneen Schick, BSW, CDP Social Worker, Certified Dementia Practitioner [facility]  Care Coordination [phone]

## 2022-04-11 DIAGNOSIS — E7439 Other disorders of intestinal carbohydrate absorption: Secondary | ICD-10-CM | POA: Diagnosis not present

## 2022-04-11 DIAGNOSIS — D649 Anemia, unspecified: Secondary | ICD-10-CM | POA: Diagnosis not present

## 2022-04-11 DIAGNOSIS — N1831 Chronic kidney disease, stage 3a: Secondary | ICD-10-CM | POA: Diagnosis not present

## 2022-04-11 DIAGNOSIS — I700 Atherosclerosis of aorta: Secondary | ICD-10-CM | POA: Diagnosis not present

## 2022-04-11 DIAGNOSIS — E039 Hypothyroidism, unspecified: Secondary | ICD-10-CM | POA: Diagnosis not present

## 2022-04-11 DIAGNOSIS — M810 Age-related osteoporosis without current pathological fracture: Secondary | ICD-10-CM | POA: Diagnosis not present

## 2022-04-11 DIAGNOSIS — E785 Hyperlipidemia, unspecified: Secondary | ICD-10-CM | POA: Diagnosis not present

## 2022-04-11 DIAGNOSIS — G629 Polyneuropathy, unspecified: Secondary | ICD-10-CM | POA: Diagnosis not present

## 2022-04-11 DIAGNOSIS — I129 Hypertensive chronic kidney disease with stage 1 through stage 4 chronic kidney disease, or unspecified chronic kidney disease: Secondary | ICD-10-CM | POA: Diagnosis not present

## 2022-04-11 DIAGNOSIS — R911 Solitary pulmonary nodule: Secondary | ICD-10-CM | POA: Diagnosis not present

## 2022-04-11 DIAGNOSIS — E538 Deficiency of other specified B group vitamins: Secondary | ICD-10-CM | POA: Diagnosis not present

## 2022-04-11 DIAGNOSIS — G3184 Mild cognitive impairment, so stated: Secondary | ICD-10-CM | POA: Diagnosis not present

## 2022-04-16 DIAGNOSIS — H5203 Hypermetropia, bilateral: Secondary | ICD-10-CM | POA: Diagnosis not present

## 2022-04-16 DIAGNOSIS — H25813 Combined forms of age-related cataract, bilateral: Secondary | ICD-10-CM | POA: Diagnosis not present

## 2022-05-13 ENCOUNTER — Ambulatory Visit: Payer: PPO | Admitting: Neurology

## 2022-05-13 ENCOUNTER — Encounter: Payer: Self-pay | Admitting: Neurology

## 2022-05-13 VITALS — BP 146/86 | HR 80 | Ht 65.0 in | Wt 139.0 lb

## 2022-05-13 DIAGNOSIS — G629 Polyneuropathy, unspecified: Secondary | ICD-10-CM

## 2022-05-13 MED ORDER — GABAPENTIN 600 MG PO TABS: ORAL_TABLET | ORAL | 3 refills | Status: DC

## 2022-05-13 NOTE — Progress Notes (Signed)
Follow-up Visit   Date: 05/13/22    Theresa Underwood MRN: [id number] DOB: 11/26/1940   Interim History: Theresa Underwood is a 81 y.o. right-handed Caucasian female with history of hyperlipidemia, hypertension, hypothyroidism, bipolar affective disorder s/p ECT, borderline diabetes type 2, lumbar spinal stenosis, here for follow-up of hereditary peripheral neuropathy.  The patient was accompanied to the clinic by self.  History of present illness: In the early 1990s, she developed gradual onset of numbness involving the tips of her toes, described as a tight sensation over the feet. Over the years, her symptoms have progressed and now she gets numbness and prickly sensation over her lower legs (below the knee) and into her feet. Symptoms are worse when she rests and alleviated with neurontin. Of late, she has developed cold sensation of her feet and often puts them in a warm bath which helps. She takes neurontin 656m TID (8am, 1pm, 12am) and and lamictal 1559m(for depression). She denies any fall and is ambulating independently. She has intermittent pain and tinging of the hands, but denies any weakness. Previously tried medications include metanx, Cymbalta, and Lyrica. She also has hammertoes and history of similar symptoms involving her mother, sister, father, and maternal grandfather. None of her family members were wheelchair-bound. Her mother has numbness/tingling of the feet and ambulated independently until late 80s then transitioned to a cane/walker due to spinal stenosis.   She [facility] with [facility] and was until his care until 1995 at which time he left the practice and transitioned care to Dr. LoErling Cruznd since his retirement was last seen by Dr. YaKrista Bluen May 2014. Her last clinic note dated 11/05/2012 was reviewed and summarized as follows:  Initial EMG in July 1996 and January 1998 showed sensory  and motor polyneuropathy with predominantly demyelinating features. Subsequent electrodiagnostic studies in August 2001 and December 2007 was more consistent with "an axonal 'small fiber' peripheral neuropathy". There was no evidence of a lumbosacral radiculopathy.   EMG of the arms shows cervical radiculopathy affecting right C5, left C7, and bilateral T1 nerve roots.  There is no evidence of neuropathy or CTS.  Her neuropathic pain is well-controlled neurontin 60073mID.    In 2016, she started having memory issues, especially with short-term recall, such as trying to remember a telephone number.  She is very active and does her own IADLs. No wording finding problems, but has been told that she repeats herself.  She has a history of bipolar for which she takes lamictal.  Denies any depressive symptoms.   UPDATE 05/13/2022:  She is here for follow-up.  She reports ongoing numbness/tingling from the mid-calf down into the feet.  She also has cold feet.  She continues to get relief with gabapentin 600m45mD and occasionally, she takes an extra dose.  Her balance is fair.  She walks unassisted and has been doing tai chi exercises.  She admits to being noncompliant with it.  She does not wish to do PT.   Medications:  Current Outpatient Medications on File Prior to Visit  Medication Sig Dispense Refill   acetaminophen (TYLENOL) 500 MG tablet Take 2 tablets (1,000 mg total) by mouth every 6 (six) hours. 30 tablet 0   Cholecalciferol (D3 VITAMIN PO) Take by mouth daily.     clobetasol cream (TEMOVATE) 0.053.15pply 1 application topically as needed (rash).     clonazePAM (KLONOPIN)  0.5 MG tablet Take 0.5 mg by mouth at bedtime.     gabapentin (NEURONTIN) 600 MG tablet TAKE 1 TABLET(600 MG) BY MOUTH THREE TIMES . OK to take extra dose as needed. 290 tablet 3   hydrocortisone (ANUSOL-HC) 2.5 % rectal cream Place 1 application rectally 3 (three) times daily. 30 g 1   hyoscyamine (LEVSIN SL) 0.125 MG SL  tablet Place 1 tablet (0.125 mg total) under the tongue every 6 (six) hours as needed. (Patient taking differently: Place 0.125 mg under the tongue every 6 (six) hours as needed for cramping.) 30 tablet 4   lamoTRIgine (LAMICTAL) 200 MG tablet Take 200 mg by mouth daily.     levothyroxine (SYNTHROID, LEVOTHROID) 112 MCG tablet Take 112 mcg by mouth at bedtime. Brand name only     lisinopril (PRINIVIL,ZESTRIL) 10 MG tablet Take 10 mg by mouth 3 (three) times a week. Take on Monday, Wednesday, Friday     OVER THE COUNTER MEDICATION Take 1 tablet by mouth in the morning, at noon, and at bedtime. D-Mannose     mupirocin ointment (BACTROBAN) 2 % Apply 1 application topically daily as needed (cuts, wounds). (Patient not taking: Reported on 11/23/2021)  0   No current facility-administered medications on file prior to visit.    Allergies:  Allergies  Allergen Reactions   Sulfa Antibiotics Hives   Carbamazepine     REACTION: hives with tegretol   Sulfonamide Derivatives     REACTION: hives     Vital Signs:  BP (!) 146/86   Pulse 80   Ht _0  (1.651 m)   Wt 139 lb (63 kg)   SpO2 97%   BMI 23.13 kg/m  Neurological Exam:    MENTAL STATUS including orientation to time, place, person, recent and remote memory, attention span and concentration, language, and fund of knowledge is normal.  Speech is not dysarthric.   CRANIAL NERVES:  Normal conjugate, extra-ocular eye movements in all directions of gaze.  Subtle right ptosis.   MOTOR:  Motor strength is 5/5 in all extremities, except interosseus muscles 5-/5, ABP 4+/5, and toe extensors 5-/5 bilaterally.  Tone is normal.     REFLEXES:  Reflexes are 3+/4 throughout, except absent Achilles bilaterally.  SENSORY:  Vibration absent at the toes bilaterally, trace at the ankles, normal at the knees and MCP.    COORDINATION/GAIT:  Gait narrow based and stable, unassisted.  Data: EMG 10/25/2013:  There is electrophysiological evidence of a  multilevel cervical radiculopathy affecting the right C5, left C7, and bilateral T1 nerve roots/segment, overall mild-to-moderate degree electrically. There is no evidence generalized sensorimotor polyneuropathy or carpal tunnel syndrome affecting the upper extremities.  Labs 10/05/2013:  Vitamin B12 291*, SPEP/UPEP with IFE - no M protein, copper 139  MRI of the lumbar spine without contrast 12/07/2004: Multilevel spondylosis, advanced at L3-4 L5-S1, and moderate to mild spondylosis L3-4 and mild spinal stenosis at L4-5. An osteophyte at L5-S1 might encroach the extraforaminal portion of the left L5 nerve root. There is no disc herniation.      IMPRESSION/PLAN: 1.  Peripheral neuropathy affecting bilateral lower extremities, mild progression.  Strong family history of neuropathy, likely hereditary (CMT1A?)  - Continue gabapentin 624m three times daily. OK to take an extra tablet daily as needed  - She inquired about clinical trials and I did not find any that she would be a candidate for  - Patient educated on daily foot inspection, fall prevention, and safety precautions around the home.  2.  Mild cognitive impairment, stable and most likely contributed by anxiety.  Formal neurocongitive testing declined. Patient reassured that I do not see any signs of dementia.   3. Depression, anxiety about health  - Strongly recommend that she discuss this with her psychiatry team  Return to clinic in 1 year  Total time spent reviewing records, interview, history/exam, documentation, and coordination of care on day of encounter:  30 min   Thank you for allowing me to participate in patient's care.  If I can answer any additional questions, I would be pleased to do so.    Sincerely,    Silus Lanzo K. Posey Pronto, DO

## 2022-05-13 NOTE — Patient Instructions (Signed)
Great to see you today!  Continue gabapentin '600mg'$  three times daily  I will see you back in 1 year

## 2022-06-18 DIAGNOSIS — N952 Postmenopausal atrophic vaginitis: Secondary | ICD-10-CM | POA: Diagnosis not present

## 2022-06-18 DIAGNOSIS — N814 Uterovaginal prolapse, unspecified: Secondary | ICD-10-CM | POA: Diagnosis not present

## 2022-06-18 DIAGNOSIS — Z87440 Personal history of urinary (tract) infections: Secondary | ICD-10-CM | POA: Diagnosis not present

## 2022-06-18 DIAGNOSIS — N3941 Urge incontinence: Secondary | ICD-10-CM | POA: Diagnosis not present

## 2022-06-18 DIAGNOSIS — R351 Nocturia: Secondary | ICD-10-CM | POA: Diagnosis not present

## 2022-06-18 DIAGNOSIS — R82998 Other abnormal findings in urine: Secondary | ICD-10-CM | POA: Diagnosis not present

## 2022-07-10 ENCOUNTER — Other Ambulatory Visit: Payer: Self-pay | Admitting: Obstetrics and Gynecology

## 2022-07-10 DIAGNOSIS — Z1231 Encounter for screening mammogram for malignant neoplasm of breast: Secondary | ICD-10-CM

## 2022-07-12 DIAGNOSIS — M19012 Primary osteoarthritis, left shoulder: Secondary | ICD-10-CM | POA: Diagnosis not present

## 2022-07-18 ENCOUNTER — Telehealth: Payer: Self-pay | Admitting: Neurology

## 2022-07-18 NOTE — Telephone Encounter (Signed)
Called patient and left message for a call back.  

## 2022-07-18 NOTE — Telephone Encounter (Signed)
Pt called in stating she has had a cold. She has been treating it with Benedryl. The more she took the [place] she noticed the pain in her feet were getting worse and worse. She is wondering if there is something different she should take that won't interfere with her feet?

## 2022-07-18 NOTE — Telephone Encounter (Signed)
I'm sorry to hear this.  Benadryl is not a medication that would make neuropathy worse, so I'm not sure why this is occurring.  Cold medications, including antihistamines (I.e. benadryl), are safe to take with neuropathy, so she may take any she prefers based on her symptoms. She can try applying lidocaine ointment to her feet as needed for worsening pain.

## 2022-07-18 NOTE — Telephone Encounter (Signed)
Pt called back in and left a message. She is returning our call

## 2022-07-19 NOTE — Telephone Encounter (Signed)
Called patient and left a message for a call back.  

## 2022-07-19 NOTE — Telephone Encounter (Signed)
Patient returned call

## 2022-07-19 NOTE — Telephone Encounter (Addendum)
Called patient and informed her of Dr. Serita Grit advice and recommendations. Patient stated it was so bizarre that it happened because within an hour of time after taking the benadryl her feet was beginning to hurt. She stated it has never done this before. She tried taking benadryl twice and the numbness and pain occurred both times.  Patient stated she thinks the benadryl is somewhat of a cause of her neuropathy. Patient stated she is going to go ahead and take another benadryl to see if it happens again. Patient had no further questions or concerns. She will call us back if she needs to update [place] on anything.

## 2022-08-06 DIAGNOSIS — H00014 Hordeolum externum left upper eyelid: Secondary | ICD-10-CM | POA: Diagnosis not present

## 2022-08-30 ENCOUNTER — Ambulatory Visit
Admission: RE | Admit: 2022-08-30 | Discharge: 2022-08-30 | Disposition: A | Discharge status: Home / Self Care | Payer: PPO | Source: Ambulatory Visit | Attending: Obstetrics and Gynecology | Admitting: Obstetrics and Gynecology

## 2022-08-30 DIAGNOSIS — Z1231 Encounter for screening mammogram for malignant neoplasm of breast: Secondary | ICD-10-CM

## 2022-09-10 DIAGNOSIS — M62838 Other muscle spasm: Secondary | ICD-10-CM | POA: Diagnosis not present

## 2022-09-10 DIAGNOSIS — M9903 Segmental and somatic dysfunction of lumbar region: Secondary | ICD-10-CM | POA: Diagnosis not present

## 2022-09-10 DIAGNOSIS — M545 Low back pain, unspecified: Secondary | ICD-10-CM | POA: Diagnosis not present

## 2022-09-10 DIAGNOSIS — M62830 Muscle spasm of back: Secondary | ICD-10-CM | POA: Diagnosis not present

## 2022-09-10 DIAGNOSIS — M9902 Segmental and somatic dysfunction of thoracic region: Secondary | ICD-10-CM | POA: Diagnosis not present

## 2022-09-10 DIAGNOSIS — M542 Cervicalgia: Secondary | ICD-10-CM | POA: Diagnosis not present

## 2022-09-10 DIAGNOSIS — M546 Pain in thoracic spine: Secondary | ICD-10-CM | POA: Diagnosis not present

## 2022-09-10 DIAGNOSIS — M9901 Segmental and somatic dysfunction of cervical region: Secondary | ICD-10-CM | POA: Diagnosis not present

## 2022-09-16 DIAGNOSIS — M9901 Segmental and somatic dysfunction of cervical region: Secondary | ICD-10-CM | POA: Diagnosis not present

## 2022-09-16 DIAGNOSIS — M542 Cervicalgia: Secondary | ICD-10-CM | POA: Diagnosis not present

## 2022-09-16 DIAGNOSIS — M62838 Other muscle spasm: Secondary | ICD-10-CM | POA: Diagnosis not present

## 2022-09-16 DIAGNOSIS — M9902 Segmental and somatic dysfunction of thoracic region: Secondary | ICD-10-CM | POA: Diagnosis not present

## 2022-09-16 DIAGNOSIS — M545 Low back pain, unspecified: Secondary | ICD-10-CM | POA: Diagnosis not present

## 2022-09-16 DIAGNOSIS — M62830 Muscle spasm of back: Secondary | ICD-10-CM | POA: Diagnosis not present

## 2022-09-16 DIAGNOSIS — M9903 Segmental and somatic dysfunction of lumbar region: Secondary | ICD-10-CM | POA: Diagnosis not present

## 2022-09-16 DIAGNOSIS — M546 Pain in thoracic spine: Secondary | ICD-10-CM | POA: Diagnosis not present

## 2022-09-23 DIAGNOSIS — M19012 Primary osteoarthritis, left shoulder: Secondary | ICD-10-CM | POA: Diagnosis not present

## 2022-09-24 DIAGNOSIS — F3132 Bipolar disorder, current episode depressed, moderate: Secondary | ICD-10-CM | POA: Diagnosis not present

## 2022-09-30 DIAGNOSIS — H52223 Regular astigmatism, bilateral: Secondary | ICD-10-CM | POA: Diagnosis not present

## 2022-09-30 DIAGNOSIS — H43813 Vitreous degeneration, bilateral: Secondary | ICD-10-CM | POA: Diagnosis not present

## 2022-09-30 DIAGNOSIS — H5203 Hypermetropia, bilateral: Secondary | ICD-10-CM | POA: Diagnosis not present

## 2022-09-30 DIAGNOSIS — H35033 Hypertensive retinopathy, bilateral: Secondary | ICD-10-CM | POA: Diagnosis not present

## 2022-09-30 DIAGNOSIS — H524 Presbyopia: Secondary | ICD-10-CM | POA: Diagnosis not present

## 2022-09-30 DIAGNOSIS — H25813 Combined forms of age-related cataract, bilateral: Secondary | ICD-10-CM | POA: Diagnosis not present

## 2022-10-07 DIAGNOSIS — I700 Atherosclerosis of aorta: Secondary | ICD-10-CM | POA: Diagnosis not present

## 2022-10-07 DIAGNOSIS — E785 Hyperlipidemia, unspecified: Secondary | ICD-10-CM | POA: Diagnosis not present

## 2022-10-07 DIAGNOSIS — D649 Anemia, unspecified: Secondary | ICD-10-CM | POA: Diagnosis not present

## 2022-10-07 DIAGNOSIS — E538 Deficiency of other specified B group vitamins: Secondary | ICD-10-CM | POA: Diagnosis not present

## 2022-10-07 DIAGNOSIS — R7989 Other specified abnormal findings of blood chemistry: Secondary | ICD-10-CM | POA: Diagnosis not present

## 2022-10-07 DIAGNOSIS — M810 Age-related osteoporosis without current pathological fracture: Secondary | ICD-10-CM | POA: Diagnosis not present

## 2022-10-07 DIAGNOSIS — E039 Hypothyroidism, unspecified: Secondary | ICD-10-CM | POA: Diagnosis not present

## 2022-10-14 DIAGNOSIS — N1831 Chronic kidney disease, stage 3a: Secondary | ICD-10-CM | POA: Diagnosis not present

## 2022-10-14 DIAGNOSIS — M810 Age-related osteoporosis without current pathological fracture: Secondary | ICD-10-CM | POA: Diagnosis not present

## 2022-10-14 DIAGNOSIS — Z0000 Encounter for general adult medical examination without abnormal findings: Secondary | ICD-10-CM | POA: Diagnosis not present

## 2022-10-14 DIAGNOSIS — R82998 Other abnormal findings in urine: Secondary | ICD-10-CM | POA: Diagnosis not present

## 2022-10-14 DIAGNOSIS — G629 Polyneuropathy, unspecified: Secondary | ICD-10-CM | POA: Diagnosis not present

## 2022-10-14 DIAGNOSIS — M1990 Unspecified osteoarthritis, unspecified site: Secondary | ICD-10-CM | POA: Diagnosis not present

## 2022-10-14 DIAGNOSIS — E785 Hyperlipidemia, unspecified: Secondary | ICD-10-CM | POA: Diagnosis not present

## 2022-10-14 DIAGNOSIS — I700 Atherosclerosis of aorta: Secondary | ICD-10-CM | POA: Diagnosis not present

## 2022-10-14 DIAGNOSIS — E538 Deficiency of other specified B group vitamins: Secondary | ICD-10-CM | POA: Diagnosis not present

## 2022-10-14 DIAGNOSIS — I129 Hypertensive chronic kidney disease with stage 1 through stage 4 chronic kidney disease, or unspecified chronic kidney disease: Secondary | ICD-10-CM | POA: Diagnosis not present

## 2022-10-14 DIAGNOSIS — F1320 Sedative, hypnotic or anxiolytic dependence, uncomplicated: Secondary | ICD-10-CM | POA: Diagnosis not present

## 2022-10-14 DIAGNOSIS — E7439 Other disorders of intestinal carbohydrate absorption: Secondary | ICD-10-CM | POA: Diagnosis not present

## 2022-10-14 DIAGNOSIS — E039 Hypothyroidism, unspecified: Secondary | ICD-10-CM | POA: Diagnosis not present

## 2022-10-31 NOTE — Patient Instructions (Signed)
DUE TO COVID-19 ONLY TWO VISITORS  (aged 81 and older)  ARE ALLOWED TO COME WITH YOU AND STAY IN THE WAITING ROOM ONLY DURING PRE OP AND PROCEDURE.   **NO VISITORS ARE ALLOWED IN THE SHORT STAY AREA OR RECOVERY ROOM!!**  IF YOU WILL BE ADMITTED INTO THE HOSPITAL YOU ARE ALLOWED ONLY FOUR SUPPORT PEOPLE DURING VISITATION HOURS ONLY (7 AM -8PM)   The support person(s) must pass our screening, gel in and out, and wear a mask at all times, including in the patient's room. Patients must also wear a mask when staff or their support person are in the room. Visitors GUEST BADGE MUST BE WORN VISIBLY  One adult visitor may remain with you overnight and MUST be in the room by 8 P.M.     Your procedure is scheduled on: 11/14/22   Report to [facility] Main Entrance    Report to admitting at : 5:15 AM   Call this number if you have problems the morning of surgery [phone]   Do not eat food :After Midnight.   After Midnight you may have the following liquids until : 4:30 AM DAY OF SURGERY  Water Black Coffee (sugar ok, NO MILK/CREAM OR CREAMERS)  Tea (sugar ok, NO MILK/CREAM OR CREAMERS) regular and decaf                             Plain Jell-O (NO RED)                                           Fruit ices (not with fruit pulp, NO RED)                                     Popsicles (NO RED)                                                                  Juice: apple, WHITE grape, WHITE cranberry Sports drinks like Gatorade (NO RED)   The day of surgery:  Drink ONE (1) Pre-Surgery Clear Ensure at : 4:30 AM the morning of surgery. Drink in one sitting. Do not sip.  This drink was given to you during your hospital  pre-op appointment visit. Nothing else to drink after completing the  Pre-Surgery Clear Ensure or G2.          If you have questions, please contact your surgeon's office.   Oral Hygiene is also important to reduce your risk of infection.                                     Remember - BRUSH YOUR TEETH THE MORNING OF SURGERY WITH YOUR REGULAR TOOTHPASTE  DENTURES WILL BE REMOVED PRIOR TO SURGERY PLEASE DO NOT APPLY "Poly grip" OR ADHESIVES!!!   Do NOT smoke after Midnight   Take these medicines the morning of surgery with A SIP OF WATER: gabapentin,lamotrigine,levothyroxine.  You may not have any metal on your body including hair pins, jewelry, and body piercing             Do not wear make-up, lotions, powders, perfumes/cologne, or deodorant  Do not wear nail polish including gel and S&S, artificial/acrylic nails, or any other type of covering on natural nails including finger and toenails. If you have artificial nails, gel coating, etc. that needs to be removed by a nail salon please have this removed prior to surgery or surgery may need to be canceled/ delayed if the surgeon/ anesthesia feels like they are unable to be safely monitored.   Do not shave  48 hours prior to surgery.    Do not bring valuables to the hospital. [facility] IS NOT             RESPONSIBLE   FOR VALUABLES.   Contacts, glasses, or bridgework may not be worn into surgery.   Bring small overnight bag day of surgery.   DO NOT BRING YOUR HOME MEDICATIONS TO THE HOSPITAL. PHARMACY WILL DISPENSE MEDICATIONS LISTED ON YOUR MEDICATION LIST TO YOU DURING YOUR ADMISSION IN THE HOSPITAL!    Patients discharged on the day of surgery will not be allowed to drive home.  Someone NEEDS to stay with you for the first 24 hours after anesthesia.   Special Instructions: Bring a copy of your healthcare power of attorney and living will documents         the day of surgery if you haven't scanned them before.              Please read over the following fact sheets you were given: IF YOU HAVE QUESTIONS ABOUT YOUR PRE-OP INSTRUCTIONS PLEASE CALL [phone]      Pre-operative 5 CHG Bath Instructions   You can play a key role in reducing the risk of infection after  surgery. Your skin needs to be as free of germs as possible. You can reduce the number of germs on your skin by washing with CHG (chlorhexidine gluconate) soap before surgery. CHG is an antiseptic soap that kills germs and continues to kill germs even after washing.   DO NOT use if you have an allergy to chlorhexidine/CHG or antibacterial soaps. If your skin becomes reddened or irritated, stop using the CHG and notify one of our RNs at : [phone].   Please shower with the CHG soap starting 4 days before surgery using the following schedule:     Please keep in mind the following:  DO NOT shave, including legs and underarms, starting the day of your first shower.   You may shave your face at any point before/day of surgery.  Place clean sheets on your bed the day you start using CHG soap. Use a clean washcloth (not used since being washed) for each shower. DO NOT sleep with pets once you start using the CHG.   CHG Shower Instructions:  If you choose to wash your hair and private area, wash first with your normal shampoo/soap.  After you use shampoo/soap, rinse your hair and body thoroughly to remove shampoo/soap residue.  Turn the water OFF and apply about 3 tablespoons (45 ml) of CHG soap to a CLEAN washcloth.  Apply CHG soap ONLY FROM YOUR NECK DOWN TO YOUR TOES (washing for 3-5 minutes)  DO NOT use CHG soap on face, private areas, open wounds, or sores.  Pay special attention to the area where your surgery is being performed.  If you are having back surgery, having someone wash your back for you may be helpful. Wait 2 minutes after CHG soap is applied, then you may rinse off the CHG soap.  Pat dry with a clean towel  Put on clean clothes/pajamas   If you choose to wear lotion, please use ONLY the CHG-compatible lotions on the back of this paper.     Additional instructions for the day of surgery: DO NOT APPLY any lotions, deodorants, cologne, or perfumes.   Put on clean/comfortable  clothes.  Brush your teeth.  Ask your nurse before applying any prescription medications to the skin.      CHG Compatible Lotions   Aveeno Moisturizing lotion  Cetaphil Moisturizing Cream  Cetaphil Moisturizing Lotion  Clairol Herbal Essence Moisturizing Lotion, Dry Skin  Clairol Herbal Essence Moisturizing Lotion, Extra Dry Skin  Clairol Herbal Essence Moisturizing Lotion, Normal Skin  Curel Age Defying Therapeutic Moisturizing Lotion with Alpha Hydroxy  Curel Extreme Care Body Lotion  Curel Soothing Hands Moisturizing Hand Lotion  Curel Therapeutic Moisturizing Cream, Fragrance-Free  Curel Therapeutic Moisturizing Lotion, Fragrance-Free  Curel Therapeutic Moisturizing Lotion, Original Formula  Eucerin Daily Replenishing Lotion  Eucerin Dry Skin Therapy Plus Alpha Hydroxy Crme  Eucerin Dry Skin Therapy Plus Alpha Hydroxy Lotion  Eucerin Original Crme  Eucerin Original Lotion  Eucerin Plus Crme Eucerin Plus Lotion  Eucerin TriLipid Replenishing Lotion  Keri Anti-Bacterial Hand Lotion  Keri Deep Conditioning Original Lotion Dry Skin Formula Softly Scented  Keri Deep Conditioning Original Lotion, Fragrance Free Sensitive Skin Formula  Keri Lotion Fast Absorbing Fragrance Free Sensitive Skin Formula  Keri Lotion Fast Absorbing Softly Scented Dry Skin Formula  Keri Original Lotion  Keri Skin Renewal Lotion Keri Silky Smooth Lotion  Keri Silky Smooth Sensitive Skin Lotion  Nivea Body Creamy Conditioning Oil  Nivea Body Extra Enriched Lotion  Nivea Body Original Lotion  Nivea Body Sheer Moisturizing Lotion Nivea Crme  Nivea Skin Firming Lotion  NutraDerm 30 Skin Lotion  NutraDerm Skin Lotion  NutraDerm Therapeutic Skin Cream  NutraDerm Therapeutic Skin Lotion  ProShield Protective Hand Cream  Provon moisturizing lotion [place]- Preparing for Total Shoulder Arthroplasty    Before surgery, you can play an important role. Because skin is not sterile, your skin needs to  be as free of germs as possible. You can reduce the number of germs on your skin by using the following products. Benzoyl Peroxide Gel Reduces the number of germs present on the skin Applied twice a day to shoulder area starting two days before surgery    ==================================================================  Please follow these instructions carefully:  BENZOYL PEROXIDE 5% GEL  Please do not use if you have an allergy to benzoyl peroxide.   If your skin becomes reddened/irritated stop using the benzoyl peroxide.  Starting two days before surgery, apply as follows: Apply benzoyl peroxide in the morning and at night. Apply after taking a shower. If you are not taking a shower clean entire shoulder front, back, and side along with the armpit with a clean wet washcloth.  Place a quarter-sized dollop on your shoulder and rub in thoroughly, making sure to cover the front, back, and side of your shoulder, along with the armpit.   2 days before ____ AM   ____ PM              1 day before ____ AM   ____ PM  Do this twice a day for two days.  (Last application is the night before surgery, AFTER using the CHG soap as described below).  Do NOT apply benzoyl peroxide gel on the day of surgery.  Incentive Spirometer  An incentive spirometer is a tool that can help keep your lungs clear and active. This tool measures how well you are filling your lungs with each breath. Taking long deep breaths may help reverse or decrease the chance of developing breathing (pulmonary) problems (especially infection) following: A long period of time when you are unable to move or be active. BEFORE THE PROCEDURE  If the spirometer includes an indicator to show your best effort, your nurse or respiratory therapist will set it to a desired goal. If possible, sit up straight or lean slightly forward. Try not to slouch. Hold the incentive spirometer in an upright position. INSTRUCTIONS  FOR USE  Sit on the edge of your bed if possible, or sit up as far as you can in bed or on a chair. Hold the incentive spirometer in an upright position. Breathe out normally. Place the mouthpiece in your mouth and seal your lips tightly around it. Breathe in slowly and as deeply as possible, raising the piston or the ball toward the top of the column. Hold your breath for 3-5 seconds or for as long as possible. Allow the piston or ball to fall to the bottom of the column. Remove the mouthpiece from your mouth and breathe out normally. Rest for a few seconds and repeat Steps 1 through 7 at least 10 times every 1-2 hours when you are awake. Take your time and take a few normal breaths between deep breaths. The spirometer may include an indicator to show your best effort. Use the indicator as a goal to work toward during each repetition. After each set of 10 deep breaths, practice coughing to be sure your lungs are clear. If you have an incision (the cut made at the time of surgery), support your incision when coughing by placing a pillow or rolled up towels firmly against it. Once you are able to get out of bed, walk around indoors and cough well. You may stop using the incentive spirometer when instructed by your caregiver.  RISKS AND COMPLICATIONS Take your time so you do not get dizzy or light-headed. If you are in pain, you may need to take or ask for pain medication before doing incentive spirometry. It is harder to take a deep breath if you are having pain. AFTER USE Rest and breathe slowly and easily. It can be helpful to keep track of a log of your progress. Your caregiver can provide you with a simple table to help with this. If you are using the spirometer at home, follow these instructions: SEEK MEDICAL CARE IF:  You are having difficultly using the spirometer. You have trouble using the spirometer as often as instructed. Your pain medication is not giving enough relief while using  the spirometer. You develop fever of 100.5 F (38.1 C) or higher. SEEK IMMEDIATE MEDICAL CARE IF:  You cough up bloody sputum that had not been present before. You develop fever of 102 F (38.9 C) or greater. You develop worsening pain at or near the incision site. MAKE SURE YOU:  Understand these instructions. Will watch your condition. Will get help right away if you are not doing well or get worse. Document Released: 10/14/2006 Document Revised: 08/26/2011 Document Reviewed: 12/15/2006 [facility] Patient Information 2014 [place], [place].  ________________________________________________________________________ 

## 2022-11-05 ENCOUNTER — Other Ambulatory Visit: Payer: Self-pay

## 2022-11-05 ENCOUNTER — Encounter (HOSPITAL_COMMUNITY)
Admission: RE | Admit: 2022-11-05 | Discharge: 2022-11-05 | Disposition: A | Discharge status: Home / Self Care | Payer: PPO | Source: Ambulatory Visit | Attending: Orthopedic Surgery | Admitting: Orthopedic Surgery

## 2022-11-05 ENCOUNTER — Encounter (HOSPITAL_COMMUNITY): Payer: Self-pay

## 2022-11-05 VITALS — BP 113/66 | HR 68 | Temp 98.4°F | Ht 64.0 in | Wt 139.0 lb

## 2022-11-05 DIAGNOSIS — I10 Essential (primary) hypertension: Secondary | ICD-10-CM | POA: Diagnosis not present

## 2022-11-05 DIAGNOSIS — R9431 Abnormal electrocardiogram [ECG] [EKG]: Secondary | ICD-10-CM | POA: Diagnosis not present

## 2022-11-05 DIAGNOSIS — Z01818 Encounter for other preprocedural examination: Secondary | ICD-10-CM | POA: Insufficient documentation

## 2022-11-05 LAB — BASIC METABOLIC PANEL
Anion gap: 8 (ref 5–15)
BUN: 12 mg/dL (ref 8–23)
CO2: 26 mmol/L (ref 22–32)
Calcium: 8.8 mg/dL — ABNORMAL LOW (ref 8.9–10.3)
Chloride: 103 mmol/L (ref 98–111)
Creatinine, Ser: 0.96 mg/dL (ref 0.44–1.00)
GFR, Estimated: 60 mL/min — ABNORMAL LOW (ref >60)
Glucose, Bld: 103 mg/dL — ABNORMAL HIGH (ref 70–99)
Potassium: 3.9 mmol/L (ref 3.5–5.1)
Sodium: 137 mmol/L (ref 135–145)

## 2022-11-05 LAB — CBC
HCT: 31.7 % — ABNORMAL LOW (ref 36.0–46.0)
Hemoglobin: 10.2 g/dL — ABNORMAL LOW (ref 12.0–15.0)
MCH: 31 pg (ref 26.0–34.0)
MCHC: 32.2 g/dL (ref 30.0–36.0)
MCV: 96.4 fL (ref 80.0–100.0)
Platelets: 336 10*3/uL (ref 150–400)
RBC: 3.29 MIL/uL — ABNORMAL LOW (ref 3.87–5.11)
RDW: 12.8 % (ref 11.5–15.5)
WBC: 6.6 10*3/uL (ref 4.0–10.5)
nRBC: 0 % (ref 0.0–0.2)

## 2022-11-05 LAB — SURGICAL PCR SCREEN
MRSA, PCR: NEGATIVE
Staphylococcus aureus: NEGATIVE

## 2022-11-05 NOTE — Progress Notes (Addendum)
For Short Stay: COVID SWAB appointment date:  Bowel Prep reminder:   For Anesthesia: PCP - Dr. Creola Corn: Clearance: 10/14/22 Cardiologist - N/A  Chest x-ray -  EKG - 11/05/22 Stress Test -  ECHO -  Cardiac Cath -  Pacemaker/ICD device last checked: Pacemaker orders received: Device Rep notified:  Spinal Cord Stimulator: N/A  Sleep Study - N/A CPAP -   Fasting Blood Sugar - N/A Checks Blood Sugar _____ times a day Date and result of last Hgb A1c-  Last dose of GLP1 agonist- N/A GLP1 instructions:   Last dose of SGLT-2 inhibitors- N/A SGLT-2 instructions:   Blood Thinner Instructions:N/A Aspirin Instructions: Last Dose:  Activity level: Can go up a flight of stairs and activities of daily living without stopping and without chest pain and/or shortness of breath   Able to exercise without chest pain and/or shortness of breath  Anesthesia review: Hx: HTN  Patient denies shortness of breath, fever, cough and chest pain at PAT appointment   Patient verbalized understanding of instructions that were given to them at the PAT appointment. Patient was also instructed that they will need to review over the PAT instructions again at home before surgery.

## 2022-11-13 ENCOUNTER — Encounter (HOSPITAL_COMMUNITY): Payer: Self-pay | Admitting: Anesthesiology

## 2022-11-13 NOTE — Anesthesia Preprocedure Evaluation (Signed)
Anesthesia Evaluation  Patient identified by MRN, date of birth, ID band Patient awake    Reviewed: Allergy & Precautions, NPO status , Patient's Chart, lab work & pertinent test results  History of Anesthesia Complications Negative for: history of anesthetic complications  Airway Mallampati: II  TM Distance: >3 FB Neck ROM: Full    Dental  (+) Dental Advisory Given, Teeth Intact   Pulmonary neg pulmonary ROS   Pulmonary exam normal breath sounds clear to auscultation       Cardiovascular hypertension, Pt. on medications  Rhythm:Regular Rate:Normal + Systolic murmurs    Neuro/Psych  PSYCHIATRIC DISORDERS  Depression Bipolar Disorder    Neuromuscular disease (cervical radiculopathy)    GI/Hepatic ,GERD  ,,  Endo/Other  Hypothyroidism  hyperlipidemia  Renal/GU      Musculoskeletal  (+) Arthritis ,    Abdominal   Peds  Hematology  (+) Blood dyscrasia, anemia   Anesthesia Other Findings TMJ dysfunction  Reproductive/Obstetrics                             Anesthesia Physical Anesthesia Plan  ASA: 3  Anesthesia Plan: General   Post-op Pain Management: Tylenol PO (pre-op)* and Regional block*   Induction: Intravenous  PONV Risk Score and Plan: 3 and Ondansetron, Dexamethasone and Treatment may vary due to age or medical condition  Airway Management Planned: Oral ETT  Additional Equipment: None and ClearSight  Intra-op Plan:   Post-operative Plan: Extubation in OR  Informed Consent: I have reviewed the patients History and Physical, chart, labs and discussed the procedure including the risks, benefits and alternatives for the proposed anesthesia with the patient or authorized representative who has indicated his/her understanding and acceptance.     Dental advisory given  Plan Discussed with: CRNA  Anesthesia Plan Comments: (Pt. Seen in preop and I discovered a harsh systolic  murmur that is heard in her carotids. She denies any DOE, but she has never had an echo. Looking at her prior surgery in 2022 she had significant hypotension after induction. Although she does not endorse any DOE, orthopnea or PND, she stated that she also has a DNR and wishes not to be resuscitated. Based on all of these factors I explained to the patient that with a potentially life threatening issue (systolic murmur aortic sclerosis vs. Stenosis), prior history of instability post induction ~ 2 years ago, and her desire to continue DNR intra-op I do no feel comfortable taking her to the OR without further workup. )        Anesthesia Quick Evaluation

## 2022-11-14 ENCOUNTER — Encounter (HOSPITAL_COMMUNITY)
Admission: RE | Disposition: A | Discharge status: Home / Self Care | Payer: Self-pay | Source: Ambulatory Visit | Attending: Orthopedic Surgery

## 2022-11-14 ENCOUNTER — Ambulatory Visit (HOSPITAL_COMMUNITY)
Admission: RE | Admit: 2022-11-14 | Discharge: 2022-11-14 | Disposition: A | Discharge status: Home / Self Care | Payer: PPO | Source: Ambulatory Visit | Attending: Orthopedic Surgery | Admitting: Orthopedic Surgery

## 2022-11-14 ENCOUNTER — Encounter (HOSPITAL_COMMUNITY): Payer: Self-pay | Admitting: Orthopedic Surgery

## 2022-11-14 DIAGNOSIS — E785 Hyperlipidemia, unspecified: Secondary | ICD-10-CM | POA: Insufficient documentation

## 2022-11-14 DIAGNOSIS — E039 Hypothyroidism, unspecified: Secondary | ICD-10-CM | POA: Insufficient documentation

## 2022-11-14 DIAGNOSIS — Z538 Procedure and treatment not carried out for other reasons: Secondary | ICD-10-CM | POA: Insufficient documentation

## 2022-11-14 DIAGNOSIS — I10 Essential (primary) hypertension: Secondary | ICD-10-CM | POA: Insufficient documentation

## 2022-11-14 DIAGNOSIS — M75102 Unspecified rotator cuff tear or rupture of left shoulder, not specified as traumatic: Secondary | ICD-10-CM | POA: Diagnosis not present

## 2022-11-14 DIAGNOSIS — K219 Gastro-esophageal reflux disease without esophagitis: Secondary | ICD-10-CM | POA: Diagnosis not present

## 2022-11-14 DIAGNOSIS — R011 Cardiac murmur, unspecified: Secondary | ICD-10-CM

## 2022-11-14 SURGERY — ARTHROPLASTY, SHOULDER, TOTAL, REVERSE
Anesthesia: General | Site: Shoulder | Laterality: Left

## 2022-11-14 MED ORDER — PHENYLEPHRINE 80 MCG/ML (10ML) SYRINGE FOR IV PUSH (FOR BLOOD PRESSURE SUPPORT)
PREFILLED_SYRINGE | INTRAVENOUS | Status: AC
  Filled 2022-11-14: qty 10

## 2022-11-14 MED ORDER — ROCURONIUM BROMIDE 10 MG/ML (PF) SYRINGE
PREFILLED_SYRINGE | INTRAVENOUS | Status: AC
  Filled 2022-11-14: qty 10

## 2022-11-14 MED ORDER — PROPOFOL 10 MG/ML IV BOLUS
INTRAVENOUS | Status: AC
  Filled 2022-11-14: qty 20

## 2022-11-14 MED ORDER — LACTATED RINGERS IV SOLN: INTRAVENOUS | Status: DC

## 2022-11-14 MED ORDER — TRANEXAMIC ACID 1000 MG/10ML IV SOLN: 1000.0000 mg | INTRAVENOUS | Status: DC

## 2022-11-14 MED ORDER — ONDANSETRON HCL 4 MG/2ML IJ SOLN
INTRAMUSCULAR | Status: AC
  Filled 2022-11-14: qty 2

## 2022-11-14 MED ORDER — DIPHENHYDRAMINE HCL 50 MG/ML IJ SOLN
INTRAMUSCULAR | Status: AC
  Filled 2022-11-14: qty 1

## 2022-11-14 MED ORDER — VANCOMYCIN HCL 1000 MG IV SOLR
INTRAVENOUS | Status: AC
  Filled 2022-11-14: qty 20

## 2022-11-14 MED ORDER — LIDOCAINE HCL (PF) 2 % IJ SOLN
INTRAMUSCULAR | Status: AC
  Filled 2022-11-14: qty 5

## 2022-11-14 MED ORDER — CHLORHEXIDINE GLUCONATE 0.12 % MT SOLN
15.0000 mL | Freq: Once | OROMUCOSAL | Status: AC
  Administered 2022-11-14: 15 mL via OROMUCOSAL

## 2022-11-14 MED ORDER — TRANEXAMIC ACID-NACL 1000-0.7 MG/100ML-% IV SOLN
1000.0000 mg | INTRAVENOUS | Status: DC
  Filled 2022-11-14: qty 100

## 2022-11-14 MED ORDER — CEFAZOLIN SODIUM-DEXTROSE 2-4 GM/100ML-% IV SOLN
2.0000 g | INTRAVENOUS | Status: DC
  Filled 2022-11-14: qty 100

## 2022-11-14 MED ORDER — PHENYLEPHRINE HCL-NACL 20-0.9 MG/250ML-% IV SOLN
INTRAVENOUS | Status: AC
  Filled 2022-11-14: qty 250

## 2022-11-14 MED ORDER — DEXAMETHASONE SODIUM PHOSPHATE 10 MG/ML IJ SOLN
INTRAMUSCULAR | Status: AC
  Filled 2022-11-14: qty 1

## 2022-11-14 MED ORDER — ORAL CARE MOUTH RINSE: 15.0000 mL | Freq: Once | OROMUCOSAL | Status: AC

## 2022-11-14 MED ORDER — ACETAMINOPHEN 500 MG PO TABS
1000.0000 mg | ORAL_TABLET | Freq: Once | ORAL | Status: AC
  Administered 2022-11-14: 1000 mg via ORAL
  Filled 2022-11-14: qty 2

## 2022-11-14 MED ORDER — FENTANYL CITRATE (PF) 100 MCG/2ML IJ SOLN
INTRAMUSCULAR | Status: AC
  Filled 2022-11-14: qty 2

## 2022-11-14 NOTE — Progress Notes (Signed)
Anesthesia note: Pt. Seen in preop and I discovered a harsh systolic murmur that is heard in her carotids. She denies any DOE, but she has never had an echo. Looking at her prior surgery in 2022 she had significant hypotension after induction. Although she does not endorse any DOE, orthopnea or PND, she stated that she also has a DNR and wishes not to be resuscitated. Based on all of these factors I explained to the patient that with a potentially life threatening issue (systolic murmur aortic sclerosis vs. Stenosis), prior history of instability post induction ~ 2 years ago, and her desire to continue DNR intra-op I do no feel comfortable taking her to the OR without further workup. The total time I spent with the patient this morning was in excess of 30 minutes.

## 2022-11-14 NOTE — H&P (Signed)
Theresa Underwood    Chief Complaint: Left shoulder rotator cuff tear arthropathy HPI: The patient is a 82 y.o. female with chronic and progressively increasing left shoulder pain related to severe rotator cuff tear arthropathy.  Due to her increasing functional limitations and failure to respond to prolonged attempts of conservative management, she is brought to the operating room at this time for planned left shoulder reverse arthroplasty  Past Medical History:  Diagnosis Date   Anemia    Arthritis    Bipolar disorder (HCC)    Burning pain    where bladder pushes thru vaginal wall   Cholelithiasis    Colon polyps    Complication of anesthesia    slight trouble turning head side to side   Contusion of right great toe with damage to nail    right toenail black for months   Cystocele    DDD (degenerative disc disease)    Depression    Diverticulosis    Ganglion cyst of right foot    due to bone spurs   GERD (gastroesophageal reflux disease)    Glucose intolerance (impaired glucose tolerance)    Hemorrhoids    History of bladder infections    History of TMJ disorder    no problems in years, wears bite guard when tmj occurs   HOH (hard of hearing)    left more than right, mild   Hyperlipidemia    Hypertension    Hypothyroidism    Peripheral neuropathy    bilateral feet, Dr. Allena Katz   Prolapsed bladder    Spinal stenosis    Tinnitus    left ear drum beat      Past Surgical History:  Procedure Laterality Date   ABDOMINAL HYSTERECTOMY  2002   total   BILIARY DILATION  08/03/2020   Procedure: BILIARY DILATION;  Surgeon: Meryl Dare, MD;  Location: [facility] ENDOSCOPY;  Service: Endoscopy;;   CHOLECYSTECTOMY N/A 08/01/2020   Procedure: LAPAROSCOPIC CHOLECYSTECTOMY WITH INTRAOPERATIVE CHOLANGIOGRAM;  Surgeon: Axel Filler, MD;  Location: [facility] OR;  Service: General;  Laterality: N/A;   CYSTOCELE REPAIR N/A 12/05/2015   Procedure: CYSTOSCOPY, ANTERIOR REPAIR (CYSTOCELE), VAULT  PROLAPSE AND GRAFT;  Surgeon: Alfredo Martinez, MD;  Location: [facility] ORS;  Service: Urology;  Laterality: N/A;   ENDOSCOPIC RETROGRADE CHOLANGIOPANCREATOGRAPHY (ERCP) WITH PROPOFOL N/A 08/03/2020   Procedure: ENDOSCOPIC RETROGRADE CHOLANGIOPANCREATOGRAPHY (ERCP) WITH PROPOFOL;  Surgeon: Meryl Dare, MD;  Location: [facility] ENDOSCOPY;  Service: Endoscopy;  Laterality: N/A;   REMOVAL OF STONES  08/03/2020   Procedure: REMOVAL OF STONES;  Surgeon: Meryl Dare, MD;  Location: [facility] ENDOSCOPY;  Service: Endoscopy;;   SPHINCTEROTOMY  08/03/2020   Procedure: Dennison Mascot;  Surgeon: Meryl Dare, MD;  Location: [facility] ENDOSCOPY;  Service: Endoscopy;;   TONSILLECTOMY AND ADENOIDECTOMY     as a child   TOTAL KNEE ARTHROPLASTY     bi-lateral 2000,2007   TUBAL LIGATION  1976   VAGINAL PROLAPSE REPAIR N/A 12/05/2015   Procedure: VAGINAL VAULT SUSPENSION;  Surgeon: Alfredo Martinez, MD;  Location: [facility] ORS;  Service: Urology;  Laterality: N/A;    Family History  Problem Relation Age of Onset   Bipolar disorder Mother    Osteoporosis Mother    Colon polyps Mother    Stroke Mother        Deceased, 69   Neuropathy Mother    Peripheral vascular disease Maternal Grandmother    Pancreatic cancer Maternal Grandmother    Colon polyps Father    Lung disease Father  Deceased, 56   Diverticulitis Sister    Neuropathy Maternal Grandfather    Colon cancer Neg Hx    Breast cancer Neg Hx     Social History:  reports that she has never smoked. She has never used smokeless tobacco. She reports current alcohol use. She reports that she does not use drugs.  BMI: Estimated body mass index is 23.86 kg/m as calculated from the following:   Height as of 11/05/22: 5\' 4"  (1.626 m).   Weight as of 11/05/22: 63 kg.  Lab Results  Component Value Date   ALBUMIN 2.6 (L) 08/04/2020   Diabetes: Patient does not have a diagnosis of diabetes.     Smoking Status:   reports that she has never smoked. She has never  used smokeless tobacco.     Medications Prior to Admission  Medication Sig Dispense Refill   acetaminophen (TYLENOL) 500 MG tablet Take 2 tablets (1,000 mg total) by mouth every 6 (six) hours. 30 tablet 0   BERBERINE CHLORIDE PO Take 1 capsule by mouth in the morning and at bedtime.     Cholecalciferol (D3 VITAMIN PO) Take 1 tablet by mouth daily.     clonazePAM (KLONOPIN) 0.5 MG tablet Take 0.5 mg by mouth 2 (two) times daily as needed for anxiety.     gabapentin (NEURONTIN) 600 MG tablet TAKE 1 TABLET(600 MG) BY MOUTH THREE TIMES . OK to take extra dose as needed. 290 tablet 3   hydrocortisone (ANUSOL-HC) 2.5 % rectal cream Place 1 application rectally 3 (three) times daily. (Patient taking differently: Place 1 application  rectally daily as needed for anal itching (IBS irritation).) 30 g 1   hyoscyamine (LEVSIN SL) 0.125 MG SL tablet Place 1 tablet (0.125 mg total) under the tongue every 6 (six) hours as needed. (Patient taking differently: Place 0.125 mg under the tongue every 6 (six) hours as needed for cramping.) 30 tablet 4   ibuprofen (ADVIL) 200 MG tablet Take 200-400 mg by mouth every 6 (six) hours as needed for moderate pain.     lamoTRIgine (LAMICTAL) 200 MG tablet Take 200 mg by mouth daily.     levothyroxine (SYNTHROID, LEVOTHROID) 112 MCG tablet Take 112 mcg by mouth at bedtime.     lisinopril (PRINIVIL,ZESTRIL) 10 MG tablet Take 10 mg by mouth every Monday, Wednesday, and Friday.     clobetasol cream (TEMOVATE) 0.05 % Apply 1 application  topically daily as needed (rash).     OVER THE COUNTER MEDICATION Take 1 tablet by mouth in the morning, at noon, and at bedtime. D-Mannose       Physical Exam: Left shoulder demonstrates painful and guarded motion as noted at her recent office visits.  She has globally decreased strength to manual muscle testing.  Neurovascular intact in left upper extremity.  Imaging studies confirmed changes consistent with chronic severe left shoulder  rotator cuff tear arthropathy.  Vitals  Temp:  [98.5 F (36.9 C)] 98.5 F (36.9 C) (05/30 0554) Pulse Rate:  [67] 67 (05/30 0554) Resp:  [17] 17 (05/30 0554) BP: (127)/(75) 127/75 (05/30 0554) SpO2:  [98 %] 98 % (05/30 0554)  Assessment/Plan  Impression: Left shoulder rotator cuff tear arthropathy  Plan of Action: Procedure(s): REVERSE SHOULDER ARTHROPLASTY  Theresa Underwood M Thimothy Barretta 11/14/2022, 6:09 AM Contact # [phone]

## 2022-11-18 ENCOUNTER — Other Ambulatory Visit: Payer: Self-pay | Admitting: Internal Medicine

## 2022-11-18 ENCOUNTER — Ambulatory Visit: Payer: PPO

## 2022-11-18 DIAGNOSIS — I10 Essential (primary) hypertension: Secondary | ICD-10-CM | POA: Diagnosis not present

## 2022-11-18 DIAGNOSIS — R011 Cardiac murmur, unspecified: Secondary | ICD-10-CM

## 2022-11-25 ENCOUNTER — Encounter (HOSPITAL_COMMUNITY): Payer: Self-pay

## 2022-12-24 DIAGNOSIS — N390 Urinary tract infection, site not specified: Secondary | ICD-10-CM | POA: Diagnosis not present

## 2023-01-15 DIAGNOSIS — F3132 Bipolar disorder, current episode depressed, moderate: Secondary | ICD-10-CM | POA: Diagnosis not present

## 2023-01-20 ENCOUNTER — Other Ambulatory Visit (HOSPITAL_COMMUNITY): Payer: PPO

## 2023-01-21 NOTE — Patient Instructions (Signed)
SURGICAL WAITING ROOM VISITATION  Patients having surgery or a procedure may have no more than 2 support people in the waiting area - these visitors may rotate.    Children under the age of 58 must have an adult with them who is not the patient.  Due to an increase in RSV and influenza rates and associated hospitalizations, children ages 92 and under may not visit patients in [facility] hospitals.  If the patient needs to stay at the hospital during part of their recovery, the visitor guidelines for inpatient rooms apply. Pre-op nurse will coordinate an appropriate time for 1 support person to accompany patient in pre-op.  This support person may not rotate.    Please refer to the [facility] website for the visitor guidelines for Inpatients (after your surgery is over and you are in a regular room).       Your procedure is scheduled on:  01/30/2023    Report to [facility] Main Entrance    Report to admitting at   1000AM   Call this number if you have problems the morning of surgery [phone]   Do not eat food :After Midnight.   After Midnight you may have the following liquids until __ 0930____ AM/ PM DAY OF SURGERY  Water Non-Citrus Juices (without pulp, NO RED-Apple, White grape, White cranberry) Black Coffee (NO MILK/CREAM OR CREAMERS, sugar ok)  Clear Tea (NO MILK/CREAM OR CREAMERS, sugar ok) regular and decaf                             Plain Jell-O (NO RED)                                           Fruit ices (not with fruit pulp, NO RED)                                     Popsicles (NO RED)                                                               Sports drinks like Gatorade (NO RED)                   The day of surgery:  Drink ONE (1) Pre-Surgery Clear Ensure or G2 at  0930AM  ( have completed by ) the morning of surgery. Drink in one sitting. Do not sip.  This drink was given to you during your hospital  pre-op appointment visit. Nothing else to  drink after completing the  Pre-Surgery Clear Ensure or G2.          If you have questions, please contact your surgeon's office.  INSTRUCTIONS YOU RECEIVED FROM YOUR SURGEON'S OFFICE!!!     Oral Hygiene is also important to reduce your risk of infection.                                    Remember - BRUSH YOUR TEETH  THE MORNING OF SURGERY WITH YOUR REGULAR TOOTHPASTE  DENTURES WILL BE REMOVED PRIOR TO SURGERY PLEASE DO NOT APPLY "Poly grip" OR ADHESIVES!!!   Do NOT smoke after Midnight   Stop all vitamins and herbal supplements 7 days before surgery.   Take these medicines the morning of surgery with A SIP OF WATER:  gabapentin, synthroid, lamictal   DO NOT TAKE ANY ORAL DIABETIC MEDICATIONS DAY OF YOUR SURGERY  Bring CPAP mask and tubing day of surgery.                              You may not have any metal on your body including hair pins, jewelry, and body piercing             Do not wear make-up, lotions, powders, perfumes/cologne, or deodorant  Do not wear nail polish including gel and S&S, artificial/acrylic nails, or any other type of covering on natural nails including finger and toenails. If you have artificial nails, gel coating, etc. that needs to be removed by a nail salon please have this removed prior to surgery or surgery may need to be canceled/ delayed if the surgeon/ anesthesia feels like they are unable to be safely monitored.   Do not shave  48 hours prior to surgery.               Men may shave face and neck.   Do not bring valuables to the hospital. [facility] IS NOT             RESPONSIBLE   FOR VALUABLES.   Contacts, glasses, dentures or bridgework may not be worn into surgery.   Bring small overnight bag day of surgery.   DO NOT BRING YOUR HOME MEDICATIONS TO THE HOSPITAL. PHARMACY WILL DISPENSE MEDICATIONS LISTED ON YOUR MEDICATION LIST TO YOU DURING YOUR ADMISSION IN THE HOSPITAL!    Patients discharged on the day of surgery will not be allowed  to drive home.  Someone NEEDS to stay with you for the first 24 hours after anesthesia.   Special Instructions: Bring a copy of your healthcare power of attorney and living will documents the day of surgery if you haven't scanned them before.              Please read over the following fact sheets you were given: IF YOU HAVE QUESTIONS ABOUT YOUR PRE-OP INSTRUCTIONS PLEASE CALL [phone]   If you received a COVID test during your pre-op visit  it is requested that you wear a mask when out in public, stay away from anyone that may not be feeling well and notify your surgeon if you develop symptoms. If you test positive for Covid or have been in contact with anyone that has tested positive in the last 10 days please notify you surgeon.      Pre-operative 5 CHG Bath Instructions   You can play a key role in reducing the risk of infection after surgery. Your skin needs to be as free of germs as possible. You can reduce the number of germs on your skin by washing with CHG (chlorhexidine gluconate) soap before surgery. CHG is an antiseptic soap that kills germs and continues to kill germs even after washing.   DO NOT use if you have an allergy to chlorhexidine/CHG or antibacterial soaps. If your skin becomes reddened or irritated, stop using the CHG and notify one of our RNs at [phone].  Please shower with the CHG soap starting 4 days before surgery using the following schedule:     Please keep in mind the following:  DO NOT shave, including legs and underarms, starting the day of your first shower.   You may shave your face at any point before/day of surgery.  Place clean sheets on your bed the day you start using CHG soap. Use a clean washcloth (not used since being washed) for each shower. DO NOT sleep with pets once you start using the CHG.   CHG Shower Instructions:  If you choose to wash your hair and private area, wash first with your normal shampoo/soap.  After you use  shampoo/soap, rinse your hair and body thoroughly to remove shampoo/soap residue.  Turn the water OFF and apply about 3 tablespoons (45 ml) of CHG soap to a CLEAN washcloth.  Apply CHG soap ONLY FROM YOUR NECK DOWN TO YOUR TOES (washing for 3-5 minutes)  DO NOT use CHG soap on face, private areas, open wounds, or sores.  Pay special attention to the area where your surgery is being performed.  If you are having back surgery, having someone wash your back for you may be helpful. Wait 2 minutes after CHG soap is applied, then you may rinse off the CHG soap.  Pat dry with a clean towel  Put on clean clothes/pajamas   If you choose to wear lotion, please use ONLY the CHG-compatible lotions on the back of this paper.     Additional instructions for the day of surgery: DO NOT APPLY any lotions, deodorants, cologne, or perfumes.   Put on clean/comfortable clothes.  Brush your teeth.  Ask your nurse before applying any prescription medications to the skin.      CHG Compatible Lotions   Aveeno Moisturizing lotion  Cetaphil Moisturizing Cream  Cetaphil Moisturizing Lotion  Clairol Herbal Essence Moisturizing Lotion, Dry Skin  Clairol Herbal Essence Moisturizing Lotion, Extra Dry Skin  Clairol Herbal Essence Moisturizing Lotion, Normal Skin  Curel Age Defying Therapeutic Moisturizing Lotion with Alpha Hydroxy  Curel Extreme Care Body Lotion  Curel Soothing Hands Moisturizing Hand Lotion  Curel Therapeutic Moisturizing Cream, Fragrance-Free  Curel Therapeutic Moisturizing Lotion, Fragrance-Free  Curel Therapeutic Moisturizing Lotion, Original Formula  Eucerin Daily Replenishing Lotion  Eucerin Dry Skin Therapy Plus Alpha Hydroxy Crme  Eucerin Dry Skin Therapy Plus Alpha Hydroxy Lotion  Eucerin Original Crme  Eucerin Original Lotion  Eucerin Plus Crme Eucerin Plus Lotion  Eucerin TriLipid Replenishing Lotion  Keri Anti-Bacterial Hand Lotion  Keri Deep Conditioning Original Lotion Dry  Skin Formula Softly Scented  Keri Deep Conditioning Original Lotion, Fragrance Free Sensitive Skin Formula  Keri Lotion Fast Absorbing Fragrance Free Sensitive Skin Formula  Keri Lotion Fast Absorbing Softly Scented Dry Skin Formula  Keri Original Lotion  Keri Skin Renewal Lotion Keri Silky Smooth Lotion  Keri Silky Smooth Sensitive Skin Lotion  Nivea Body Creamy Conditioning Oil  Nivea Body Extra Enriched Lotion  Nivea Body Original Lotion  Nivea Body Sheer Moisturizing Lotion Nivea Crme  Nivea Skin Firming Lotion  NutraDerm 30 Skin Lotion  NutraDerm Skin Lotion  NutraDerm Therapeutic Skin Cream  NutraDerm Therapeutic Skin Lotion  ProShield Protective Hand Cream  Provon moisturizing lotion    [facility] [place]- Preparing for Total Shoulder Arthroplasty    Before surgery, you can play an important role. Because skin is not sterile, your skin needs to be as free of germs as possible. You can reduce the number of germs on your  skin by using the following products. Benzoyl Peroxide Gel Reduces the number of germs present on the skin Applied twice a day to shoulder area starting two days before surgery    ==================================================================  Please follow these instructions carefully:  BENZOYL PEROXIDE 5% GEL  Please do not use if you have an allergy to benzoyl peroxide.   If your skin becomes reddened/irritated stop using the benzoyl peroxide.  Starting two days before surgery, apply as follows: Apply benzoyl peroxide in the morning and at night. Apply after taking a shower. If you are not taking a shower clean entire shoulder front, back, and side along with the armpit with a clean wet washcloth.  Place a quarter-sized dollop on your shoulder and rub in thoroughly, making sure to cover the front, back, and side of your shoulder, along with the armpit.   2 days before ____ AM   ____ PM              1 day before ____ AM   ____ PM                          Do this twice a day for two days.  (Last application is the night before surgery, AFTER using the CHG soap as described below).  Do NOT apply benzoyl peroxide gel on the day of surgery.

## 2023-01-21 NOTE — Progress Notes (Addendum)
Anesthesia Review:  PCP: Domenic Polite - clearance 10/14/22 on chasrt  Cardiologist : Neuro- DR Allena Katz- LOv 05/13/22  Chest x-ray : EKG : 11/05/22  Echo : 11/18/22  Stress test: Cardiac Cath :  Activity level:  Sleep Study/ CPAP : Fasting Blood Sugar :      / Checks Blood Sugar -- times a day:   Blood Thinner/ Instructions /Last Dose: ASA / Instructions/ Last Dose :    11/14/22- Surgery cancelled needed workup

## 2023-01-22 ENCOUNTER — Encounter (HOSPITAL_COMMUNITY): Payer: Self-pay

## 2023-01-22 ENCOUNTER — Other Ambulatory Visit: Payer: Self-pay

## 2023-01-22 ENCOUNTER — Encounter (HOSPITAL_COMMUNITY)
Admission: RE | Admit: 2023-01-22 | Discharge: 2023-01-22 | Disposition: A | Discharge status: Home / Self Care | Payer: PPO | Source: Ambulatory Visit | Attending: Orthopedic Surgery | Admitting: Orthopedic Surgery

## 2023-01-22 VITALS — BP 144/79 | HR 64 | Temp 97.9°F | Resp 16 | Ht 65.0 in

## 2023-01-22 DIAGNOSIS — E039 Hypothyroidism, unspecified: Secondary | ICD-10-CM | POA: Insufficient documentation

## 2023-01-22 DIAGNOSIS — I10 Essential (primary) hypertension: Secondary | ICD-10-CM | POA: Diagnosis not present

## 2023-01-22 DIAGNOSIS — Z01818 Encounter for other preprocedural examination: Secondary | ICD-10-CM

## 2023-01-22 DIAGNOSIS — Z01812 Encounter for preprocedural laboratory examination: Secondary | ICD-10-CM | POA: Insufficient documentation

## 2023-01-22 LAB — CBC
HCT: 31.9 % — ABNORMAL LOW (ref 36.0–46.0)
Hemoglobin: 10.4 g/dL — ABNORMAL LOW (ref 12.0–15.0)
MCH: 31.1 pg (ref 26.0–34.0)
MCHC: 32.6 g/dL (ref 30.0–36.0)
MCV: 95.5 fL (ref 80.0–100.0)
Platelets: 354 10*3/uL (ref 150–400)
RBC: 3.34 MIL/uL — ABNORMAL LOW (ref 3.87–5.11)
RDW: 13 % (ref 11.5–15.5)
WBC: 7.5 10*3/uL (ref 4.0–10.5)
nRBC: 0 % (ref 0.0–0.2)

## 2023-01-22 LAB — BASIC METABOLIC PANEL
Anion gap: 10 (ref 5–15)
BUN: 18 mg/dL (ref 8–23)
CO2: 25 mmol/L (ref 22–32)
Calcium: 9.1 mg/dL (ref 8.9–10.3)
Chloride: 100 mmol/L (ref 98–111)
Creatinine, Ser: 1.18 mg/dL — ABNORMAL HIGH (ref 0.44–1.00)
GFR, Estimated: 46 mL/min — ABNORMAL LOW (ref >60)
Glucose, Bld: 111 mg/dL — ABNORMAL HIGH (ref 70–99)
Potassium: 4.3 mmol/L (ref 3.5–5.1)
Sodium: 135 mmol/L (ref 135–145)

## 2023-01-22 LAB — SURGICAL PCR SCREEN
MRSA, PCR: NEGATIVE
Staphylococcus aureus: NEGATIVE

## 2023-01-24 ENCOUNTER — Encounter (HOSPITAL_COMMUNITY): Payer: Self-pay

## 2023-01-24 NOTE — Anesthesia Preprocedure Evaluation (Signed)
Anesthesia Evaluation  Patient identified by MRN, date of birth, ID band Patient awake    Reviewed: Allergy & Precautions, NPO status , Patient's Chart, lab work & pertinent test results  History of Anesthesia Complications (+) history of anesthetic complications  Airway Mallampati: II  TM Distance: >3 FB Neck ROM: Limited    Dental no notable dental hx. (+) Dental Advisory Given, Teeth Intact, Caps   Pulmonary neg pulmonary ROS   Pulmonary exam normal        Cardiovascular hypertension, Pt. on medications Normal cardiovascular exam Rhythm:Regular Rate:Normal     Neuro/Psych  PSYCHIATRIC DISORDERS  Depression Bipolar Disorder   Peripheral neuropathy HOH  Neuromuscular disease    GI/Hepatic Neg liver ROS,GERD  Medicated,,  Endo/Other  Hypothyroidism  Glucose intolerance  Renal/GU negative Renal ROS  negative genitourinary   Musculoskeletal  (+) Arthritis , Osteoarthritis,  Spinal stenosis Left shoulder rotator cuff arthropathy   Abdominal   Peds  Hematology  (+) Blood dyscrasia, anemia   Anesthesia Other Findings   Reproductive/Obstetrics                             Anesthesia Physical Anesthesia Plan  ASA: 3  Anesthesia Plan: General   Post-op Pain Management: Regional block* and Minimal or no pain anticipated   Induction: Intravenous  PONV Risk Score and Plan: 4 or greater and Treatment may vary due to age or medical condition, Ondansetron and Dexamethasone  Airway Management Planned: Oral ETT  Additional Equipment: None  Intra-op Plan:   Post-operative Plan: Extubation in OR  Informed Consent: I have reviewed the patients History and Physical, chart, labs and discussed the procedure including the risks, benefits and alternatives for the proposed anesthesia with the patient or authorized representative who has indicated his/her understanding and acceptance.   Patient has  DNR.  Discussed DNR with patient and Suspend DNR.   Dental advisory given  Plan Discussed with: CRNA and Anesthesiologist  Anesthesia Plan Comments: (See PAT note from 8/7 by Sherlie Ban PA-C )        Anesthesia Quick Evaluation

## 2023-01-24 NOTE — Progress Notes (Signed)
Choose an anesthesia record to view details        DISCUSSION: Theresa Underwood is an 82 yo female who presents to PAT prior to reverse shoulder arthoplasty with Dr. Rennis Chris on 01/30/23. Previous surgery was canceled due to anesthesia team hearing a harsh systolic murmur. PMH significant for HTN, bipolar d/o, hypothyroidism, GERD, anemia.  No prior anesthesia complications.  Echo was obtained by PCP on 11/18/22. Paper copy of impression is in chart which reads:  Hyperdynamic LV systolic function with visual EF greater than 70%.  Left ventricle cavity is normal in size.  Normal global wall motion. Indeterminate diastolic filling pattern, normal LAP, moderate LVH. Left atrial cavity is moderately dilated at 45.06 mL/meters squared. Aortic valve sclerosis without stenosis. Native mitral valve with trace regurgitation. Vantrigt cuspid regurgitation, no evidence of pulmonary hypertension. No prior study for comparison  Per Dr. Timothy Lasso (PCP): "Echo was relatively normal.  I told this to the patient.  No reason to delay surgery.  Okay to forward to Dr. Rennis Chris."   VS: BP (!) 144/79   Pulse 64   Temp 36.6 C (Oral)   Resp 16   Ht 5\' 5"  (1.651 m)   SpO2 98%   BMI 23.13 kg/m   PROVIDERS: Creola Corn, MD   LABS: Labs reviewed: Acceptable for surgery. (all labs ordered are listed, but only abnormal results are displayed)  Labs Reviewed  CBC - Abnormal; Notable for the following components:      Result Value   RBC 3.34 (*)    Hemoglobin 10.4 (*)    HCT 31.9 (*)    All other components within normal limits  BASIC METABOLIC PANEL - Abnormal; Notable for the following components:   Glucose, Bld 111 (*)    Creatinine, Ser 1.18 (*)    GFR, Estimated 46 (*)    All other components within normal limits  SURGICAL PCR SCREEN     IMAGES:   EKG:   CV:  Echo 11/18/22  Hyperdynamic LV systolic function with visual EF greater than 70%.  Left ventricle cavity is normal in size.  Normal  global wall motion. Indeterminate diastolic filling pattern, normal LAP, moderate LVH. Left atrial cavity is moderately dilated at 45.06 mL/meters squared. Aortic valve sclerosis without stenosis. Native mitral valve with trace regurgitation. Vantrigt cuspid regurgitation, no evidence of pulmonary hypertension. No prior study for comparison  Past Medical History:  Diagnosis Date   Anemia    Arthritis    Bipolar disorder (HCC)    Burning pain    where bladder pushes thru vaginal wall   Cholelithiasis    Colon polyps    Complication of anesthesia    slight trouble turning head side to side   Contusion of right great toe with damage to nail    right toenail black for months   Cystocele    DDD (degenerative disc disease)    Depression    Diverticulosis    Ganglion cyst of right foot    due to bone spurs   GERD (gastroesophageal reflux disease)    Glucose intolerance (impaired glucose tolerance)    Hemorrhoids    History of bladder infections    History of TMJ disorder    no problems in years, wears bite guard when tmj occurs   HOH (hard of hearing)    left more than right, mild   Hyperlipidemia    Hypertension    Hypothyroidism    Peripheral neuropathy    bilateral feet, Dr. Allena Katz  Prolapsed bladder    Spinal stenosis    Tinnitus    left ear drum beat    Past Surgical History:  Procedure Laterality Date   ABDOMINAL HYSTERECTOMY  2002   total   BILIARY DILATION  08/03/2020   Procedure: BILIARY DILATION;  Surgeon: Meryl Dare, MD;  Location: [facility] ENDOSCOPY;  Service: Endoscopy;;   CHOLECYSTECTOMY N/A 08/01/2020   Procedure: LAPAROSCOPIC CHOLECYSTECTOMY WITH INTRAOPERATIVE CHOLANGIOGRAM;  Surgeon: Axel Filler, MD;  Location: [facility] OR;  Service: General;  Laterality: N/A;   CYSTOCELE REPAIR N/A 12/05/2015   Procedure: CYSTOSCOPY, ANTERIOR REPAIR (CYSTOCELE), VAULT PROLAPSE AND GRAFT;  Surgeon: Alfredo Martinez, MD;  Location: [facility] ORS;  Service: Urology;  Laterality:  N/A;   ENDOSCOPIC RETROGRADE CHOLANGIOPANCREATOGRAPHY (ERCP) WITH PROPOFOL N/A 08/03/2020   Procedure: ENDOSCOPIC RETROGRADE CHOLANGIOPANCREATOGRAPHY (ERCP) WITH PROPOFOL;  Surgeon: Meryl Dare, MD;  Location: [facility] ENDOSCOPY;  Service: Endoscopy;  Laterality: N/A;   REMOVAL OF STONES  08/03/2020   Procedure: REMOVAL OF STONES;  Surgeon: Meryl Dare, MD;  Location: [facility] ENDOSCOPY;  Service: Endoscopy;;   SPHINCTEROTOMY  08/03/2020   Procedure: Dennison Mascot;  Surgeon: Meryl Dare, MD;  Location: [facility] ENDOSCOPY;  Service: Endoscopy;;   TONSILLECTOMY AND ADENOIDECTOMY     as a child   TOTAL KNEE ARTHROPLASTY     bi-lateral 2000,2007   TUBAL LIGATION  1976   VAGINAL PROLAPSE REPAIR N/A 12/05/2015   Procedure: VAGINAL VAULT SUSPENSION;  Surgeon: Alfredo Martinez, MD;  Location: [facility] ORS;  Service: Urology;  Laterality: N/A;    MEDICATIONS:  acetaminophen (TYLENOL) 500 MG tablet   BERBERINE CHLORIDE PO   Cholecalciferol (D3 VITAMIN PO)   clonazePAM (KLONOPIN) 0.5 MG tablet   gabapentin (NEURONTIN) 600 MG tablet   hydrocortisone (ANUSOL-HC) 2.5 % rectal cream   hyoscyamine (LEVSIN SL) 0.125 MG SL tablet   ibuprofen (ADVIL) 200 MG tablet   lamoTRIgine (LAMICTAL) 200 MG tablet   levothyroxine (SYNTHROID, LEVOTHROID) 112 MCG tablet   lisinopril (PRINIVIL,ZESTRIL) 10 MG tablet   Multiple Vitamins-Minerals (WOMENS MULTIVITAMIN PO)   mupirocin cream (BACTROBAN) 2 %   No current facility-administered medications for this encounter.   Marcille Blanco [facility] Short Stay/Anesthesiology [facility] Phone [phone] 01/24/2023 10:17 AM

## 2023-01-30 ENCOUNTER — Ambulatory Visit (HOSPITAL_COMMUNITY)
Admission: RE | Admit: 2023-01-30 | Discharge: 2023-01-30 | Disposition: A | Discharge status: Home / Self Care | Payer: PPO | Source: Ambulatory Visit | Attending: Orthopedic Surgery | Admitting: Orthopedic Surgery

## 2023-01-30 ENCOUNTER — Encounter (HOSPITAL_COMMUNITY): Payer: Self-pay | Admitting: Orthopedic Surgery

## 2023-01-30 ENCOUNTER — Ambulatory Visit (HOSPITAL_COMMUNITY): Payer: PPO | Admitting: Medical

## 2023-01-30 ENCOUNTER — Ambulatory Visit (HOSPITAL_BASED_OUTPATIENT_CLINIC_OR_DEPARTMENT_OTHER): Payer: PPO | Admitting: Certified Registered Nurse Anesthetist

## 2023-01-30 ENCOUNTER — Other Ambulatory Visit: Payer: Self-pay

## 2023-01-30 ENCOUNTER — Encounter (HOSPITAL_COMMUNITY)
Admission: RE | Disposition: A | Discharge status: Home / Self Care | Payer: Self-pay | Source: Ambulatory Visit | Attending: Orthopedic Surgery

## 2023-01-30 DIAGNOSIS — E039 Hypothyroidism, unspecified: Secondary | ICD-10-CM

## 2023-01-30 DIAGNOSIS — M75102 Unspecified rotator cuff tear or rupture of left shoulder, not specified as traumatic: Secondary | ICD-10-CM | POA: Diagnosis not present

## 2023-01-30 DIAGNOSIS — E785 Hyperlipidemia, unspecified: Secondary | ICD-10-CM | POA: Insufficient documentation

## 2023-01-30 DIAGNOSIS — M19012 Primary osteoarthritis, left shoulder: Secondary | ICD-10-CM

## 2023-01-30 DIAGNOSIS — K219 Gastro-esophageal reflux disease without esophagitis: Secondary | ICD-10-CM | POA: Insufficient documentation

## 2023-01-30 DIAGNOSIS — Z96653 Presence of artificial knee joint, bilateral: Secondary | ICD-10-CM | POA: Diagnosis not present

## 2023-01-30 DIAGNOSIS — I10 Essential (primary) hypertension: Secondary | ICD-10-CM

## 2023-01-30 DIAGNOSIS — Z01818 Encounter for other preprocedural examination: Secondary | ICD-10-CM

## 2023-01-30 DIAGNOSIS — G8918 Other acute postprocedural pain: Secondary | ICD-10-CM | POA: Diagnosis not present

## 2023-01-30 HISTORY — PX: REVERSE SHOULDER ARTHROPLASTY: SHX5054

## 2023-01-30 SURGERY — ARTHROPLASTY, SHOULDER, TOTAL, REVERSE
Anesthesia: General | Site: Shoulder | Laterality: Left

## 2023-01-30 MED ORDER — ONDANSETRON HCL 4 MG PO TABS: 4.0000 mg | ORAL_TABLET | Freq: Three times a day (TID) | ORAL | 0 refills | Status: DC | PRN

## 2023-01-30 MED ORDER — CHLORHEXIDINE GLUCONATE 0.12 % MT SOLN: 15.0000 mL | Freq: Once | OROMUCOSAL | Status: DC

## 2023-01-30 MED ORDER — LACTATED RINGERS IV SOLN: INTRAVENOUS | Status: DC

## 2023-01-30 MED ORDER — ONDANSETRON HCL 4 MG/2ML IJ SOLN
INTRAMUSCULAR | Status: DC | PRN
  Administered 2023-01-30: 4 mg via INTRAVENOUS

## 2023-01-30 MED ORDER — LIDOCAINE HCL (CARDIAC) PF 100 MG/5ML IV SOSY
PREFILLED_SYRINGE | INTRAVENOUS | Status: DC | PRN
  Administered 2023-01-30: 60 mg via INTRAVENOUS

## 2023-01-30 MED ORDER — ROCURONIUM BROMIDE 10 MG/ML (PF) SYRINGE
PREFILLED_SYRINGE | INTRAVENOUS | Status: AC
  Filled 2023-01-30: qty 10

## 2023-01-30 MED ORDER — DEXAMETHASONE SODIUM PHOSPHATE 10 MG/ML IJ SOLN
INTRAMUSCULAR | Status: AC
  Filled 2023-01-30: qty 1

## 2023-01-30 MED ORDER — PROPOFOL 10 MG/ML IV BOLUS
INTRAVENOUS | Status: DC | PRN
Start: 2023-01-30 — End: 2023-01-30
  Administered 2023-01-30: 120 mg via INTRAVENOUS

## 2023-01-30 MED ORDER — AMISULPRIDE (ANTIEMETIC) 5 MG/2ML IV SOLN: 10.0000 mg | Freq: Once | INTRAVENOUS | Status: DC | PRN

## 2023-01-30 MED ORDER — BUPIVACAINE LIPOSOME 1.3 % IJ SUSP
INTRAMUSCULAR | Status: DC | PRN
  Administered 2023-01-30: 10 mL via PERINEURAL

## 2023-01-30 MED ORDER — CYCLOBENZAPRINE HCL 10 MG PO TABS: 10.0000 mg | ORAL_TABLET | Freq: Three times a day (TID) | ORAL | 1 refills | Status: DC | PRN

## 2023-01-30 MED ORDER — FENTANYL CITRATE PF 50 MCG/ML IJ SOSY: 25.0000 ug | PREFILLED_SYRINGE | INTRAMUSCULAR | Status: DC | PRN

## 2023-01-30 MED ORDER — ONDANSETRON HCL 4 MG/2ML IJ SOLN: 4.0000 mg | Freq: Once | INTRAMUSCULAR | Status: DC | PRN

## 2023-01-30 MED ORDER — PHENYLEPHRINE 80 MCG/ML (10ML) SYRINGE FOR IV PUSH (FOR BLOOD PRESSURE SUPPORT)
PREFILLED_SYRINGE | INTRAVENOUS | Status: AC
  Filled 2023-01-30: qty 10

## 2023-01-30 MED ORDER — OXYCODONE HCL 5 MG PO TABS: 5.0000 mg | ORAL_TABLET | Freq: Once | ORAL | Status: DC | PRN

## 2023-01-30 MED ORDER — TRANEXAMIC ACID 1000 MG/10ML IV SOLN: 1000.0000 mg | INTRAVENOUS | Status: DC

## 2023-01-30 MED ORDER — TRANEXAMIC ACID-NACL 1000-0.7 MG/100ML-% IV SOLN
1000.0000 mg | INTRAVENOUS | Status: AC
  Administered 2023-01-30: 1000 mg via INTRAVENOUS
  Filled 2023-01-30: qty 100

## 2023-01-30 MED ORDER — ORAL CARE MOUTH RINSE: 15.0000 mL | Freq: Once | OROMUCOSAL | Status: DC

## 2023-01-30 MED ORDER — OXYCODONE HCL 5 MG/5ML PO SOLN: 5.0000 mg | Freq: Once | ORAL | Status: DC | PRN

## 2023-01-30 MED ORDER — VANCOMYCIN HCL 1000 MG IV SOLR
INTRAVENOUS | Status: DC | PRN
Start: 2023-01-30 — End: 2023-01-30
  Administered 2023-01-30: 1000 mg via TOPICAL

## 2023-01-30 MED ORDER — LIDOCAINE HCL (PF) 2 % IJ SOLN
INTRAMUSCULAR | Status: AC
  Filled 2023-01-30: qty 5

## 2023-01-30 MED ORDER — VANCOMYCIN HCL 1000 MG IV SOLR
INTRAVENOUS | Status: AC
  Filled 2023-01-30: qty 20

## 2023-01-30 MED ORDER — ONDANSETRON HCL 4 MG/2ML IJ SOLN
INTRAMUSCULAR | Status: AC
  Filled 2023-01-30: qty 2

## 2023-01-30 MED ORDER — CEFAZOLIN SODIUM-DEXTROSE 2-4 GM/100ML-% IV SOLN
2.0000 g | INTRAVENOUS | Status: AC
  Administered 2023-01-30: 2 g via INTRAVENOUS
  Filled 2023-01-30: qty 100

## 2023-01-30 MED ORDER — OXYCODONE-ACETAMINOPHEN 5-325 MG PO TABS: 1.0000 | ORAL_TABLET | ORAL | 0 refills | Status: DC | PRN

## 2023-01-30 MED ORDER — ROCURONIUM BROMIDE 100 MG/10ML IV SOLN
INTRAVENOUS | Status: DC | PRN
  Administered 2023-01-30: 70 mg via INTRAVENOUS

## 2023-01-30 MED ORDER — BUPIVACAINE HCL (PF) 0.5 % IJ SOLN
INTRAMUSCULAR | Status: DC | PRN
  Administered 2023-01-30: 15 mL via PERINEURAL

## 2023-01-30 MED ORDER — 0.9 % SODIUM CHLORIDE (POUR BTL) OPTIME
TOPICAL | Status: DC | PRN
  Administered 2023-01-30: 1000 mL

## 2023-01-30 MED ORDER — MIDAZOLAM HCL 2 MG/2ML IJ SOLN
1.0000 mg | Freq: Once | INTRAMUSCULAR | Status: DC
  Filled 2023-01-30: qty 2

## 2023-01-30 MED ORDER — PHENYLEPHRINE HCL (PRESSORS) 10 MG/ML IV SOLN
INTRAVENOUS | Status: AC
  Filled 2023-01-30: qty 1

## 2023-01-30 MED ORDER — DEXAMETHASONE SODIUM PHOSPHATE 4 MG/ML IJ SOLN
INTRAMUSCULAR | Status: DC | PRN
  Administered 2023-01-30: 10 mg via INTRAVENOUS

## 2023-01-30 MED ORDER — PROPOFOL 10 MG/ML IV BOLUS
INTRAVENOUS | Status: AC
  Filled 2023-01-30: qty 20

## 2023-01-30 MED ORDER — SUGAMMADEX SODIUM 200 MG/2ML IV SOLN
INTRAVENOUS | Status: DC | PRN
  Administered 2023-01-30: 200 mg via INTRAVENOUS

## 2023-01-30 MED ORDER — FENTANYL CITRATE PF 50 MCG/ML IJ SOSY
50.0000 ug | PREFILLED_SYRINGE | Freq: Once | INTRAMUSCULAR | Status: AC
  Administered 2023-01-30: 50 ug via INTRAVENOUS
  Filled 2023-01-30: qty 2

## 2023-01-30 MED ORDER — STERILE WATER FOR IRRIGATION IR SOLN
Status: DC | PRN
  Administered 2023-01-30: 2000 mL

## 2023-01-30 MED ORDER — LACTATED RINGERS IV SOLN: INTRAVENOUS | Status: DC | PRN

## 2023-01-30 MED ORDER — PHENYLEPHRINE HCL-NACL 20-0.9 MG/250ML-% IV SOLN
INTRAVENOUS | Status: DC | PRN
  Administered 2023-01-30: 50 ug/min via INTRAVENOUS

## 2023-01-30 MED ORDER — PHENYLEPHRINE HCL (PRESSORS) 10 MG/ML IV SOLN
INTRAVENOUS | Status: DC | PRN
Start: 2023-01-30 — End: 2023-01-30
  Administered 2023-01-30 (×3): 80 ug via INTRAVENOUS

## 2023-01-30 SURGICAL SUPPLY — 68 items
ADH SKN CLS APL DERMABOND .7 (GAUZE/BANDAGES/DRESSINGS) ×1
AID PSTN UNV HD RSTRNT DISP (MISCELLANEOUS) ×1
BAG COUNTER SPONGE SURGICOUNT (BAG) IMPLANT
BAG SPEC THK2 15X12 ZIP CLS (MISCELLANEOUS) ×1
BAG SPNG CNTER NS LX DISP (BAG)
BAG ZIPLOCK 12X15 (MISCELLANEOUS) ×2 IMPLANT
BIT DRILL AR 3 NS (BIT) IMPLANT
BLADE SAW SGTL 83.5X18.5 (BLADE) ×2 IMPLANT
BNDG CMPR 5X4 CHSV STRCH STRL (GAUZE/BANDAGES/DRESSINGS) ×1
BNDG COHESIVE 4X5 TAN STRL LF (GAUZE/BANDAGES/DRESSINGS) ×2 IMPLANT
BSPLAT GLND +2X24 MDLR (Joint) ×1 IMPLANT
COOLER ICEMAN CLASSIC (MISCELLANEOUS) ×2 IMPLANT
COVER BACK TABLE 60X90IN (DRAPES) ×2 IMPLANT
COVER SURGICAL LIGHT HANDLE (MISCELLANEOUS) ×2 IMPLANT
CUP SUT UNIV REVERS 36 NEUTRAL (Cup) IMPLANT
DERMABOND ADVANCED .7 DNX12 (GAUZE/BANDAGES/DRESSINGS) ×2 IMPLANT
DRAPE ORTHO SPLIT 77X108 STRL (DRAPES) ×2
DRAPE SHEET LG 3/4 BI-LAMINATE (DRAPES) ×2 IMPLANT
DRAPE SURG 17X11 SM STRL (DRAPES) ×2 IMPLANT
DRAPE SURG ORHT 6 SPLT 77X108 (DRAPES) ×4 IMPLANT
DRAPE TOP 10253 STERILE (DRAPES) ×2 IMPLANT
DRAPE U-SHAPE 47X51 STRL (DRAPES) ×2 IMPLANT
DRESSING AQUACEL AG SP 3.5X6 (GAUZE/BANDAGES/DRESSINGS) ×2 IMPLANT
DRSG AQUACEL AG ADV 3.5X 6 (GAUZE/BANDAGES/DRESSINGS) IMPLANT
DRSG AQUACEL AG ADV 3.5X10 (GAUZE/BANDAGES/DRESSINGS) IMPLANT
DRSG AQUACEL AG SP 3.5X6 (GAUZE/BANDAGES/DRESSINGS) ×1
DURAPREP 26ML APPLICATOR (WOUND CARE) ×2 IMPLANT
ELECT BLADE TIP CTD 4 INCH (ELECTRODE) ×2 IMPLANT
ELECT PENCIL ROCKER SW 15FT (MISCELLANEOUS) ×2 IMPLANT
ELECT REM PT RETURN 15FT ADLT (MISCELLANEOUS) ×2 IMPLANT
FACESHIELD WRAPAROUND (MASK) ×5 IMPLANT
FACESHIELD WRAPAROUND OR TEAM (MASK) ×10 IMPLANT
GLENOID UNI REV MOD 24 +2 LAT (Joint) IMPLANT
GLENOSPHERE 36 +4 LAT/24 (Joint) IMPLANT
GLOVE BIO SURGEON STRL SZ7.5 (GLOVE) ×2 IMPLANT
GLOVE BIO SURGEON STRL SZ8 (GLOVE) ×2 IMPLANT
GLOVE SS BIOGEL STRL SZ 7 (GLOVE) ×2 IMPLANT
GLOVE SS BIOGEL STRL SZ 7.5 (GLOVE) ×2 IMPLANT
GOWN STRL SURGICAL XL XLNG (GOWN DISPOSABLE) ×4 IMPLANT
KIT BASIN OR (CUSTOM PROCEDURE TRAY) ×2 IMPLANT
KIT TURNOVER KIT A (KITS) IMPLANT
LINER HUMERAL 36 +3MM SM (Shoulder) IMPLANT
MANIFOLD NEPTUNE II (INSTRUMENTS) ×2 IMPLANT
NDL TAPERED W/ NITINOL LOOP (MISCELLANEOUS) ×2 IMPLANT
NEEDLE TAPERED W/ NITINOL LOOP (MISCELLANEOUS) ×1 IMPLANT
NS IRRIG 1000ML POUR BTL (IV SOLUTION) ×2 IMPLANT
PACK SHOULDER (CUSTOM PROCEDURE TRAY) ×2 IMPLANT
PAD ARMBOARD 7.5X6 YLW CONV (MISCELLANEOUS) ×2 IMPLANT
PAD COLD SHLDR WRAP-ON (PAD) ×2 IMPLANT
PIN NITINOL TARGETER 2.8 (PIN) IMPLANT
PIN SET MODULAR GLENOID SYSTEM (PIN) IMPLANT
RESTRAINT HEAD UNIVERSAL NS (MISCELLANEOUS) ×2 IMPLANT
SCREW CENTRAL MODULAR 25 (Screw) IMPLANT
SCREW PERI LOCK 5.5X24 (Screw) IMPLANT
SCREW PERIPHERAL 5.5X20 LOCK (Screw) IMPLANT
SLING ARM FOAM STRAP LRG (SOFTGOODS) IMPLANT
SLING ARM FOAM STRAP MED (SOFTGOODS) IMPLANT
STEM HUMERAL UNIVERS SZ8 (Stem) IMPLANT
SUT MNCRL AB 3-0 PS2 18 (SUTURE) ×2 IMPLANT
SUT MON AB 2-0 CT1 36 (SUTURE) ×2 IMPLANT
SUT VIC AB 1 CT1 36 (SUTURE) ×2 IMPLANT
SUTURE TAPE 1.3 40 TPR END (SUTURE) ×4 IMPLANT
SUTURETAPE 1.3 40 TPR END (SUTURE) ×2
TOWEL OR 17X26 10 PK STRL BLUE (TOWEL DISPOSABLE) ×2 IMPLANT
TOWEL OR NON WOVEN STRL DISP B (DISPOSABLE) ×2 IMPLANT
TUBE SUCTION HIGH CAP CLEAR NV (SUCTIONS) ×2 IMPLANT
TUBING CONNECTING 10 (TUBING) ×2 IMPLANT
WATER STERILE IRR 1000ML POUR (IV SOLUTION) ×4 IMPLANT

## 2023-01-30 NOTE — Anesthesia Procedure Notes (Signed)
Procedure Name: Intubation Date/Time: 01/30/2023 9:56 AM  Performed by: Cleda Clarks, CRNAPre-anesthesia Checklist: Patient identified, Emergency Drugs available, Suction available and Patient being monitored Patient Re-evaluated:Patient Re-evaluated prior to induction Oxygen Delivery Method: Circle system utilized Preoxygenation: Pre-oxygenation with 100% oxygen Induction Type: IV induction Ventilation: Mask ventilation without difficulty Laryngoscope Size: Glidescope and 3 Tube type: Oral Number of attempts: 1 Airway Equipment and Method: Stylet, Oral airway and Video-laryngoscopy Placement Confirmation: ETT inserted through vocal cords under direct vision, positive ETCO2 and breath sounds checked- equal and bilateral Secured at: 21 cm Tube secured with: Tape Dental Injury: Teeth and Oropharynx as per pre-operative assessment

## 2023-01-30 NOTE — Op Note (Signed)
01/30/2023  11:05 AM  PATIENT:   Theresa Underwood  82 y.o. female  PRE-OPERATIVE DIAGNOSIS:  Left shoulder rotator cuff tear arthropathy  POST-OPERATIVE DIAGNOSIS: Same  PROCEDURE: Left shoulder reverse arthroplasty utilizing a press-fit size 8 Arthrex stem with a neutral metathesis, +3 polyethylene insert, 36/+4 glenosphere and a small/+2 baseplate  SURGEON:  , Vania Rea M.D.  ASSISTANTS: Ralene Bathe, PA-C  Ralene Bathe, PA-C was utilized as an Geophysicist/field seismologist throughout this case, essential for help with positioning the patient, positioning extremity, tissue manipulation, implantation of the prosthesis, suture management, wound closure, and intraoperative decision-making.  ANESTHESIA:   General Endotracheal and interscalene block with Exparel  EBL: 100 cc  SPECIMEN: None  Drains: None   PATIENT DISPOSITION:  PACU - hemodynamically stable.    PLAN OF CARE: Discharge to home after PACU  Brief history:  Patient is an 82 year old female with chronic and progressively increasing left shoulder pain related to severe rotator cuff tear arthropathy.  Due to her increasing functional mutations and failure to respond to prolonged attempts at conservative management, she is brought to the operating this time for planned left shoulder reverse arthroplasty.  Preoperatively, I counseled the patient regarding treatment options and risks versus benefits thereof.  Possible surgical complications were all reviewed including potential for bleeding, infection, neurovascular injury, persistent pain, loss of motion, anesthetic complication, failure of the implant, and possible need for additional surgery. They understand and accept and agrees with our planned procedure.   Procedure detail:  After undergoing routine preop evaluation the patient received prophylactic antibiotics and interscalene block with Exparel established in the holding area by the anesthesia department.  Subsequently placed  spine on the operating table and underwent the smooth induction of a general endotracheal anesthesia.  Placed into the beachchair position and appropriately padded and protected.  The left shoulder girdle region was sterilely prepped and draped in standard fashion.  Timeout was called.  A deltopectoral approach was made to the left shoulder through an approximately 6 cm incision.  Skin flaps elevated dissection carried deeply and the deltopectoral interval was then developed from proximal to distal with the vein taken laterally.  The conjoined tendon was mobilized and retracted medially and adhesions were divided beneath the deltoid.  The long head biceps tendon was then tenodesed at the upper border the pectoralis major tendon with proximal segment unroofed and excised.  The rotator cuff was split to the base of the coracoid and the subscap was then separated from the lesser tuberosity using electrocautery and tagged with a pair of grasping suture tape sutures.  Capsular attachments were then divided from the anterior and inferior margins of the humeral neck and humeral head was then delivered through the wound.  An extra medullary guide was then used to outline the proposed humeral head resection which we performed with an oscillating saw at approximate 20 degrees of retroversion.  Marginal osteophytes were then removed with a rondure and a metal cap was then placed over the cut proximal humeral surface.  We then exposed the glenoid and performed a circumferential labral resection.  There was significant posterior erosion which we corrected by the placement of our guidepin and the glenoid was then prepared with the central followed by the peripheral reamer to a stable subchondral bony bed.  Preparation completed with a drill and tapped for a 25 mm lag screw.  All debris was then carefully irrigated free.  The final implant was assembled and baseplate was inserted with vancomycin powder applied on  the threads of  the lag screw and excellent purchase and fixation was achieved.  All of the peripheral locking screws were then placed using standard technique with excellent fixation.  A 36/+4 glenosphere was then impacted onto the baseplate and a central locking screw was placed.  We returned attention back to the humeral metaphysis where the canal was opened and we broached up to a size 8 stem.  A neutral metaphyseal reaming guide was then used.  The metaphysis.  Trial implant placed and trial reduction showed good motion stability and soft tissue balance.  The trial was then removed.  A final implant was assembled.  The canal was irrigated cleaned and dried with vancomycin powder sprayed into the canal and the final implant was then seated with excellent fixation.  Trial reduction showed the best balance mobility and stability with a +3 poly.  The trial was then removed.  A final +3 poly was then impacted after the implant was cleaned and dried.  Our final reduction showed excellent motion stability and soft tissue balance.  The subscapularis was mobilized and showed good elasticity and it was repaired back to the eyelets on the collar of the implant using the previously placed suture tape sutures.  The wound was copiously irrigated.  Final hemostasis was obtained.  The balance of vancomycin powder was then sprayed liberally throughout the deep soft tissue planes.  The deltopectoral interval was reapproximated with a series of figure-of-eight number Vicryl sutures.  2-0 Monocryl used to close the subcu layer and intracuticular 3-0 Monocryl used to close the skin followed by Dermabond and an Aquacel dressing.  The left arm was then placed into a sling.  The patient was awakened, extubated, and taken to the recovery room in stable condition.  Senaida Lange MD   Contact # [phone]

## 2023-01-30 NOTE — Evaluation (Signed)
Occupational Therapy Evaluation Patient Details Name: Theresa Underwood MRN: [id number] DOB: September 05, 1940 Today's Date: 01/30/2023   History of Present Illness Theresa Underwood is a 82 yr old female who is s/p a L shoulder reverse arthroplasty on 01-30-23, due to L rotator cuff tear arthropathy.   Clinical Impression   Pt is s/p shoulder replacement 01-30-23 without functional use of left non-dominant upper extremity. Therapist provided education and instruction to patient and daughter with regards to ROM/exercises, post-op precautions, UE positioning, donning upper extremity clothing, how to use ice machine, recommendations for bathing while maintaining shoulder precautions, use of ice for pain and edema management, and correctly donning/doffing sling. Patient needed assistance to donn shirt, underwear, pants, and shoes, with instruction provided on compensatory strategies to perform ADLs.Patient's daughter presented with good understanding, recall, and teach back abilities; the pt presented with decreased/compromised recall and teach back abilities. Given pt's decreased recall and occasional difficulty with teach back, OT will continue to follow her for further services in the acute care setting, to reinforce education/instruction provided. OT also recommends the pt have increased supervision and assistance as needed, once she discharges home; pt's daughter stated she is not working and will be available to assist the pt.  Patient to follow up with MD for further therapy needs.         If plan is discharge home, recommend the following: Assist for transportation;Direct supervision/assist for medications management;Assistance with cooking/housework;Help with stairs or ramp for entrance;A little help with bathing/dressing/bathroom    Functional Status Assessment  Patient has had a recent decline in their functional status and demonstrates the ability to make significant improvements in function in a  reasonable and predictable amount of time.  Equipment Recommendations  Tub/shower bench    Recommendations for Other Services       Precautions / Restrictions Precautions Precautions: Shoulder Shoulder Interventions: Shoulder sling/immobilizer Precaution Comments: If sitting in controlled environment, ok to come out of sling to give neck a break. Please sleep in it to protect until follow up in office.     OK to use operative arm for feeding, hygiene and ADLs.   Ok to instruct Pendulums and lap slides as exercises. Ok to use operative arm within the following parameters for ADL purposes     New ROM (2/13)   Ok for PROM, AAROM, AROM within pain tolerance and within the following ROM   ER 20   ABD 45   FE 60 Required Braces or Orthoses: Sling Restrictions Weight Bearing Restrictions: Yes LUE Weight Bearing: Non weight bearing Other Position/Activity Restrictions: Sling at all times except ADLs/exercises, no exercises for internal rotation, okay to perform elbow, wrist, and hand ROM      Mobility Bed Mobility      General bed mobility comments: pt was received seated in bedside chair    Transfers Overall transfer level: Needs assistance Equipment used: None Transfers: Sit to/from Stand Sit to Stand: Supervision           Balance Overall balance assessment: Mild deficits observed, not formally tested       ADL either performed or assessed with clinical judgement                   Pertinent Vitals/Pain Pain Assessment Pain Assessment: No/denies pain        Communication Communication Communication: No apparent difficulties   Cognition Arousal: Alert   Overall Cognitive Status: Difficult to assess          General Comments:  required occasional redirection to tasks, as well as cues for short term memory/recall           Shoulder Instructions Shoulder Instructions Donning/doffing shirt without moving shoulder: Caregiver independent with task Method for  sponge bathing under operated UE: Caregiver independent with task Donning/doffing sling/immobilizer: Caregiver independent with task Correct positioning of sling/immobilizer: Caregiver independent with task Pendulum exercises (written home exercise program): Caregiver independent with task ROM for elbow, wrist and digits of operated UE: Caregiver independent with task Sling wearing schedule (on at all times/off for ADL's): Caregiver independent with task Proper positioning of operated UE when showering: Caregiver independent with task Dressing change:  (N/A) Positioning of UE while sleeping: Caregiver independent with task    Home Living Family/patient expects to be discharged to:: Private residence Living Arrangements: Spouse/significant other Available Help at Discharge: Family Type of Home: House Home Access: Stairs to enter Secretary/administrator of Steps: 2 Entrance Stairs-Rails: None Home Layout: One level     Bathroom Shower/Tub: Dietitian: Rollator (4 wheels);Cane - single point;BSC/3in1          Prior Functioning/Environment Prior Level of Function : Independent/Modified Independent               ADLs Comments: She was independent with ADLs, driving, cooking, and cleaning.         OT Problem List: Impaired UE functional use      OT Treatment/Interventions:   Self-care/ADL training, therapeutic exercise, DME and/or AE instruction, therapeutic activities, Patient/family education   OT Goals(Current goals can be found in the care plan section) Acute Rehab OT Goals OT Goal Formulation: With patient Time For Goal Achievement: 02/13/23 Potential to Achieve Goals: Good ADL Goals Pt Will Perform Upper Body Dressing: with set-up;sitting Pt Will Perform Lower Body Dressing: with supervision;sitting/lateral leans Pt/caregiver will Perform Home Exercise Program: Left upper extremity;Independently;With written HEP provided Additional  ADL Goal #1: Pt will independently recall/verbalize LUE NWB status, sling wear schedule, and how to use ice machine, in order to ensure proper UE healing.   OT Frequency:                                            Min 1x/week                                                              AM-PAC OT "6 Clicks" Daily Activity     Outcome Measure Help from another person eating meals?: None Help from another person taking care of personal grooming?: None Help from another person toileting, which includes using toliet, bedpan, or urinal?: A Little Help from another person bathing (including washing, rinsing, drying)?: A Lot Help from another person to put on and taking off regular upper body clothing?: A Little Help from another person to put on and taking off regular lower body clothing?: A Little 6 Click Score: 19   End of Session Equipment Utilized During Treatment: Other (comment) (N/A) Nurse Communication: Other (comment) (shoulder education completed)  Activity Tolerance: Patient tolerated treatment well Patient left: Other (comment) (in bathroom with nurse informed and daughter present)  OT Visit  Diagnosis: Muscle weakness (generalized) (M62.81)                Time: 2956-2130 OT Time Calculation (min): 56 min Charges:  OT General Charges $OT Visit: 1 Visit OT Evaluation $OT Eval Moderate Complexity: 1 Mod OT Treatments $Self Care/Home Management : 8-22 mins    Reuben Likes, OTR/L 01/30/2023, 3:08 PM

## 2023-01-30 NOTE — Discharge Instructions (Signed)

## 2023-01-30 NOTE — Anesthesia Procedure Notes (Signed)
Anesthesia Regional Block: Interscalene brachial plexus block   Pre-Anesthetic Checklist: , timeout performed,  Correct Patient, Correct Site, Correct Laterality,  Correct Procedure, Correct Position, site marked,  Risks and benefits discussed,  Surgical consent,  Pre-op evaluation,  At surgeon's request and post-op pain management  Laterality: Left  Prep: chloraprep       Needles:  Injection technique: Single-shot  Needle Type: Echogenic Stimulator Needle     Needle Length: 10cm  Needle Gauge: 21   Needle insertion depth: 6 cm   Additional Needles:   Procedures:,,,, ultrasound used (permanent image in chart),,   Motor weakness within 5 minutes.  Narrative:  Start time: 01/30/2023 9:05 AM End time: 01/30/2023 9:10 AM Injection made incrementally with aspirations every 5 mL.  Performed by: Personally  Anesthesiologist: Mal Amabile, MD  Additional Notes: Timeout performed. Patient sedated. Relevant anatomy ID'd using [place]. Incremental 2-61ml injection of LA with frequent aspiration. Patient tolerated procedure well.

## 2023-01-30 NOTE — Anesthesia Postprocedure Evaluation (Signed)
Anesthesia Post Note  Patient: Theresa Underwood  Procedure(s) Performed: REVERSE SHOULDER ARTHROPLASTY (Left: Shoulder)     Patient location during evaluation: PACU Anesthesia Type: General Level of consciousness: awake and alert and oriented Pain management: pain level controlled Vital Signs Assessment: post-procedure vital signs reviewed and stable Respiratory status: spontaneous breathing, nonlabored ventilation and respiratory function stable Cardiovascular status: blood pressure returned to baseline and stable Postop Assessment: no apparent nausea or vomiting Anesthetic complications: no   No notable events documented.  Last Vitals:  Vitals:   01/30/23 1145 01/30/23 1200  BP: (!) 171/81 (!) 149/87  Pulse: 64 60  Resp: 16 12  Temp:    SpO2: 96% 96%    Last Pain:  Vitals:   01/30/23 1200  TempSrc:   PainSc: 0-No pain                 , A.

## 2023-01-30 NOTE — Transfer of Care (Signed)
Immediate Anesthesia Transfer of Care Note  Patient: Theresa Underwood  Procedure(s) Performed: REVERSE SHOULDER ARTHROPLASTY (Left: Shoulder)  Patient Location: PACU  Anesthesia Type:GA combined with regional for post-op pain  Level of Consciousness: awake, alert , and oriented  Airway & Oxygen Therapy: Patient Spontanous Breathing and Patient connected to face mask oxygen  Post-op Assessment: Report given to RN and Post -op Vital signs reviewed and stable  Post vital signs: Reviewed and stable  Last Vitals:  Vitals Value Taken Time  BP 171/77 01/30/23 1128  Temp    Pulse 60 01/30/23 1130  Resp 12 01/30/23 1130  SpO2 100 % 01/30/23 1130  Vitals shown include unfiled device data.  Last Pain:  Vitals:   01/30/23 0915  TempSrc:   PainSc: 0-No pain         Complications: No notable events documented.

## 2023-01-30 NOTE — H&P (Signed)
Theresa Underwood    Chief Complaint: Left shoulder rotator cuff tear arthropathy HPI: The patient is a 82 y.o. female with chronic and progressive increasing left shoulder pain related to severe rotator cuff tear arthropathy.  Due to her increasing function limitations and failure to respond to prolonged attempts at conservative management, she is brought to the operating this time for planned left shoulder reverse arthroplasty.  Patient had been scheduled for surgery earlier this year.  There have been concerns about her cardiovascular status and she is subsequently undergone cardiovascular workup and has been cleared for surgery today.  Past Medical History:  Diagnosis Date   Anemia    Arthritis    Bipolar disorder (HCC)    Burning pain    where bladder pushes thru vaginal wall   Cholelithiasis    Colon polyps    Complication of anesthesia    slight trouble turning head side to side   Contusion of right great toe with damage to nail    right toenail black for months   Cystocele    DDD (degenerative disc disease)    Depression    Diverticulosis    Ganglion cyst of right foot    due to bone spurs   GERD (gastroesophageal reflux disease)    Glucose intolerance (impaired glucose tolerance)    Hemorrhoids    History of bladder infections    History of TMJ disorder    no problems in years, wears bite guard when tmj occurs   HOH (hard of hearing)    left more than right, mild   Hyperlipidemia    Hypertension    Hypothyroidism    Peripheral neuropathy    bilateral feet, Dr. Allena Katz   Prolapsed bladder    Spinal stenosis    Tinnitus    left ear drum beat      Past Surgical History:  Procedure Laterality Date   ABDOMINAL HYSTERECTOMY  2002   total   BILIARY DILATION  08/03/2020   Procedure: BILIARY DILATION;  Surgeon: Meryl Dare, MD;  Location: [facility] ENDOSCOPY;  Service: Endoscopy;;   CHOLECYSTECTOMY N/A 08/01/2020   Procedure: LAPAROSCOPIC CHOLECYSTECTOMY WITH  INTRAOPERATIVE CHOLANGIOGRAM;  Surgeon: Axel Filler, MD;  Location: [facility] OR;  Service: General;  Laterality: N/A;   CYSTOCELE REPAIR N/A 12/05/2015   Procedure: CYSTOSCOPY, ANTERIOR REPAIR (CYSTOCELE), VAULT PROLAPSE AND GRAFT;  Surgeon: Alfredo Martinez, MD;  Location: [facility] ORS;  Service: Urology;  Laterality: N/A;   ENDOSCOPIC RETROGRADE CHOLANGIOPANCREATOGRAPHY (ERCP) WITH PROPOFOL N/A 08/03/2020   Procedure: ENDOSCOPIC RETROGRADE CHOLANGIOPANCREATOGRAPHY (ERCP) WITH PROPOFOL;  Surgeon: Meryl Dare, MD;  Location: [facility] ENDOSCOPY;  Service: Endoscopy;  Laterality: N/A;   REMOVAL OF STONES  08/03/2020   Procedure: REMOVAL OF STONES;  Surgeon: Meryl Dare, MD;  Location: [facility] ENDOSCOPY;  Service: Endoscopy;;   SPHINCTEROTOMY  08/03/2020   Procedure: Dennison Mascot;  Surgeon: Meryl Dare, MD;  Location: [facility] ENDOSCOPY;  Service: Endoscopy;;   TONSILLECTOMY AND ADENOIDECTOMY     as a child   TOTAL KNEE ARTHROPLASTY     bi-lateral 2000,2007   TUBAL LIGATION  1976   VAGINAL PROLAPSE REPAIR N/A 12/05/2015   Procedure: VAGINAL VAULT SUSPENSION;  Surgeon: Alfredo Martinez, MD;  Location: [facility] ORS;  Service: Urology;  Laterality: N/A;    Family History  Problem Relation Age of Onset   Bipolar disorder Mother    Osteoporosis Mother    Colon polyps Mother    Stroke Mother        Deceased, 65  Neuropathy Mother    Peripheral vascular disease Maternal Grandmother    Pancreatic cancer Maternal Grandmother    Colon polyps Father    Lung disease Father        Deceased, 80   Diverticulitis Sister    Neuropathy Maternal Grandfather    Colon cancer Neg Hx    Breast cancer Neg Hx     Social History:  reports that she has never smoked. She has never used smokeless tobacco. She reports current alcohol use. She reports that she does not use drugs.  BMI: Estimated body mass index is 23.13 kg/m as calculated from the following:   Height as of 01/22/23: 5\' 5"  (1.651 m).   Weight as of 11/14/22: 63  kg.  Lab Results  Component Value Date   ALBUMIN 2.6 (L) 08/04/2020   Diabetes: Patient does not have a diagnosis of diabetes.     Smoking Status:   reports that she has never smoked. She has never used smokeless tobacco.     No medications prior to admission.     Physical Exam: Left shoulder demonstrates painful and guarded motion as noted after recent office visits.  She has globally decreased strength to manual muscle testing.  Examination otherwise as noted at her recent office visits.  Imaging studies confirmed changes consistent with chronic left shoulder rotator cuff tear arthropathy.  Vitals     Assessment/Plan  Impression: Left shoulder rotator cuff tear arthropathy  Plan of Action: Procedure(s): REVERSE SHOULDER ARTHROPLASTY   M  01/30/2023, 5:58 AM Contact # [phone]

## 2023-01-30 NOTE — Care Plan (Signed)
Ortho Bundle Case Management Note  Patient Details  Name: Theresa Underwood MRN: [id number] Date of Birth: 07/24/1940  L Rev TSA on 01-30-23 DCP:  Home with dtr and husband DME:  No needs PT:  [facility]                    DME Arranged:  N/A DME Agency:  NA  HH Arranged:  NA HH Agency:  NA  Additional Comments: Please contact me with any questions of if this plan should need to change.  Ennis Forts, RN,CCM [facility]  [phone] 01/30/2023, 9:22 AM

## 2023-01-31 ENCOUNTER — Encounter (HOSPITAL_COMMUNITY): Payer: Self-pay | Admitting: Orthopedic Surgery

## 2023-02-12 DIAGNOSIS — Z96612 Presence of left artificial shoulder joint: Secondary | ICD-10-CM | POA: Diagnosis not present

## 2023-02-12 DIAGNOSIS — Z471 Aftercare following joint replacement surgery: Secondary | ICD-10-CM | POA: Diagnosis not present

## 2023-03-03 DIAGNOSIS — M25612 Stiffness of left shoulder, not elsewhere classified: Secondary | ICD-10-CM | POA: Diagnosis not present

## 2023-03-03 DIAGNOSIS — M25512 Pain in left shoulder: Secondary | ICD-10-CM | POA: Diagnosis not present

## 2023-03-06 DIAGNOSIS — M25612 Stiffness of left shoulder, not elsewhere classified: Secondary | ICD-10-CM | POA: Diagnosis not present

## 2023-03-06 DIAGNOSIS — M25512 Pain in left shoulder: Secondary | ICD-10-CM | POA: Diagnosis not present

## 2023-03-13 DIAGNOSIS — M25512 Pain in left shoulder: Secondary | ICD-10-CM | POA: Diagnosis not present

## 2023-03-13 DIAGNOSIS — M25612 Stiffness of left shoulder, not elsewhere classified: Secondary | ICD-10-CM | POA: Diagnosis not present

## 2023-03-19 DIAGNOSIS — M25612 Stiffness of left shoulder, not elsewhere classified: Secondary | ICD-10-CM | POA: Diagnosis not present

## 2023-03-19 DIAGNOSIS — M25512 Pain in left shoulder: Secondary | ICD-10-CM | POA: Diagnosis not present

## 2023-03-21 DIAGNOSIS — M25612 Stiffness of left shoulder, not elsewhere classified: Secondary | ICD-10-CM | POA: Diagnosis not present

## 2023-03-21 DIAGNOSIS — M25512 Pain in left shoulder: Secondary | ICD-10-CM | POA: Diagnosis not present

## 2023-03-25 DIAGNOSIS — M25612 Stiffness of left shoulder, not elsewhere classified: Secondary | ICD-10-CM | POA: Diagnosis not present

## 2023-03-25 DIAGNOSIS — M25512 Pain in left shoulder: Secondary | ICD-10-CM | POA: Diagnosis not present

## 2023-03-27 DIAGNOSIS — M25512 Pain in left shoulder: Secondary | ICD-10-CM | POA: Diagnosis not present

## 2023-03-27 DIAGNOSIS — M25612 Stiffness of left shoulder, not elsewhere classified: Secondary | ICD-10-CM | POA: Diagnosis not present

## 2023-04-01 DIAGNOSIS — M25512 Pain in left shoulder: Secondary | ICD-10-CM | POA: Diagnosis not present

## 2023-04-01 DIAGNOSIS — M25612 Stiffness of left shoulder, not elsewhere classified: Secondary | ICD-10-CM | POA: Diagnosis not present

## 2023-04-03 DIAGNOSIS — M25512 Pain in left shoulder: Secondary | ICD-10-CM | POA: Diagnosis not present

## 2023-04-03 DIAGNOSIS — M25612 Stiffness of left shoulder, not elsewhere classified: Secondary | ICD-10-CM | POA: Diagnosis not present

## 2023-04-08 DIAGNOSIS — M25612 Stiffness of left shoulder, not elsewhere classified: Secondary | ICD-10-CM | POA: Diagnosis not present

## 2023-04-08 DIAGNOSIS — M25512 Pain in left shoulder: Secondary | ICD-10-CM | POA: Diagnosis not present

## 2023-04-10 DIAGNOSIS — M25512 Pain in left shoulder: Secondary | ICD-10-CM | POA: Diagnosis not present

## 2023-04-10 DIAGNOSIS — M25612 Stiffness of left shoulder, not elsewhere classified: Secondary | ICD-10-CM | POA: Diagnosis not present

## 2023-04-15 DIAGNOSIS — M25512 Pain in left shoulder: Secondary | ICD-10-CM | POA: Diagnosis not present

## 2023-04-15 DIAGNOSIS — M25612 Stiffness of left shoulder, not elsewhere classified: Secondary | ICD-10-CM | POA: Diagnosis not present

## 2023-04-16 DIAGNOSIS — F3132 Bipolar disorder, current episode depressed, moderate: Secondary | ICD-10-CM | POA: Diagnosis not present

## 2023-04-17 DIAGNOSIS — M25612 Stiffness of left shoulder, not elsewhere classified: Secondary | ICD-10-CM | POA: Diagnosis not present

## 2023-04-17 DIAGNOSIS — M25512 Pain in left shoulder: Secondary | ICD-10-CM | POA: Diagnosis not present

## 2023-04-21 DIAGNOSIS — E039 Hypothyroidism, unspecified: Secondary | ICD-10-CM | POA: Diagnosis not present

## 2023-04-21 DIAGNOSIS — F319 Bipolar disorder, unspecified: Secondary | ICD-10-CM | POA: Diagnosis not present

## 2023-04-21 DIAGNOSIS — N183 Chronic kidney disease, stage 3 unspecified: Secondary | ICD-10-CM | POA: Diagnosis not present

## 2023-04-21 DIAGNOSIS — E538 Deficiency of other specified B group vitamins: Secondary | ICD-10-CM | POA: Diagnosis not present

## 2023-04-21 DIAGNOSIS — G629 Polyneuropathy, unspecified: Secondary | ICD-10-CM | POA: Diagnosis not present

## 2023-04-21 DIAGNOSIS — N1831 Chronic kidney disease, stage 3a: Secondary | ICD-10-CM | POA: Diagnosis not present

## 2023-04-21 DIAGNOSIS — M1990 Unspecified osteoarthritis, unspecified site: Secondary | ICD-10-CM | POA: Diagnosis not present

## 2023-04-21 DIAGNOSIS — E785 Hyperlipidemia, unspecified: Secondary | ICD-10-CM | POA: Diagnosis not present

## 2023-04-21 DIAGNOSIS — I700 Atherosclerosis of aorta: Secondary | ICD-10-CM | POA: Diagnosis not present

## 2023-04-21 DIAGNOSIS — Z23 Encounter for immunization: Secondary | ICD-10-CM | POA: Diagnosis not present

## 2023-04-21 DIAGNOSIS — F1320 Sedative, hypnotic or anxiolytic dependence, uncomplicated: Secondary | ICD-10-CM | POA: Diagnosis not present

## 2023-04-21 DIAGNOSIS — E663 Overweight: Secondary | ICD-10-CM | POA: Diagnosis not present

## 2023-04-21 DIAGNOSIS — D649 Anemia, unspecified: Secondary | ICD-10-CM | POA: Diagnosis not present

## 2023-04-21 DIAGNOSIS — I129 Hypertensive chronic kidney disease with stage 1 through stage 4 chronic kidney disease, or unspecified chronic kidney disease: Secondary | ICD-10-CM | POA: Diagnosis not present

## 2023-04-23 DIAGNOSIS — M25612 Stiffness of left shoulder, not elsewhere classified: Secondary | ICD-10-CM | POA: Diagnosis not present

## 2023-04-23 DIAGNOSIS — M25512 Pain in left shoulder: Secondary | ICD-10-CM | POA: Diagnosis not present

## 2023-04-28 DIAGNOSIS — Z471 Aftercare following joint replacement surgery: Secondary | ICD-10-CM | POA: Diagnosis not present

## 2023-04-28 DIAGNOSIS — Z96612 Presence of left artificial shoulder joint: Secondary | ICD-10-CM | POA: Diagnosis not present

## 2023-05-19 ENCOUNTER — Encounter: Payer: Self-pay | Admitting: Neurology

## 2023-05-19 ENCOUNTER — Ambulatory Visit: Payer: PPO | Admitting: Neurology

## 2023-05-19 VITALS — BP 123/74 | HR 81 | Ht 65.0 in | Wt 141.0 lb

## 2023-05-19 DIAGNOSIS — G629 Polyneuropathy, unspecified: Secondary | ICD-10-CM

## 2023-05-19 DIAGNOSIS — G3184 Mild cognitive impairment, so stated: Secondary | ICD-10-CM | POA: Diagnosis not present

## 2023-05-19 MED ORDER — GABAPENTIN 600 MG PO TABS: ORAL_TABLET | ORAL | 3 refills | Status: DC

## 2023-05-19 NOTE — Progress Notes (Signed)
Follow-up Visit   Date: 05/19/23    Theresa Underwood MRN: [id number] DOB: 15-Dec-1940   Interim History: Theresa Underwood is a 82 y.o. right-handed Caucasian female with history of hyperlipidemia, hypertension, hypothyroidism, bipolar affective disorder s/p ECT, borderline diabetes type 2, lumbar spinal stenosis, here for follow-up of hereditary peripheral neuropathy.  The patient was accompanied to the clinic by self.  History of present illness: In the early 1990s, she developed gradual onset of numbness involving the tips of her toes, described as a tight sensation over the feet. Over the years, her symptoms have progressed and now she gets numbness and prickly sensation over her lower legs (below the knee) and into her feet. Symptoms are worse when she rests and alleviated with neurontin. Of late, she has developed cold sensation of her feet and often puts them in a warm bath which helps. She takes neurontin 600mg  TID (8am, 1pm, 12am) and and lamictal 150mg  (for depression). She denies any fall and is ambulating independently. She has intermittent pain and tinging of the hands, but denies any weakness. Previously tried medications include metanx, Cymbalta, and Lyrica. She also has hammertoes and history of similar symptoms involving her mother, sister, father, and maternal grandfather. None of her family members were wheelchair-bound. Her mother has numbness/tingling of the feet and ambulated independently until late 80s then transitioned to a cane/walker due to spinal stenosis.   She was initially under the care of Dr. Karis Juba at [facility] who diagnosed her with hereditary peripheral neuropathy in 1992 and was until his care until 1995 at which time he left the practice and transitioned care to Dr. Sandria Manly and since his retirement was last seen by Dr. Terrace Arabia in May 2014. Her last clinic note dated 11/05/2012 was reviewed and summarized as follows:  Initial EMG in July 1996 and January 1998 showed sensory  and motor polyneuropathy with predominantly demyelinating features. Subsequent electrodiagnostic studies in August 2001 and December 2007 was more consistent with "an axonal 'small fiber' peripheral neuropathy". There was no evidence of a lumbosacral radiculopathy.   EMG of the arms shows cervical radiculopathy affecting right C5, left C7, and bilateral T1 nerve roots.  There is no evidence of neuropathy or CTS.  Her neuropathic pain is well-controlled neurontin 600mg  TID.    In 2016, she started having memory issues, especially with short-term recall, such as trying to remember a telephone number.  She is very active and does her own IADLs. No wording finding problems, but has been told that she repeats herself.  She has a history of bipolar for which she takes lamictal.  Denies any depressive symptoms.   UPDATE 05/19/2023:  She is here for follow-up visit. She continues to have painful neuropathy involving the lower legs and feet.  She takes gabapentin 600mg  three times daily which helps, but she continues to have some days where the pain is severe.  She walks unassisted, balance is fair.  No falls.  She travelled to [place] and [place] last year and enjoyed the trip, although travel there was tiring.  She has some word-finding difficulty, but is able to keep up with finances, medications, and driving.  She is seeing an eye doctor for cataracts and had evaluation for right blepharoplasty.  Medications:  Current Outpatient Medications on File Prior to Visit  Medication Sig Dispense Refill   acetaminophen (TYLENOL) 500 MG tablet Take 2 tablets (1,000 mg total) by mouth every 6 (six) hours. (Patient taking differently: Take 1,000 mg by mouth  every 6 (six) hours as needed for moderate pain (pain score 4-6).) 30 tablet 0   BERBERINE CHLORIDE PO Take 1 capsule by mouth in the morning and at bedtime. On Hold     Cholecalciferol (D3 VITAMIN PO) Take 2,000 Units by mouth daily.     clonazePAM (KLONOPIN) 0.5 MG  tablet Take 0.5 mg by mouth 2 (two) times daily as needed for anxiety.     cyclobenzaprine (FLEXERIL) 10 MG tablet Take 1 tablet (10 mg total) by mouth 3 (three) times daily as needed for muscle spasms. 30 tablet 1   gabapentin (NEURONTIN) 600 MG tablet TAKE 1 TABLET(600 MG) BY MOUTH THREE TIMES . OK to take extra dose as needed. 290 tablet 3   ibuprofen (ADVIL) 200 MG tablet Take 200-400 mg by mouth every 6 (six) hours as needed for moderate pain.     lamoTRIgine (LAMICTAL) 200 MG tablet Take 200 mg by mouth daily.     levothyroxine (SYNTHROID, LEVOTHROID) 112 MCG tablet Take 112 mcg by mouth at bedtime.     lisinopril (PRINIVIL,ZESTRIL) 10 MG tablet Take 10 mg by mouth every Monday, Wednesday, and Friday.     Multiple Vitamins-Minerals (WOMENS MULTIVITAMIN PO) Take 1 tablet by mouth daily.     mupirocin cream (BACTROBAN) 2 % Apply 1 Application topically daily as needed (irritation).     No current facility-administered medications on file prior to visit.    Allergies:  Allergies  Allergen Reactions   Sulfa Antibiotics Hives   Carbamazepine Hives    REACTION: hives with tegretol   Ciprofloxacin     Pt has nerve damage in both of her feet   Hibiclens [Chlorhexidine Gluconate] Other (See Comments)    Hibiclens caused vaginal and perineal irritation per pt    Sulfonamide Derivatives Hives     Vital Signs:  BP 123/74   Pulse 81   Ht 5\' 5"  (1.651 m)   Wt 141 lb (64 kg)   SpO2 97%   BMI 23.46 kg/m  Neurological Exam:    MENTAL STATUS including orientation to time, place, person, recent and remote memory, attention span and concentration, language, and fund of knowledge is normal.  Speech is not dysarthric.   CRANIAL NERVES:  Normal conjugate, extra-ocular eye movements in all directions of gaze.  Mild right ptosis, no worsening with sustained upgaze.   MOTOR:  Motor strength is 5/5 in all extremities, except interosseus muscles 5-/5, ABP 4+/5, and toe extensors 5-/5 bilaterally.   Tone is normal.     REFLEXES:  Reflexes are 3+/4 throughout, except absent Achilles bilaterally.  SENSORY:  Vibration absent at the toes bilaterally, trace at the ankles, normal at the knees and MCP.    COORDINATION/GAIT:  Gait narrow based and stable, unassisted.  Data: EMG 10/25/2013:  There is electrophysiological evidence of a multilevel cervical radiculopathy affecting the right C5, left C7, and bilateral T1 nerve roots/segment, overall mild-to-moderate degree electrically. There is no evidence generalized sensorimotor polyneuropathy or carpal tunnel syndrome affecting the upper extremities.  Labs 10/05/2013:  Vitamin B12 291*, SPEP/UPEP with IFE - no M protein, copper 139  MRI of the lumbar spine without contrast 12/07/2004: Multilevel spondylosis, advanced at L3-4 L5-S1, and moderate to mild spondylosis L3-4 and mild spinal stenosis at L4-5. An osteophyte at L5-S1 might encroach the extraforaminal portion of the left L5 nerve root. There is no disc herniation.      IMPRESSION/PLAN: 1.  Peripheral neuropathy affecting bilateral lower extremities, with progression.  Strong family history  of neuropathy, likely hereditary (CMT1A?)  - Continue gabapentin 600mg  three times daily. OK to take an extra tablet daily as needed  - Fall precautions discussed  2.  Mild cognitive impairment, stable and most likely contributed by anxiety.  No signs of dementia.  She remains independent with IADLs and ADLs.   Return to clinic in 1 year  Total time spent reviewing records, interview, history/exam, documentation, and coordination of care on day of encounter:  30 min   Thank you for allowing me to participate in patient's care.  If I can answer any additional questions, I would be pleased to do so.    Sincerely,    Ziquan Fidel K. Allena Katz, DO

## 2023-06-04 DIAGNOSIS — M7661 Achilles tendinitis, right leg: Secondary | ICD-10-CM | POA: Diagnosis not present

## 2023-06-04 DIAGNOSIS — M19072 Primary osteoarthritis, left ankle and foot: Secondary | ICD-10-CM | POA: Diagnosis not present

## 2023-06-13 DIAGNOSIS — R5383 Other fatigue: Secondary | ICD-10-CM | POA: Diagnosis not present

## 2023-06-13 DIAGNOSIS — R051 Acute cough: Secondary | ICD-10-CM | POA: Diagnosis not present

## 2023-06-13 DIAGNOSIS — R197 Diarrhea, unspecified: Secondary | ICD-10-CM | POA: Diagnosis not present

## 2023-06-13 DIAGNOSIS — Z1152 Encounter for screening for COVID-19: Secondary | ICD-10-CM | POA: Diagnosis not present

## 2023-06-13 DIAGNOSIS — J101 Influenza due to other identified influenza virus with other respiratory manifestations: Secondary | ICD-10-CM | POA: Diagnosis not present

## 2023-06-13 DIAGNOSIS — R509 Fever, unspecified: Secondary | ICD-10-CM | POA: Diagnosis not present

## 2023-06-13 DIAGNOSIS — M791 Myalgia, unspecified site: Secondary | ICD-10-CM | POA: Diagnosis not present

## 2023-06-13 DIAGNOSIS — R0981 Nasal congestion: Secondary | ICD-10-CM | POA: Diagnosis not present

## 2023-06-25 DIAGNOSIS — E039 Hypothyroidism, unspecified: Secondary | ICD-10-CM | POA: Diagnosis not present

## 2023-07-16 ENCOUNTER — Other Ambulatory Visit: Payer: Self-pay

## 2023-07-17 DIAGNOSIS — F3132 Bipolar disorder, current episode depressed, moderate: Secondary | ICD-10-CM | POA: Diagnosis not present

## 2023-07-30 ENCOUNTER — Other Ambulatory Visit: Payer: Self-pay | Admitting: Obstetrics and Gynecology

## 2023-07-30 DIAGNOSIS — Z1231 Encounter for screening mammogram for malignant neoplasm of breast: Secondary | ICD-10-CM

## 2023-08-12 DIAGNOSIS — Z96612 Presence of left artificial shoulder joint: Secondary | ICD-10-CM | POA: Diagnosis not present

## 2023-08-12 DIAGNOSIS — Z471 Aftercare following joint replacement surgery: Secondary | ICD-10-CM | POA: Diagnosis not present

## 2023-09-01 ENCOUNTER — Ambulatory Visit
Admission: RE | Admit: 2023-09-01 | Discharge: 2023-09-01 | Disposition: A | Discharge status: Home / Self Care | Payer: PPO | Source: Ambulatory Visit | Attending: Obstetrics and Gynecology | Admitting: Obstetrics and Gynecology

## 2023-09-01 DIAGNOSIS — Z1231 Encounter for screening mammogram for malignant neoplasm of breast: Secondary | ICD-10-CM

## 2023-10-07 ENCOUNTER — Telehealth: Payer: Self-pay | Admitting: Neurology

## 2023-10-07 NOTE — Telephone Encounter (Signed)
 Pt called in stating the past week her feet have been the worst they have ever been. Especially the top part of her feet. She says she takes a multivitamin and has read that you have to be careful with B vitamins for her problem. She is interested in CBD oil for the pain along with her gabapentin . She would like to find out Dr. Basilio Both thoughts. Ok to leave detailed message.

## 2023-10-07 NOTE — Telephone Encounter (Signed)
 I talked with patient regarding Dr.Hill's response, she thanked me for calling her back.

## 2023-10-15 DIAGNOSIS — F3132 Bipolar disorder, current episode depressed, moderate: Secondary | ICD-10-CM | POA: Diagnosis not present

## 2023-10-20 DIAGNOSIS — I129 Hypertensive chronic kidney disease with stage 1 through stage 4 chronic kidney disease, or unspecified chronic kidney disease: Secondary | ICD-10-CM | POA: Diagnosis not present

## 2023-10-20 DIAGNOSIS — E785 Hyperlipidemia, unspecified: Secondary | ICD-10-CM | POA: Diagnosis not present

## 2023-10-20 DIAGNOSIS — N1831 Chronic kidney disease, stage 3a: Secondary | ICD-10-CM | POA: Diagnosis not present

## 2023-10-20 DIAGNOSIS — E538 Deficiency of other specified B group vitamins: Secondary | ICD-10-CM | POA: Diagnosis not present

## 2023-10-20 DIAGNOSIS — E039 Hypothyroidism, unspecified: Secondary | ICD-10-CM | POA: Diagnosis not present

## 2023-10-20 DIAGNOSIS — M810 Age-related osteoporosis without current pathological fracture: Secondary | ICD-10-CM | POA: Diagnosis not present

## 2023-10-22 DIAGNOSIS — H35033 Hypertensive retinopathy, bilateral: Secondary | ICD-10-CM | POA: Diagnosis not present

## 2023-10-22 DIAGNOSIS — H43813 Vitreous degeneration, bilateral: Secondary | ICD-10-CM | POA: Diagnosis not present

## 2023-10-22 DIAGNOSIS — H25813 Combined forms of age-related cataract, bilateral: Secondary | ICD-10-CM | POA: Diagnosis not present

## 2023-10-22 DIAGNOSIS — Z83511 Family history of glaucoma: Secondary | ICD-10-CM | POA: Diagnosis not present

## 2023-10-27 DIAGNOSIS — Z0000 Encounter for general adult medical examination without abnormal findings: Secondary | ICD-10-CM | POA: Diagnosis not present

## 2023-10-27 DIAGNOSIS — Z1331 Encounter for screening for depression: Secondary | ICD-10-CM | POA: Diagnosis not present

## 2023-10-27 DIAGNOSIS — G3184 Mild cognitive impairment, so stated: Secondary | ICD-10-CM | POA: Diagnosis not present

## 2023-10-27 DIAGNOSIS — R011 Cardiac murmur, unspecified: Secondary | ICD-10-CM | POA: Diagnosis not present

## 2023-10-27 DIAGNOSIS — R0789 Other chest pain: Secondary | ICD-10-CM | POA: Diagnosis not present

## 2023-10-27 DIAGNOSIS — M810 Age-related osteoporosis without current pathological fracture: Secondary | ICD-10-CM | POA: Diagnosis not present

## 2023-10-27 DIAGNOSIS — F1320 Sedative, hypnotic or anxiolytic dependence, uncomplicated: Secondary | ICD-10-CM | POA: Diagnosis not present

## 2023-10-27 DIAGNOSIS — F319 Bipolar disorder, unspecified: Secondary | ICD-10-CM | POA: Diagnosis not present

## 2023-10-27 DIAGNOSIS — D649 Anemia, unspecified: Secondary | ICD-10-CM | POA: Diagnosis not present

## 2023-10-27 DIAGNOSIS — E039 Hypothyroidism, unspecified: Secondary | ICD-10-CM | POA: Diagnosis not present

## 2023-10-27 DIAGNOSIS — R82998 Other abnormal findings in urine: Secondary | ICD-10-CM | POA: Diagnosis not present

## 2023-10-27 DIAGNOSIS — E785 Hyperlipidemia, unspecified: Secondary | ICD-10-CM | POA: Diagnosis not present

## 2023-10-27 DIAGNOSIS — N1831 Chronic kidney disease, stage 3a: Secondary | ICD-10-CM | POA: Diagnosis not present

## 2023-10-27 DIAGNOSIS — Z1389 Encounter for screening for other disorder: Secondary | ICD-10-CM | POA: Diagnosis not present

## 2023-10-27 DIAGNOSIS — I129 Hypertensive chronic kidney disease with stage 1 through stage 4 chronic kidney disease, or unspecified chronic kidney disease: Secondary | ICD-10-CM | POA: Diagnosis not present

## 2023-11-07 DIAGNOSIS — Z1212 Encounter for screening for malignant neoplasm of rectum: Secondary | ICD-10-CM | POA: Diagnosis not present

## 2023-11-11 DIAGNOSIS — H25812 Combined forms of age-related cataract, left eye: Secondary | ICD-10-CM | POA: Diagnosis not present

## 2023-11-25 DIAGNOSIS — E039 Hypothyroidism, unspecified: Secondary | ICD-10-CM | POA: Diagnosis not present

## 2023-11-25 DIAGNOSIS — H268 Other specified cataract: Secondary | ICD-10-CM | POA: Diagnosis not present

## 2023-11-25 DIAGNOSIS — H25812 Combined forms of age-related cataract, left eye: Secondary | ICD-10-CM | POA: Diagnosis not present

## 2023-12-03 DIAGNOSIS — H25811 Combined forms of age-related cataract, right eye: Secondary | ICD-10-CM | POA: Diagnosis not present

## 2023-12-23 DIAGNOSIS — H268 Other specified cataract: Secondary | ICD-10-CM | POA: Diagnosis not present

## 2023-12-23 DIAGNOSIS — H25811 Combined forms of age-related cataract, right eye: Secondary | ICD-10-CM | POA: Diagnosis not present

## 2024-02-05 DIAGNOSIS — M19011 Primary osteoarthritis, right shoulder: Secondary | ICD-10-CM | POA: Diagnosis not present

## 2024-02-05 DIAGNOSIS — M25511 Pain in right shoulder: Secondary | ICD-10-CM | POA: Diagnosis not present

## 2024-02-05 DIAGNOSIS — Z96612 Presence of left artificial shoulder joint: Secondary | ICD-10-CM | POA: Diagnosis not present

## 2024-03-29 NOTE — Patient Instructions (Signed)
 SURGICAL WAITING ROOM VISITATION  Patients having surgery or a procedure may have no more than 2 support people in the waiting area - these visitors may rotate.    Children under the age of 1 must have an adult with them who is not the patient.  Visitors with respiratory illnesses are discouraged from visiting and should remain at home.  If the patient needs to stay at the hospital during part of their recovery, the visitor guidelines for inpatient rooms apply. Pre-op nurse will coordinate an appropriate time for 1 support person to accompany patient in pre-op.  This support person may not rotate.    Please refer to the [facility] website for the visitor guidelines for Inpatients (after your surgery is over and you are in a regular room).       Your procedure is scheduled on:  04/08/2024    Report to [facility] Main Entrance    Report to admitting at   0710AM   Call this number if you have problems the morning of surgery [phone]   Do not eat food :After Midnight.   After Midnight you may have the following liquids until _ 0630_____ AM  DAY OF SURGERY  Water  Non-Citrus Juices (without pulp, NO RED-Apple, White grape, White cranberry) Black Coffee (NO MILK/CREAM OR CREAMERS, sugar ok)  Clear Tea (NO MILK/CREAM OR CREAMERS, sugar ok) regular and decaf                             Plain Jell-O (NO RED)                                           Fruit ices (not with fruit pulp, NO RED)                                     Popsicles (NO RED)                                                               Sports drinks like Gatorade (NO RED)       The day of surgery:  Drink ONE (1) Pre-Surgery Clear Ensure or G2 at  0630AM the morning of surgery. Drink in one sitting. Do not sip.  This drink was given to you during your hospital  pre-op appointment visit. Nothing else to drink after completing the  Pre-Surgery Clear Ensure or G2.          If you have questions, please  contact your surgeon's office.      Oral Hygiene is also important to reduce your risk of infection.                                    Remember - BRUSH YOUR TEETH THE MORNING OF SURGERY WITH YOUR REGULAR TOOTHPASTE  DENTURES WILL BE REMOVED PRIOR TO SURGERY PLEASE DO NOT APPLY Poly grip OR ADHESIVES!!!   Do NOT smoke after Midnight   Stop all vitamins and  herbal supplements 7 days before surgery.   Take these medicines the morning of surgery with A SIP OF WATER :  clonazepam  if needed, gabapentin , lamictal , synthroid    DO NOT TAKE ANY ORAL DIABETIC MEDICATIONS DAY OF YOUR SURGERY  Bring CPAP mask and tubing day of surgery.                              You may not have any metal on your body including hair pins, jewelry, and body piercing             Do not wear make-up, lotions, powders, perfumes/cologne, or deodorant  Do not wear nail polish including gel and S&S, artificial/acrylic nails, or any other type of covering on natural nails including finger and toenails. If you have artificial nails, gel coating, etc. that needs to be removed by a nail salon please have this removed prior to surgery or surgery may need to be canceled/ delayed if the surgeon/ anesthesia feels like they are unable to be safely monitored.   Do not shave  48 hours prior to surgery.               Men may shave face and neck.   Do not bring valuables to the hospital. [facility] IS NOT             RESPONSIBLE   FOR VALUABLES.   Contacts, glasses, dentures or bridgework may not be worn into surgery.   Bring small overnight bag day of surgery.   DO NOT BRING YOUR HOME MEDICATIONS TO THE HOSPITAL. PHARMACY WILL DISPENSE MEDICATIONS LISTED ON YOUR MEDICATION LIST TO YOU DURING YOUR ADMISSION IN THE HOSPITAL!    Patients discharged on the day of surgery will not be allowed to drive home.  Someone NEEDS to stay with you for the first 24 hours after anesthesia.   Special Instructions: Bring a copy of your  healthcare power of attorney and living will documents the day of surgery if you haven't scanned them before.              Please read over the following fact sheets you were given: IF YOU HAVE QUESTIONS ABOUT YOUR PRE-OP INSTRUCTIONS PLEASE CALL [phone].   If you received a COVID test during your pre-op visit  it is requested that you wear a mask when out in public, stay away from anyone that may not be feeling well and notify your surgeon if you develop symptoms. If you test positive for Covid or have been in contact with anyone that has tested positive in the last 10 days please notify you surgeon.      Pre-operative 4 CHG Bath Instructions   You can play a key role in reducing the risk of infection after surgery. Your skin needs to be as free of germs as possible. You can reduce the number of germs on your skin by washing with CHG (chlorhexidine  gluconate) soap before surgery. CHG is an antiseptic soap that kills germs and continues to kill germs even after washing.   DO NOT use if you have an allergy to chlorhexidine /CHG or antibacterial soaps. If your skin becomes reddened or irritated, stop using the CHG and notify one of our RNs at [phone].   Please shower with the CHG soap starting 4 days before surgery using the following schedule:     Please keep in mind the following:  DO NOT shave, including legs and underarms, starting  the day of your first shower.   You may shave your face at any point before/day of surgery.  Place clean sheets on your bed the day you start using CHG soap. Use a clean washcloth (not used since being washed) for each shower. DO NOT sleep with pets once you start using the CHG.   CHG Shower Instructions:  If you choose to wash your hair and private area, wash first with your normal shampoo/soap.  After you use shampoo/soap, rinse your hair and body thoroughly to remove shampoo/soap residue.  Turn the water  OFF and apply about 3 tablespoons (45 ml) of  CHG soap to a CLEAN washcloth.  Apply CHG soap ONLY FROM YOUR NECK DOWN TO YOUR TOES (washing for 3-5 minutes)  DO NOT use CHG soap on face, private areas, open wounds, or sores.  Pay special attention to the area where your surgery is being performed.  If you are having back surgery, having someone wash your back for you may be helpful. Wait 2 minutes after CHG soap is applied, then you may rinse off the CHG soap.  Pat dry with a clean towel  Put on clean clothes/pajamas   If you choose to wear lotion, please use ONLY the CHG-compatible lotions on the back of this paper.     Additional instructions for the day of surgery: DO NOT APPLY any lotions, deodorants, cologne, or perfumes.   Put on clean/comfortable clothes.  Brush your teeth.  Ask your nurse before applying any prescription medications to the skin.      CHG Compatible Lotions   Aveeno Moisturizing lotion  Cetaphil Moisturizing Cream  Cetaphil Moisturizing Lotion  Clairol Herbal Essence Moisturizing Lotion, Dry Skin  Clairol Herbal Essence Moisturizing Lotion, Extra Dry Skin  Clairol Herbal Essence Moisturizing Lotion, Normal Skin  Curel Age Defying Therapeutic Moisturizing Lotion with Alpha Hydroxy  Curel Extreme Care Body Lotion  Curel Soothing Hands Moisturizing Hand Lotion  Curel Therapeutic Moisturizing Cream, Fragrance-Free  Curel Therapeutic Moisturizing Lotion, Fragrance-Free  Curel Therapeutic Moisturizing Lotion, Original Formula  Eucerin Daily Replenishing Lotion  Eucerin Dry Skin Therapy Plus Alpha Hydroxy Crme  Eucerin Dry Skin Therapy Plus Alpha Hydroxy Lotion  Eucerin Original Crme  Eucerin Original Lotion  Eucerin Plus Crme Eucerin Plus Lotion  Eucerin TriLipid Replenishing Lotion  Keri Anti-Bacterial Hand Lotion  Keri Deep Conditioning Original Lotion Dry Skin Formula Softly Scented  Keri Deep Conditioning Original Lotion, Fragrance Free Sensitive Skin Formula  Keri Lotion Fast Absorbing  Fragrance Free Sensitive Skin Formula  Keri Lotion Fast Absorbing Softly Scented Dry Skin Formula  Keri Original Lotion  Keri Skin Renewal Lotion Keri Silky Smooth Lotion  Keri Silky Smooth Sensitive Skin Lotion  Nivea Body Creamy Conditioning Oil  Nivea Body Extra Enriched Lotion  Nivea Body Original Lotion  Nivea Body Sheer Moisturizing Lotion Nivea Crme  Nivea Skin Firming Lotion  NutraDerm 30 Skin Lotion  NutraDerm Skin Lotion  NutraDerm Therapeutic Skin Cream  NutraDerm Therapeutic Skin Lotion  ProShield Protective Hand Cream  Provon moisturizing lotion  [place]- Preparing for Total Shoulder Arthroplasty    Before surgery, you can play an important role. Because skin is not sterile, your skin needs to be as free of germs as possible. You can reduce the number of germs on your skin by using the following products. Benzoyl Peroxide Gel Reduces the number of germs present on the skin Applied twice a day to shoulder area starting two days before surgery    ==================================================================  Please  follow these instructions carefully:  BENZOYL PEROXIDE 5% GEL  Please do not use if you have an allergy to benzoyl peroxide.   If your skin becomes reddened/irritated stop using the benzoyl peroxide.  Starting two days before surgery, apply as follows: Apply benzoyl peroxide in the morning and at night. Apply after taking a shower. If you are not taking a shower clean entire shoulder front, back, and side along with the armpit with a clean wet washcloth.  Place a quarter-sized dollop on your shoulder and rub in thoroughly, making sure to cover the front, back, and side of your shoulder, along with the armpit.   2 days before ____ AM   ____ PM              1 day before ____ AM   ____ PM                         Do this twice a day for two days.  (Last application is the night before surgery, AFTER using the CHG soap as described below).  Do NOT  apply benzoyl peroxide gel on the day of surgery.

## 2024-03-29 NOTE — Progress Notes (Signed)
 Anesthesia Review:  PCP: Cardiologist :  PPM/ ICD: Device Orders: Rep Notified:  Chest x-ray : EKG : Echo : 11/18/22  Stress test: Cardiac Cath :   Activity level:  Sleep Study/ CPAP : Fasting Blood Sugar :      / Checks Blood Sugar -- times a day:    Blood Thinner/ Instructions /Last Dose: ASA / Instructions/ Last Dose :

## 2024-03-31 ENCOUNTER — Encounter (HOSPITAL_COMMUNITY): Payer: Self-pay

## 2024-03-31 ENCOUNTER — Other Ambulatory Visit: Payer: Self-pay

## 2024-03-31 ENCOUNTER — Encounter (HOSPITAL_COMMUNITY)
Admission: RE | Admit: 2024-03-31 | Discharge: 2024-03-31 | Disposition: A | Discharge status: Home / Self Care | Source: Ambulatory Visit | Attending: Orthopedic Surgery | Admitting: Orthopedic Surgery

## 2024-03-31 VITALS — BP 144/77 | HR 61 | Temp 98.4°F | Resp 16 | Ht 65.0 in | Wt 144.0 lb

## 2024-03-31 DIAGNOSIS — M19011 Primary osteoarthritis, right shoulder: Secondary | ICD-10-CM | POA: Diagnosis not present

## 2024-03-31 DIAGNOSIS — E039 Hypothyroidism, unspecified: Secondary | ICD-10-CM | POA: Diagnosis not present

## 2024-03-31 DIAGNOSIS — R9431 Abnormal electrocardiogram [ECG] [EKG]: Secondary | ICD-10-CM | POA: Insufficient documentation

## 2024-03-31 DIAGNOSIS — I10 Essential (primary) hypertension: Secondary | ICD-10-CM | POA: Insufficient documentation

## 2024-03-31 DIAGNOSIS — Z01818 Encounter for other preprocedural examination: Secondary | ICD-10-CM | POA: Insufficient documentation

## 2024-03-31 DIAGNOSIS — F319 Bipolar disorder, unspecified: Secondary | ICD-10-CM | POA: Diagnosis not present

## 2024-03-31 HISTORY — DX: Prediabetes: R73.03

## 2024-03-31 LAB — BASIC METABOLIC PANEL WITH GFR
Anion gap: 10 (ref 5–15)
BUN: 18 mg/dL (ref 8–23)
CO2: 24 mmol/L (ref 22–32)
Calcium: 9.2 mg/dL (ref 8.9–10.3)
Chloride: 101 mmol/L (ref 98–111)
Creatinine, Ser: 0.95 mg/dL (ref 0.44–1.00)
GFR, Estimated: 60 mL/min — ABNORMAL LOW (ref >60)
Glucose, Bld: 95 mg/dL (ref 70–99)
Potassium: 4.6 mmol/L (ref 3.5–5.1)
Sodium: 135 mmol/L (ref 135–145)

## 2024-03-31 LAB — CBC
HCT: 29.4 % — ABNORMAL LOW (ref 36.0–46.0)
Hemoglobin: 9.5 g/dL — ABNORMAL LOW (ref 12.0–15.0)
MCH: 30.7 pg (ref 26.0–34.0)
MCHC: 32.3 g/dL (ref 30.0–36.0)
MCV: 95.1 fL (ref 80.0–100.0)
Platelets: 350 K/uL (ref 150–400)
RBC: 3.09 MIL/uL — ABNORMAL LOW (ref 3.87–5.11)
RDW: 13 % (ref 11.5–15.5)
WBC: 6.6 K/uL (ref 4.0–10.5)
nRBC: 0 % (ref 0.0–0.2)

## 2024-03-31 LAB — SURGICAL PCR SCREEN
MRSA, PCR: NEGATIVE
Staphylococcus aureus: NEGATIVE

## 2024-04-01 NOTE — Progress Notes (Signed)
 Anesthesia Chart Review   Case: [id number] Date/Time: 04/08/24 0925   Procedure: ARTHROPLASTY, SHOULDER, TOTAL, REVERSE (Right: Shoulder)   Anesthesia type: General   Diagnosis: Primary osteoarthritis of right shoulder [M19.011]   Pre-op diagnosis: right shoulder osteoarthritis   Location: [facility]OM 06 / [facility]   Surgeons: Melita Drivers, MD       DISCUSSION:83 y.o. never smoker with h/o bipolar disorder, HTN, hypothyroidism, right shoulder OA scheduled for above procedure 04/08/2024 with Dr. Drivers Melita.   Hemoglobin 9.5, forwarded to PCP and Dr. Melita.   Pt reports she can climb a flight of stairs without chest pain or shortness of breath.  VS: BP (!) 144/77   Pulse 61   Temp 36.9 C (Oral)   Resp 16   Ht 5' 5 (1.651 m)   Wt 65.3 kg   SpO2 99%   BMI 23.96 kg/m   PROVIDERS: Onita Rush, MD is PCP    LABS: Labs reviewed: Acceptable for surgery. (all labs ordered are listed, but only abnormal results are displayed)  Labs Reviewed  CBC - Abnormal; Notable for the following components:      Result Value   RBC 3.09 (*)    Hemoglobin 9.5 (*)    HCT 29.4 (*)    All other components within normal limits  BASIC METABOLIC PANEL WITH GFR - Abnormal; Notable for the following components:   GFR, Estimated 60 (*)    All other components within normal limits  SURGICAL PCR SCREEN     IMAGES:   EKG:   CV: Echocardiogram 11/18/2022:  Hyperdynamic LV systolic function with visual EF >70%. Left ventricle  cavity is normal in size. Normal global wall motion. Indeterminate  diastolic filling pattern, normal LAP. Moderate left ventricular  hypertrophy.  Left atrial cavity is moderately dilated at 45.06 ml/m^2.  Aortic valve sclerosis without stenosis.  Native mitral valve with trace regurgitation.  Mild tricuspid regurgitation. No evidence of pulmonary hypertension.  No prior study for comparison.  Past Medical History:  Diagnosis Date   Anemia    Arthritis    Bipolar  disorder (HCC)    Burning pain    where bladder pushes thru vaginal wall   Cholelithiasis    Colon polyps    Complication of anesthesia    slight trouble turning head side to side   Contusion of right great toe with damage to nail    right toenail black for months   Cystocele    DDD (degenerative disc disease)    Depression    Diverticulosis    Ganglion cyst of right foot    due to bone spurs   Glucose intolerance (impaired glucose tolerance)    Hemorrhoids    History of bladder infections    History of TMJ disorder    no problems in years, wears bite guard when tmj occurs   HOH (hard of hearing)    left more than right, mild   Hyperlipidemia    Hypertension    Hypothyroidism    Peripheral neuropathy    bilateral feet, Dr. tobie   Pre-diabetes    Prolapsed bladder    Spinal stenosis    Tinnitus    left ear drum beat    Past Surgical History:  Procedure Laterality Date   ABDOMINAL HYSTERECTOMY  2002   total   BILIARY DILATION  08/03/2020   Procedure: BILIARY DILATION;  Surgeon: Aneita Gwendlyn DASEN, MD;  Location: [facility] ENDOSCOPY;  Service: Endoscopy;;   CHOLECYSTECTOMY N/A 08/01/2020  Procedure: LAPAROSCOPIC CHOLECYSTECTOMY WITH INTRAOPERATIVE CHOLANGIOGRAM;  Surgeon: Rubin Calamity, MD;  Location: [facility] OR;  Service: General;  Laterality: N/A;   CYSTOCELE REPAIR N/A 12/05/2015   Procedure: CYSTOSCOPY, ANTERIOR REPAIR (CYSTOCELE), VAULT PROLAPSE AND GRAFT;  Surgeon: Glendia Elizabeth, MD;  Location[facility];  Service: Urology;  Laterality: N/A;   ENDOSCOPIC RETROGRADE CHOLANGIOPANCREATOGRAPHY (ERCP) WITH PROPOFOL  N/A 08/03/2020   Procedure: ENDOSCOPIC RETROGRADE CHOLANGIOPANCREATOGRAPHY (ERCP) WITH PROPOFOL ;  Surgeon: Aneita Gwendlyn DASEN, MD;  Location: [facility] ENDOSCOPY;  Service: Endoscopy;  Laterality: N/A;   REMOVAL OF STONES  08/03/2020   Procedure: REMOVAL OF STONES;  Surgeon: Aneita Gwendlyn DASEN, MD;  Location: [facility] ENDOSCOPY;  Service: Endoscopy;;   REVERSE SHOULDER ARTHROPLASTY Left  01/30/2023   Procedure: REVERSE SHOULDER ARTHROPLASTY;  Surgeon: Melita Drivers, MD;  Location[facility];  Service: Orthopedics;  Laterality: Left;    SPHINCTEROTOMY  08/03/2020   Procedure: SPHINCTEROTOMY;  Surgeon: Aneita Gwendlyn DASEN, MD;  Location: [facility] ENDOSCOPY;  Service: Endoscopy;;   TONSILLECTOMY AND ADENOIDECTOMY     as a child   TOTAL KNEE ARTHROPLASTY     bi-lateral 2000,2007   TUBAL LIGATION  1976   VAGINAL PROLAPSE REPAIR N/A 12/05/2015   Procedure: VAGINAL VAULT SUSPENSION;  Surgeon: Glendia Elizabeth, MD;  Location[facility];  Service: Urology;  Laterality: N/A;    MEDICATIONS:  acetaminophen  (TYLENOL ) 500 MG tablet   BERBERINE CHLORIDE PO   Cholecalciferol (D3 VITAMIN PO)   clonazePAM  (KLONOPIN ) 0.5 MG tablet   cyclobenzaprine  (FLEXERIL ) 10 MG tablet   gabapentin  (NEURONTIN ) 600 MG tablet   ibuprofen (ADVIL) 200 MG tablet   lamoTRIgine  (LAMICTAL ) 200 MG tablet   levothyroxine  (SYNTHROID , LEVOTHROID) 112 MCG tablet   lisinopril  (PRINIVIL ,ZESTRIL ) 10 MG tablet   Multiple Vitamins-Minerals (WOMENS MULTIVITAMIN PO)   mupirocin cream (BACTROBAN) 2 %   No current facility-administered medications for this encounter.    Harlene Hoots Ward, PA-C [facility] [phone]

## 2024-04-05 DIAGNOSIS — N3000 Acute cystitis without hematuria: Secondary | ICD-10-CM | POA: Diagnosis not present

## 2024-04-05 DIAGNOSIS — R300 Dysuria: Secondary | ICD-10-CM | POA: Diagnosis not present

## 2024-04-07 NOTE — Anesthesia Preprocedure Evaluation (Addendum)
 Anesthesia Evaluation  Patient identified by MRN, date of birth, ID band Patient awake    Reviewed: Allergy & Precautions, NPO status , Patient's Chart, lab work & pertinent test results  Airway Mallampati: III  TM Distance: >3 FB Neck ROM: Full    Dental no notable dental hx. (+) Teeth Intact, Dental Advisory Given, Chipped,    Pulmonary neg pulmonary ROS   Pulmonary exam normal breath sounds clear to auscultation       Cardiovascular hypertension, (-) angina (-) Past MI Normal cardiovascular exam Rhythm:Regular Rate:Normal  2024 TTE  Hyperdynamic LV systolic function with visual EF >70%. Left ventricle  cavity is normal in size. Normal global wall motion. Indeterminate  diastolic filling pattern, normal LAP. Moderate left ventricular  hypertrophy.  Left atrial cavity is moderately dilated at 45.06 ml/m^2.  Aortic valve sclerosis without stenosis.  Native mitral valve with trace regurgitation.  Mild tricuspid regurgitation. No evidence of pulmonary hypertension.  No prior study for comparison.     Neuro/Psych     Bipolar Disorder      GI/Hepatic   Endo/Other  Hypothyroidism    Renal/GU Lab Results      Component                Value               Date                          K                        4.6                 03/31/2024                CO2                      24                  03/31/2024                BUN                      18                  03/31/2024                CREATININE               0.95                03/31/2024                GFRNONAA                 60 (L)              03/31/2024                  Musculoskeletal  (+) Arthritis , Osteoarthritis,    Abdominal   Peds  Hematology  (+) Blood dyscrasia, anemia Lab Results      Component                Value               Date  WBC                      6.6                 03/31/2024                HGB                       9.5 (L)             03/31/2024                HCT                      29.4 (L)            03/31/2024                MCV                      95.1                03/31/2024                PLT                      350                 03/31/2024              Anesthesia Other Findings All: sulfa, Chlorhexidine , tegretol, cipro   Reproductive/Obstetrics                              Anesthesia Physical Anesthesia Plan  ASA: 3  Anesthesia Plan: General and Regional   Post-op Pain Management: Regional block*, Minimal or no pain anticipated and Ofirmev  IV (intra-op)*   Induction: Intravenous  PONV Risk Score and Plan: 3 and Treatment may vary due to age or medical condition, Ondansetron , Propofol  infusion and TIVA  Airway Management Planned: Oral ETT  Additional Equipment: None  Intra-op Plan:   Post-operative Plan: Extubation in OR  Informed Consent: I have reviewed the patients History and Physical, chart, labs and discussed the procedure including the risks, benefits and alternatives for the proposed anesthesia with the patient or authorized representative who has indicated his/her understanding and acceptance.     Dental advisory given  Plan Discussed with: CRNA and Surgeon  Anesthesia Plan Comments: (GA w R ISB w Exparel )         Anesthesia Quick Evaluation

## 2024-04-08 ENCOUNTER — Encounter (HOSPITAL_COMMUNITY)
Admission: RE | Disposition: A | Discharge status: Home / Self Care | Payer: Self-pay | Source: Ambulatory Visit | Attending: Orthopedic Surgery

## 2024-04-08 ENCOUNTER — Encounter (HOSPITAL_COMMUNITY): Payer: Self-pay | Admitting: Orthopedic Surgery

## 2024-04-08 ENCOUNTER — Ambulatory Visit (HOSPITAL_COMMUNITY)
Admission: RE | Admit: 2024-04-08 | Discharge: 2024-04-08 | Disposition: A | Discharge status: Home / Self Care | Source: Ambulatory Visit | Attending: Orthopedic Surgery | Admitting: Orthopedic Surgery

## 2024-04-08 ENCOUNTER — Ambulatory Visit (HOSPITAL_COMMUNITY): Payer: Self-pay | Admitting: Physician Assistant

## 2024-04-08 ENCOUNTER — Ambulatory Visit (HOSPITAL_BASED_OUTPATIENT_CLINIC_OR_DEPARTMENT_OTHER): Payer: Self-pay | Admitting: Anesthesiology

## 2024-04-08 DIAGNOSIS — Z8249 Family history of ischemic heart disease and other diseases of the circulatory system: Secondary | ICD-10-CM | POA: Insufficient documentation

## 2024-04-08 DIAGNOSIS — Z96612 Presence of left artificial shoulder joint: Secondary | ICD-10-CM | POA: Insufficient documentation

## 2024-04-08 DIAGNOSIS — N390 Urinary tract infection, site not specified: Secondary | ICD-10-CM | POA: Insufficient documentation

## 2024-04-08 DIAGNOSIS — F319 Bipolar disorder, unspecified: Secondary | ICD-10-CM | POA: Insufficient documentation

## 2024-04-08 DIAGNOSIS — I10 Essential (primary) hypertension: Secondary | ICD-10-CM | POA: Diagnosis not present

## 2024-04-08 DIAGNOSIS — E039 Hypothyroidism, unspecified: Secondary | ICD-10-CM | POA: Diagnosis not present

## 2024-04-08 DIAGNOSIS — Z01818 Encounter for other preprocedural examination: Secondary | ICD-10-CM

## 2024-04-08 DIAGNOSIS — M19011 Primary osteoarthritis, right shoulder: Secondary | ICD-10-CM

## 2024-04-08 DIAGNOSIS — G8918 Other acute postprocedural pain: Secondary | ICD-10-CM | POA: Diagnosis not present

## 2024-04-08 DIAGNOSIS — G8929 Other chronic pain: Secondary | ICD-10-CM | POA: Diagnosis not present

## 2024-04-08 HISTORY — PX: REVERSE SHOULDER ARTHROPLASTY: SHX5054

## 2024-04-08 SURGERY — ARTHROPLASTY, SHOULDER, TOTAL, REVERSE
Anesthesia: Regional | Site: Shoulder | Laterality: Right

## 2024-04-08 MED ORDER — PROPOFOL 500 MG/50ML IV EMUL
INTRAVENOUS | Status: DC | PRN
  Administered 2024-04-08: 100 ug/kg/min via INTRAVENOUS

## 2024-04-08 MED ORDER — HYDROMORPHONE HCL 1 MG/ML IJ SOLN: 0.2500 mg | INTRAMUSCULAR | Status: DC | PRN

## 2024-04-08 MED ORDER — PHENYLEPHRINE HCL-NACL 20-0.9 MG/250ML-% IV SOLN
INTRAVENOUS | Status: DC | PRN
  Administered 2024-04-08: 25 ug/min via INTRAVENOUS

## 2024-04-08 MED ORDER — ORAL CARE MOUTH RINSE: 15.0000 mL | Freq: Once | OROMUCOSAL | Status: DC

## 2024-04-08 MED ORDER — CHLORHEXIDINE GLUCONATE 0.12 % MT SOLN: 15.0000 mL | Freq: Once | OROMUCOSAL | Status: DC

## 2024-04-08 MED ORDER — BUPIVACAINE HCL (PF) 0.5 % IJ SOLN
INTRAMUSCULAR | Status: DC | PRN
Start: 2024-04-08 — End: 2024-04-08
  Administered 2024-04-08: 14 mL

## 2024-04-08 MED ORDER — ONDANSETRON HCL 4 MG/2ML IJ SOLN
INTRAMUSCULAR | Status: AC
  Filled 2024-04-08: qty 2

## 2024-04-08 MED ORDER — FENTANYL CITRATE (PF) 50 MCG/ML IJ SOSY: 25.0000 ug | PREFILLED_SYRINGE | INTRAMUSCULAR | Status: DC | PRN

## 2024-04-08 MED ORDER — PROPOFOL 10 MG/ML IV BOLUS
INTRAVENOUS | Status: DC | PRN
  Administered 2024-04-08: 100 mg via INTRAVENOUS

## 2024-04-08 MED ORDER — ONDANSETRON HCL 4 MG/2ML IJ SOLN
INTRAMUSCULAR | Status: DC | PRN
  Administered 2024-04-08: 4 mg via INTRAVENOUS

## 2024-04-08 MED ORDER — CEFAZOLIN SODIUM-DEXTROSE 2-4 GM/100ML-% IV SOLN
2.0000 g | INTRAVENOUS | Status: AC
  Administered 2024-04-08: 2 g via INTRAVENOUS
  Filled 2024-04-08: qty 100

## 2024-04-08 MED ORDER — ROCURONIUM BROMIDE 100 MG/10ML IV SOLN
INTRAVENOUS | Status: DC | PRN
  Administered 2024-04-08: 20 mg via INTRAVENOUS
  Administered 2024-04-08: 50 mg via INTRAVENOUS

## 2024-04-08 MED ORDER — 0.9 % SODIUM CHLORIDE (POUR BTL) OPTIME
TOPICAL | Status: DC | PRN
  Administered 2024-04-08: 1000 mL

## 2024-04-08 MED ORDER — DEXAMETHASONE SODIUM PHOSPHATE 4 MG/ML IJ SOLN
INTRAMUSCULAR | Status: DC | PRN
  Administered 2024-04-08: 5 mg via INTRAVENOUS

## 2024-04-08 MED ORDER — BUPIVACAINE LIPOSOME 1.3 % IJ SUSP
INTRAMUSCULAR | Status: DC | PRN
  Administered 2024-04-08: 10 mL via PERINEURAL

## 2024-04-08 MED ORDER — PROPOFOL 10 MG/ML IV BOLUS
INTRAVENOUS | Status: AC
  Filled 2024-04-08: qty 20

## 2024-04-08 MED ORDER — OXYCODONE HCL 5 MG/5ML PO SOLN: 5.0000 mg | Freq: Once | ORAL | Status: DC | PRN

## 2024-04-08 MED ORDER — ONDANSETRON HCL 4 MG/2ML IJ SOLN: 4.0000 mg | Freq: Once | INTRAMUSCULAR | Status: DC | PRN

## 2024-04-08 MED ORDER — TRANEXAMIC ACID-NACL 1000-0.7 MG/100ML-% IV SOLN
1000.0000 mg | INTRAVENOUS | Status: AC
  Administered 2024-04-08: 1000 mg via INTRAVENOUS
  Filled 2024-04-08: qty 100

## 2024-04-08 MED ORDER — OXYCODONE HCL 5 MG PO TABS: 5.0000 mg | ORAL_TABLET | Freq: Once | ORAL | Status: DC | PRN

## 2024-04-08 MED ORDER — TRANEXAMIC ACID 1000 MG/10ML IV SOLN: 1000.0000 mg | INTRAVENOUS | Status: DC

## 2024-04-08 MED ORDER — ONDANSETRON HCL 4 MG PO TABS: 4.0000 mg | ORAL_TABLET | Freq: Three times a day (TID) | ORAL | 0 refills | Status: DC | PRN

## 2024-04-08 MED ORDER — ACETAMINOPHEN 10 MG/ML IV SOLN: 1000.0000 mg | Freq: Once | INTRAVENOUS | Status: DC | PRN

## 2024-04-08 MED ORDER — AMISULPRIDE (ANTIEMETIC) 5 MG/2ML IV SOLN: 10.0000 mg | Freq: Once | INTRAVENOUS | Status: DC | PRN

## 2024-04-08 MED ORDER — LACTATED RINGERS IV SOLN: INTRAVENOUS | Status: DC | PRN

## 2024-04-08 MED ORDER — VANCOMYCIN HCL 1000 MG IV SOLR
INTRAVENOUS | Status: AC
  Filled 2024-04-08: qty 20

## 2024-04-08 MED ORDER — FENTANYL CITRATE (PF) 50 MCG/ML IJ SOSY
50.0000 ug | PREFILLED_SYRINGE | Freq: Once | INTRAMUSCULAR | Status: AC
  Administered 2024-04-08: 25 ug via INTRAVENOUS
  Filled 2024-04-08: qty 2

## 2024-04-08 MED ORDER — STERILE WATER FOR IRRIGATION IR SOLN
Status: DC | PRN
  Administered 2024-04-08: 2000 mL

## 2024-04-08 MED ORDER — LIDOCAINE HCL (CARDIAC) PF 100 MG/5ML IV SOSY
PREFILLED_SYRINGE | INTRAVENOUS | Status: DC | PRN
  Administered 2024-04-08: 60 mg via INTRAVENOUS

## 2024-04-08 MED ORDER — OXYCODONE-ACETAMINOPHEN 5-325 MG PO TABS: 1.0000 | ORAL_TABLET | ORAL | 0 refills | Status: AC | PRN

## 2024-04-08 MED ORDER — SUCCINYLCHOLINE CHLORIDE 200 MG/10ML IV SOSY
PREFILLED_SYRINGE | INTRAVENOUS | Status: AC
  Filled 2024-04-08: qty 10

## 2024-04-08 MED ORDER — VANCOMYCIN HCL 1000 MG IV SOLR
INTRAVENOUS | Status: DC | PRN
  Administered 2024-04-08: 1000 mg via TOPICAL

## 2024-04-08 MED ORDER — CYCLOBENZAPRINE HCL 10 MG PO TABS: 10.0000 mg | ORAL_TABLET | Freq: Three times a day (TID) | ORAL | 1 refills | Status: DC | PRN

## 2024-04-08 MED ORDER — LACTATED RINGERS IV SOLN: INTRAVENOUS | Status: DC

## 2024-04-08 MED ORDER — SUGAMMADEX SODIUM 200 MG/2ML IV SOLN
INTRAVENOUS | Status: DC | PRN
  Administered 2024-04-08: 200 mg via INTRAVENOUS

## 2024-04-08 SURGICAL SUPPLY — 57 items
BAG COUNTER SPONGE SURGICOUNT (BAG) IMPLANT
BAG ZIPLOCK 12X15 (MISCELLANEOUS) ×2 IMPLANT
BIT DRILL AR 3 NS (BIT) IMPLANT
BLADE SAW SGTL 83.5X18.5 (BLADE) ×2 IMPLANT
BNDG COHESIVE 4X5 TAN STRL LF (GAUZE/BANDAGES/DRESSINGS) ×2 IMPLANT
COOLER ICEMAN CLASSIC (MISCELLANEOUS) ×2 IMPLANT
COVER BACK TABLE 60X90IN (DRAPES) ×2 IMPLANT
COVER SURGICAL LIGHT HANDLE (MISCELLANEOUS) ×2 IMPLANT
CUP SUT UNIV REVERS 36 NEUTRAL (Cup) IMPLANT
DRAPE SHEET LG 3/4 BI-LAMINATE (DRAPES) ×2 IMPLANT
DRAPE SURG 17X11 SM STRL (DRAPES) ×2 IMPLANT
DRAPE SURG ORHT 6 SPLT 77X108 (DRAPES) ×4 IMPLANT
DRAPE TOP 10253 STERILE (DRAPES) ×2 IMPLANT
DRAPE U-SHAPE 47X51 STRL (DRAPES) ×2 IMPLANT
DRESSING AQUACEL AG SP 3.5X6 (GAUZE/BANDAGES/DRESSINGS) ×2 IMPLANT
DURAPREP 26ML APPLICATOR (WOUND CARE) ×2 IMPLANT
ELECT BLADE TIP CTD 4 INCH (ELECTRODE) ×2 IMPLANT
ELECT PENCIL ROCKER SW 15FT (MISCELLANEOUS) ×2 IMPLANT
ELECT REM PT RETURN 15FT ADLT (MISCELLANEOUS) ×2 IMPLANT
FACESHIELD WRAPAROUND OR TEAM (MASK) ×10 IMPLANT
GLENOID UNI REV MOD 24 +2 LAT (Joint) IMPLANT
GLENOSPHERE 36 +4 LAT/24 (Joint) IMPLANT
GLOVE BIO SURGEON STRL SZ7.5 (GLOVE) ×2 IMPLANT
GLOVE BIO SURGEON STRL SZ8 (GLOVE) ×2 IMPLANT
GLOVE SS BIOGEL STRL SZ 7 (GLOVE) ×2 IMPLANT
GLOVE SS BIOGEL STRL SZ 7.5 (GLOVE) ×2 IMPLANT
GOWN STRL SURGICAL XL XLNG (GOWN DISPOSABLE) ×4 IMPLANT
KIT BASIN OR (CUSTOM PROCEDURE TRAY) ×2 IMPLANT
KIT TURNOVER KIT A (KITS) ×2 IMPLANT
LINER HUMERAL 36 +3MM SM (Shoulder) IMPLANT
MANIFOLD NEPTUNE II (INSTRUMENTS) ×2 IMPLANT
NDL TAPERED W/ NITINOL LOOP (MISCELLANEOUS) ×2 IMPLANT
NEEDLE TAPERED W/ NITINOL LOOP (MISCELLANEOUS) ×1 IMPLANT
NS IRRIG 1000ML POUR BTL (IV SOLUTION) ×2 IMPLANT
PACK SHOULDER (CUSTOM PROCEDURE TRAY) ×2 IMPLANT
PAD ARMBOARD POSITIONER FOAM (MISCELLANEOUS) ×2 IMPLANT
PAD COLD SHLDR WRAP-ON (PAD) ×2 IMPLANT
PIN NITINOL TARGETER 2.8 (PIN) IMPLANT
PIN SET MODULAR GLENOID SYSTEM (PIN) IMPLANT
RESTRAINT HEAD UNIVERSAL NS (MISCELLANEOUS) ×2 IMPLANT
SCREW CENTRAL MOD 30MM (Screw) IMPLANT
SCREW PERI LOCK 5.5X24 (Screw) IMPLANT
SCREW PERI LOCK 5.5X36 (Screw) IMPLANT
SCREW PERIPHERAL 5.5X20 LOCK (Screw) IMPLANT
SLING ARM FOAM STRAP LRG (SOFTGOODS) IMPLANT
SLING ARM FOAM STRAP MED (SOFTGOODS) IMPLANT
STEM HUMERAL UNIVERS SZ8 (Stem) IMPLANT
STRIP CLOSURE SKIN 1/2X4 (GAUZE/BANDAGES/DRESSINGS) ×2 IMPLANT
SUT MNCRL AB 3-0 PS2 18 (SUTURE) ×2 IMPLANT
SUT MON AB 2-0 CT1 36 (SUTURE) ×2 IMPLANT
SUT VIC AB 1 CT1 36 (SUTURE) ×2 IMPLANT
SUTURE TAPE 1.3 40 TPR END (SUTURE) ×4 IMPLANT
TOWEL GREEN STERILE FF (TOWEL DISPOSABLE) ×2 IMPLANT
TOWEL OR 17X26 10 PK STRL BLUE (TOWEL DISPOSABLE) ×2 IMPLANT
TUBE SUCTION HIGH CAP CLEAR NV (SUCTIONS) ×2 IMPLANT
TUBING CONNECTING 10 (TUBING) ×2 IMPLANT
WATER STERILE IRR 1000ML POUR (IV SOLUTION) ×4 IMPLANT

## 2024-04-08 NOTE — Transfer of Care (Signed)
 Immediate Anesthesia Transfer of Care Note  Patient: Theresa Underwood  Procedure(s) Performed: ARTHROPLASTY, SHOULDER, TOTAL, REVERSE (Right: Shoulder)  Patient Location: PACU  Anesthesia Type:General Level of Consciousness: awake and alert   Airway & Oxygen  Therapy: Patient Spontanous Breathing and Patient connected to nasal cannula oxygen   Post-op Assessment: Report given to RN and Post -op Vital signs reviewed and stable  Post vital signs: Reviewed and stable  Last Vitals:  Vitals Value Taken Time  BP    Temp    Pulse 60 04/08/24 12:13  Resp 17 04/08/24 12:13  SpO2 100 % 04/08/24 12:13  Vitals shown include unfiled device data.  Last Pain:  Vitals:   04/08/24 0945  TempSrc:   PainSc: 0-No pain         Complications: No notable events documented.

## 2024-04-08 NOTE — Op Note (Signed)
 04/08/2024  12:04 PM  PATIENT:   Theresa Underwood  83 y.o. female  PRE-OPERATIVE DIAGNOSIS:  right shoulder osteoarthritis  POST-OPERATIVE DIAGNOSIS: Same  PROCEDURE: Right shoulder reverse arthroplasty lysing an Arthrex size 8 standard length humeral stem with a neutral metathesis, +3 polyethylene insert, 36/+4 glenosphere and a small/+2 baseplate, 30 mm lag screw  SURGEON:  Amrita Radu, Franky BATTLE M.D.  ASSISTANTS: Randine Ricks, PA-C  Randine Ricks, PA-C was utilized as an Geophysicist/field seismologist throughout this case, essential for help with positioning the patient, positioning extremity, tissue manipulation, implantation of the prosthesis, suture management, wound closure, and intraoperative decision-making.  ANESTHESIA:   General Endotracheal and interscalene block with Exparel   EBL: 100 cc  SPECIMEN: None  Drains: None   PATIENT DISPOSITION:  PACU - hemodynamically stable.    PLAN OF CARE: Discharge to home after PACU  Brief history:  Patient is an 83 year old female well-known to our practice after previous left shoulder reverse arthroplasty that she is done very well with, presenting with chronic and progressively increasing right shoulder pain related to severe osteoarthritis.  She is brought to the operating today for planned right shoulder reverse arthroplasty.  Preoperatively, I counseled the patient regarding treatment options and risks versus benefits thereof.  Possible surgical complications were all reviewed including potential for bleeding, infection, neurovascular injury, persistent pain, loss of motion, anesthetic complication, failure of the implant, and possible need for additional surgery. They understand and accept and agrees with our planned procedure.   Procedure in detail:  After undergoing routine preop evaluation the patient received prophylactic antibiotics and interscalene block with Exparel  was established in the holding area by the anesthesia department.   Subsequently placed spine on the operating table and underwent the smooth induction of a general endotracheal anesthesia.  Placed into the beachchair position and appropriately padded and protected.  The right shoulder girdle region was sterilely prepped and draped in standard fashion.  Timeout was called.  A deltopectoral approach of the right shoulder is made an approximately 7 cm incision.  Skin flaps elevated dissection carried deep in the deltopectoral interval was then developed from proximal to distal with the vein taken laterally.  The conjoined tendon was mobilized and retracted medially.  Adhesions were divided beneath the deltoid.  The long head biceps tendon was then tenodesed at the upper border of the pectoralis major tendon with the proximal segment unroofed and excised.  The rotator cuff superiorly was split from the apex of the bicipital groove to the base of the coracoid and the subscap was then separated from the lesser tuberosity using electrocautery and the free margin was tagged with a pair of grasping suture tape sutures.  Capsular attachments were then divided from the anterior and inferior margins of the humeral neck and humeral head was then delivered through the wound.  An extra medullary guide was then used to outline the proposed humeral head resection which we performed with an oscillating saw at approximate 20 degrees of retroversion.  Marginal osteophytes were removed with rongeurs and the metal cap was placed over the cut proximal humeral surface.  We then exposed the glenoid and performed a circumferential labral resection.  A guidepin was then directed into the center of the glenoid and the glenoid was then reamed with the center followed by the peripheral reamer to a stable subchondral bony bed in preparation completed with a drill and tapped for a 30 mm lag screw.  Our baseplate was then assembled and inserted with vancomycin  powder applied to  the threads of the lag screw and  excellent fixation was achieved.  All of the peripheral locking screws were then placed using standard technique with excellent fixation.  A 36/+4 glenosphere was then impacted onto the baseplate and a central locking screw was placed.  Our attention was then returned back to the humeral canal where the canal was opened by hand reaming we ultimately broached up to a size 8 which matched the opposite side.  A neutral metaphyseal reaming guide was then used to prepare the metaphysis and a trial implant was then placed and trial reduction showed good motion stability and soft tissue balance.  The trial was removed.  Final implant was assembled.  The canal was irrigated cleaned and dried and the final implant was seated with excellent fixation.  Trial reduction showed good motion stability and soft tissue balance with a +3 poly.  The trial was removed.  The final posterior poly was impacted on the implant after was cleaned and dried.  Our final reduction showed excellent motion stability and soft tissue balance all much to our satisfaction.  The wound was copious irrigated.  Final hemostasis was obtained.  Good elasticity of the subscapularis was confirmed and it was repaired back to the eyelets on the collar of the implant using the previously placed suture tape sutures.  Deltopectoral interval was reapproximated with a series of figure-of-eight number Vicryl sutures.  2-0 Monocryl used to close the subcu layer and intracuticular 3-0 Monocryl used to close the skin followed by Steri-Strips and Aquacel dressing.  Right arm placed in a sling.  The patient was awakened, extubated, and taken to the recovery in stable condition.  Franky CHRISTELLA Pointer MD   Contact # [phone]

## 2024-04-08 NOTE — Anesthesia Postprocedure Evaluation (Signed)
 Anesthesia Post Note  Patient: Theresa Underwood  Procedure(s) Performed: ARTHROPLASTY, SHOULDER, TOTAL, REVERSE (Right: Shoulder)     Patient location during evaluation: PACU Anesthesia Type: Regional and General Level of consciousness: awake and alert Pain management: pain level controlled Vital Signs Assessment: post-procedure vital signs reviewed and stable Respiratory status: spontaneous breathing, nonlabored ventilation, respiratory function stable and patient connected to nasal cannula oxygen  Cardiovascular status: blood pressure returned to baseline and stable Postop Assessment: no apparent nausea or vomiting Anesthetic complications: no   No notable events documented.  Last Vitals:  Vitals:   04/08/24 1245 04/08/24 1300  BP: 121/84 (!) 148/72  Pulse: 64 60  Resp: 13 14  Temp:    SpO2: 94% 95%    Last Pain:  Vitals:   04/08/24 1300  TempSrc:   PainSc: 0-No pain                 Garnette DELENA Gab

## 2024-04-08 NOTE — Discharge Instructions (Signed)

## 2024-04-08 NOTE — Anesthesia Procedure Notes (Signed)
 Procedure Name: Intubation Date/Time: 04/08/2024 10:33 AM  Performed by: Dartha Meckel, CRNAPre-anesthesia Checklist: Patient identified, Emergency Drugs available, Suction available and Patient being monitored Patient Re-evaluated:Patient Re-evaluated prior to induction Oxygen  Delivery Method: Circle system utilized Preoxygenation: Pre-oxygenation with 100% oxygen  Induction Type: IV induction Ventilation: Mask ventilation without difficulty Laryngoscope Size: Glidescope and 3 Grade View: Grade I Tube type: Oral Tube size: 7.0 mm Number of attempts: 1 Airway Equipment and Method: Stylet and Oral airway Placement Confirmation: ETT inserted through vocal cords under direct vision, positive ETCO2 and breath sounds checked- equal and bilateral Secured at: 21 cm Tube secured with: Tape Dental Injury: Teeth and Oropharynx as per pre-operative assessment

## 2024-04-08 NOTE — Anesthesia Procedure Notes (Signed)
 Anesthesia Regional Block: Interscalene brachial plexus block   Pre-Anesthetic Checklist: , timeout performed,  Correct Patient, Correct Site, Correct Laterality,  Correct Procedure, Correct Position, site marked,  Risks and benefits discussed,  Surgical consent,  Pre-op evaluation,  At surgeon's request and post-op pain management  Laterality: Upper and Right  Prep: Maximum Sterile Barrier Precautions used, chloraprep       Needles:  Injection technique: Single-shot  Needle Type: Echogenic Needle     Needle Length: 5cm  Needle Gauge: 21     Additional Needles:   Procedures:,,,, ultrasound used (permanent image in chart),,    Narrative:  Start time: 04/08/2024 9:32 AM End time: 04/08/2024 9:37 AM Injection made incrementally with aspirations every 5 mL.  Performed by: Personally  Anesthesiologist: Jefm Garnette LABOR, MD  Additional Notes: Block assessed prior to procedure. Patient tolerated procedure well.

## 2024-04-08 NOTE — H&P (Signed)
 Theresa Underwood    Chief Complaint: right shoulder osteoarthritis HPI: The patient is a 83 y.o. female well-known to our practice after a previous left shoulder reverse arthroplasty that we performed in August 2024 which she has done extremely well with, presenting today with chronic and progressive increasing right shoulder pain related to severe osteoarthritis and associated rotator cuff dysfunction.  Plan for right shoulder reverse arthroplasty.  Patient shares with us  this morning that over the weekend she developed dysuria for which she went to a local urgent care and apparently she had a urinalysis and was felt to have evidence for UTI.  She was placed on an oral antibiotic.  She states that her maximum temperature was 99.6.  She denies any chills or sensations of systemic illness and currently is afebrile.  She is on her third day of a 5-day course of oral antibiotics.  Past Medical History:  Diagnosis Date   Anemia    Arthritis    Bipolar disorder (HCC)    Burning pain    where bladder pushes thru vaginal wall   Cholelithiasis    Colon polyps    Complication of anesthesia    slight trouble turning head side to side   Contusion of right great toe with damage to nail    right toenail black for months   Cystocele    DDD (degenerative disc disease)    Depression    Diverticulosis    Ganglion cyst of right foot    due to bone spurs   Glucose intolerance (impaired glucose tolerance)    Hemorrhoids    History of bladder infections    History of TMJ disorder    no problems in years, wears bite guard when tmj occurs   HOH (hard of hearing)    left more than right, mild   Hyperlipidemia    Hypertension    Hypothyroidism    Peripheral neuropathy    bilateral feet, Dr. tobie   Pre-diabetes    Prolapsed bladder    Spinal stenosis    Tinnitus    left ear drum beat      Past Surgical History:  Procedure Laterality Date   ABDOMINAL HYSTERECTOMY  2002   total   BILIARY  DILATION  08/03/2020   Procedure: BILIARY DILATION;  Surgeon: Aneita Gwendlyn DASEN, MD;  Location: [facility] ENDOSCOPY;  Service: Endoscopy;;   CHOLECYSTECTOMY N/A 08/01/2020   Procedure: LAPAROSCOPIC CHOLECYSTECTOMY WITH INTRAOPERATIVE CHOLANGIOGRAM;  Surgeon: Rubin Calamity, MD;  Location: [facility] OR;  Service: General;  Laterality: N/A;   CYSTOCELE REPAIR N/A 12/05/2015   Procedure: CYSTOSCOPY, ANTERIOR REPAIR (CYSTOCELE), VAULT PROLAPSE AND GRAFT;  Surgeon: Glendia Elizabeth, MD;  Location[facility];  Service: Urology;  Laterality: N/A;   ENDOSCOPIC RETROGRADE CHOLANGIOPANCREATOGRAPHY (ERCP) WITH PROPOFOL  N/A 08/03/2020   Procedure: ENDOSCOPIC RETROGRADE CHOLANGIOPANCREATOGRAPHY (ERCP) WITH PROPOFOL ;  Surgeon: Aneita Gwendlyn DASEN, MD;  Location: [facility] ENDOSCOPY;  Service: Endoscopy;  Laterality: N/A;   REMOVAL OF STONES  08/03/2020   Procedure: REMOVAL OF STONES;  Surgeon: Aneita Gwendlyn DASEN, MD;  Location: [facility] ENDOSCOPY;  Service: Endoscopy;;   REVERSE SHOULDER ARTHROPLASTY Left 01/30/2023   Procedure: REVERSE SHOULDER ARTHROPLASTY;  Surgeon: Melita Drivers, MD;  Location: [facility];  Service: Orthopedics;  Laterality: Left;    SPHINCTEROTOMY  08/03/2020   Procedure: SPHINCTEROTOMY;  Surgeon: Aneita Gwendlyn DASEN, MD;  Location: [facility] ENDOSCOPY;  Service: Endoscopy;;   TONSILLECTOMY AND ADENOIDECTOMY     as a child   TOTAL KNEE ARTHROPLASTY  bi-lateral [id number]   TUBAL LIGATION  1976   VAGINAL PROLAPSE REPAIR N/A 12/05/2015   Procedure: VAGINAL VAULT SUSPENSION;  Surgeon: Glendia Elizabeth, MD;  Location: [facility];  Service: Urology;  Laterality: N/A;    Family History  Problem Relation Age of Onset   Bipolar disorder Mother    Osteoporosis Mother    Colon polyps Mother    Stroke Mother        Deceased, 52   Neuropathy Mother    Peripheral vascular disease Maternal Grandmother    Pancreatic cancer Maternal Grandmother    Colon polyps Father    Lung disease Father        Deceased, 55   Diverticulitis Sister     Neuropathy Maternal Grandfather    Colon cancer Neg Hx    Breast cancer Neg Hx     Social History:  reports that she has never smoked. She has never used smokeless tobacco. She reports current alcohol  use. She reports that she does not use drugs.  BMI: Estimated body mass index is 23.96 kg/m as calculated from the following:   Height as of 03/31/24: 5' 5 (1.651 m).   Weight as of 03/31/24: 65.3 kg.  Lab Results  Component Value Date   ALBUMIN 2.6 (L) 08/04/2020   Diabetes: Patient does not have a diagnosis of diabetes.     Smoking Status:   reports that she has never smoked. She has never used smokeless tobacco.     No medications prior to admission.     Physical Exam: Right shoulder demonstrates profoundly restricted motion with severe pain and globally decreased strength is noted at her recent office visits.  She is otherwise neurovascular intact.  Imaging studies confirm severe right shoulder glenohumeral arthritis with complete loss of joint space, subchondral sclerosis, and peripheral osteophyte formation.  Vitals     Assessment/Plan  Impression: right shoulder osteoarthritis  Patient also reports recent UTI for which she is on oral and biotics day 3 of anticipated 5-day course.  She reports feeling overall well with no constitutional symptoms.  I did review with her and her daughter there is issues regarding the potential for bacteremia with her UTI.  This time I feel that the chances are exceedingly small that there is a significantly increased risk of infection about the planned right shoulder arthroplasty.  We reviewed the various issues and at this time by way of mutual decision making we will plan to move forward with right reverse shoulder arthroplasty as planned.  Plan of Action: Procedure(s): ARTHROPLASTY, SHOULDER, TOTAL, REVERSE  Shawntell Dixson M Kelcy Baeten 04/08/2024, 5:58 AM Contact # [phone]

## 2024-04-08 NOTE — Care Plan (Signed)
 Ortho Bundle Case Management Note  Patient Details  Name: Theresa Underwood MRN: [id number] Date of Birth: Nov 11, 1940  Rt Reverse Shoulder Arthroplasty on 04/08/24  DCP: Home with husband and daughter ? DME: No needs ? PT: EO                   DME Arranged:  N/A DME Agency:  NA  HH Arranged:    HH Agency:     Additional Comments: Please contact me with any questions of if this plan should need to change.  Burnard Dross, Case Manager [facility]   [phone]  Ext. [phone]   04/08/2024, 8:13 AM

## 2024-04-08 NOTE — Evaluation (Signed)
 Occupational Therapy Evaluation Patient Details Name: Theresa Underwood MRN: [id number] DOB: 01-21-1941 Today's Date: 04/08/2024   History of Present Illness   Patient is an 83 year old female s/p R Reverse Shoulder Arthroplasty on 04/08/24. PMH: L Revesre TSA in 2024.     Clinical Impressions PTA pt lives with husband and has daughter present for session.  Education completed regarding compensatory strategies for ADL tasks and functional mobility, management of sling, R ROM per specified parameters in the order set as indicated below, positioning of operative arm in sitting and supine and edema control, including use of Iceman Cold Therapy machine. Caregiver present for education, written handouts provided and reviewed using Teach Back and pt/caregiver verbalized/demonstrated understanding. Due to the below listed deficits, pt requires minimal  assistance with ADL tasks and CGA assist with functional mobility on level surfaces and daughter trained in gait belt use for safety with + teach back. Caregiver will be able to provide necessary level of assistance at discharge. Pt to follow up with MD to progress rehab of the operative shoulder.       If plan is discharge home, recommend the following:   A little help with walking and/or transfers;A little help with bathing/dressing/bathroom;Assistance with cooking/housework;Assist for transportation;Help with stairs or ramp for entrance     Functional Status Assessment   Patient has had a recent decline in their functional status and demonstrates the ability to make significant improvements in function in a reasonable and predictable amount of time.     Equipment Recommendations   None recommended by OT      Precautions/Restrictions   Precautions Precautions: Shoulder Shoulder FF 0-60; ER 0-20; Abd 0-45. ROM for ADL purposes only; AROM elbow/wrist/hand; OK for lap slides and pendulums; Loosen sling from neck when arm is supported  in sitting  Precaution Booklet Issued: Yes (comment) Required Braces or Orthoses: Sling Restrictions Weight Bearing Restrictions Per Provider Order: Yes RUE Weight Bearing Per Provider Order: Non weight bearing     Mobility Bed Mobility Overal bed mobility:  (patient was up in recliner and remained)                  Transfers Overall transfer level: Needs assistance                        Balance Overall balance assessment: Mild deficits observed, not formally tested                                         ADL either performed or assessed with clinical judgement   ADL Overall ADL's : Needs assistance/impaired      Per orders, R shoulder parameters as follows for ADL tasks: Abd 0-45; ER 0-20; FF 0-60. While moving within specified parameters, pt/caregiver instructed on bathing and how to donn/doff shirt, placing operative arm through sleeve first when donning and off last when doffing.Pt/caregiver educated on compensatory strategies for LB ADL and strategies to reduce risk of falls.  Pt/caregiver educated on donning/doffing sling and to wear the sling at all times with the exception of ADL, and to loosen the neck strap of the sling when the operative arm is in a supported position when sitting. In sitting or supine, pt instructed to have a pillow behind and under their operative arm to provide support. If assist needed with ambulation, caregiver educated on the importance of  walking on pt's non-operative side.  Education regarding use of IceMan Cold Therapy completed, including the importance of using a barrier on the shoulder prior to positioning the wrap-on pad. Pt/caregiver verbalized/demonstrated understanding. Teach Back used while caregiver assisted with dressing pt and positioning wrap-on pad to facilitate DC.                                        Vision Baseline Vision/History: 0 No visual deficits               Pertinent Vitals/Pain Pain Assessment Pain Assessment: No/denies pain     Extremity/Trunk Assessment Upper Extremity Assessment Upper Extremity Assessment: Right hand dominant;RUE deficits/detail RUE Deficits / Details: R UE operative n block active, WFL AAROM elbow, wrist, hand RUE Coordination: decreased fine motor;decreased gross motor   Lower Extremity Assessment Lower Extremity Assessment: Overall WFL for tasks assessed   Cervical / Trunk Assessment Cervical / Trunk Assessment: Normal   Communication Communication Communication: No apparent difficulties   Cognition Arousal: Alert Behavior During Therapy: WFL for tasks assessed/performed Cognition: No apparent impairments                               Following commands: Intact       Cueing  General Comments   Cueing Techniques: Verbal cues  R pot op incision covered and intact   Exercises Exercises: Shoulder Shoulder Exercises Pendulum Exercise: 10 reps, AAROM, Right Elbow Flexion: AAROM, Right, 10 reps Elbow Extension: AAROM, Right, 10 reps Wrist Flexion: AAROM, Right, 10 reps Wrist Extension: AAROM, Right, 10 reps Digit Composite Flexion: AAROM, Right, 10 reps Composite Extension: AAROM, Right, 10 reps   Shoulder Instructions Shoulder Instructions Donning/doffing shirt without moving shoulder: Caregiver independent with task;Patient able to independently direct caregiver Method for sponge bathing under operated UE: Caregiver independent with task;Patient able to independently direct caregiver Donning/doffing sling/immobilizer: Caregiver independent with task;Patient able to independently direct caregiver Correct positioning of sling/immobilizer: Caregiver independent with task;Patient able to independently direct caregiver Pendulum exercises (written home exercise program): Caregiver independent with task;Patient able to independently direct caregiver ROM for elbow, wrist and digits of  operated UE: Caregiver independent with task;Patient able to independently direct caregiver Sling wearing schedule (on at all times/off for ADL's): Caregiver independent with task;Patient able to independently direct caregiver Proper positioning of operated UE when showering: Caregiver independent with task;Patient able to independently direct caregiver Positioning of UE while sleeping: Caregiver independent with task;Patient able to independently direct caregiver    Home Living Family/patient expects to be discharged to:: Private residence Living Arrangements: Spouse/significant other Available Help at Discharge: Family Type of Home: House Home Access: Stairs to enter Secretary/administrator of Steps: 2   Home Layout: One level     Bathroom Shower/Tub: Tub/shower unit;Walk-in shower   Bathroom Toilet: Handicapped height Bathroom Accessibility: Yes How Accessible: Accessible via walker Home Equipment: Rollator (4 wheels);Cane - single point;BSC/3in1;Shower seat          Prior Functioning/Environment Prior Level of Function : Independent/Modified Independent             Mobility Comments: independent ADLs Comments: independent    OT Problem List: Impaired UE functional use    AM-PAC OT 6 Clicks Daily Activity     Outcome Measure Help from another person eating meals?: None Help from another person taking care  of personal grooming?: None Help from another person toileting, which includes using toliet, bedpan, or urinal?: A Little Help from another person bathing (including washing, rinsing, drying)?: A Little Help from another person to put on and taking off regular upper body clothing?: A Little Help from another person to put on and taking off regular lower body clothing?: A Little 6 Click Score: 20   End of Session Equipment Utilized During Treatment: Gait belt;Other (comment) (sling) Nurse Communication: Mobility status;Weight bearing  status;Precautions  Activity Tolerance: Patient tolerated treatment well Patient left: in chair;with call bell/phone within reach;with chair alarm set;with family/visitor present  OT Visit Diagnosis: Other (comment) (R UE dysfunction)                Time: 1320-1400 OT Time Calculation (min): 40 min Charges:  OT General Charges $OT Visit: 1 Visit OT Evaluation $OT Eval Low Complexity: 1 Low OT Treatments $Self Care/Home Management : 8-22 mins  Mikhala Kenan OT/L Acute Rehabilitation Department  [phone] 04/08/2024, 4:01 PM

## 2024-04-09 ENCOUNTER — Encounter (HOSPITAL_COMMUNITY): Payer: Self-pay | Admitting: Orthopedic Surgery

## 2024-04-14 DIAGNOSIS — F3132 Bipolar disorder, current episode depressed, moderate: Secondary | ICD-10-CM | POA: Diagnosis not present

## 2024-04-26 DIAGNOSIS — Z5189 Encounter for other specified aftercare: Secondary | ICD-10-CM | POA: Diagnosis not present

## 2024-04-26 DIAGNOSIS — Z96611 Presence of right artificial shoulder joint: Secondary | ICD-10-CM | POA: Diagnosis not present

## 2024-05-05 DIAGNOSIS — R29898 Other symptoms and signs involving the musculoskeletal system: Secondary | ICD-10-CM | POA: Diagnosis not present

## 2024-05-05 DIAGNOSIS — M25611 Stiffness of right shoulder, not elsewhere classified: Secondary | ICD-10-CM | POA: Diagnosis not present

## 2024-05-05 DIAGNOSIS — M25511 Pain in right shoulder: Secondary | ICD-10-CM | POA: Diagnosis not present

## 2024-05-11 DIAGNOSIS — M25611 Stiffness of right shoulder, not elsewhere classified: Secondary | ICD-10-CM | POA: Diagnosis not present

## 2024-05-11 DIAGNOSIS — M25511 Pain in right shoulder: Secondary | ICD-10-CM | POA: Diagnosis not present

## 2024-05-11 DIAGNOSIS — R29898 Other symptoms and signs involving the musculoskeletal system: Secondary | ICD-10-CM | POA: Diagnosis not present

## 2024-05-17 ENCOUNTER — Ambulatory Visit: Payer: PPO | Admitting: Neurology

## 2024-05-17 DIAGNOSIS — M25611 Stiffness of right shoulder, not elsewhere classified: Secondary | ICD-10-CM | POA: Diagnosis not present

## 2024-05-17 DIAGNOSIS — R29898 Other symptoms and signs involving the musculoskeletal system: Secondary | ICD-10-CM | POA: Diagnosis not present

## 2024-05-17 DIAGNOSIS — M25511 Pain in right shoulder: Secondary | ICD-10-CM | POA: Diagnosis not present

## 2024-05-17 NOTE — Progress Notes (Deleted)
 Follow-up Visit   Date: 05/17/24    Theresa Underwood MRN: [id number] DOB: 24-Jun-1940   Interim History: Theresa Underwood is a 83 y.o. right-handed Caucasian female with history of hyperlipidemia, hypertension, hypothyroidism, bipolar affective disorder s/p ECT, borderline diabetes type 2, lumbar spinal stenosis, here for follow-up of hereditary peripheral neuropathy.  The patient was accompanied to the clinic by self.  History of present illness: In the early 1990s, she developed gradual onset of numbness involving the tips of her toes, described as a tight sensation over the feet. Over the years, her symptoms have progressed and now she gets numbness and prickly sensation over her lower legs (below the knee) and into her feet. Symptoms are worse when she rests and alleviated with neurontin . Of late, she has developed cold sensation of her feet and often puts them in a warm bath which helps. She takes neurontin  600mg  TID (8am, 1pm, 12am) and and lamictal  150mg  (for depression). She denies any fall and is ambulating independently. She has intermittent pain and tinging of the hands, but denies any weakness. Previously tried medications include metanx, Cymbalta, and Lyrica. She also has hammertoes and history of similar symptoms involving her mother, sister, father, and maternal grandfather. None of her family members were wheelchair-bound. Her mother has numbness/tingling of the feet and ambulated independently until late 80s then transitioned to a cane/walker due to spinal stenosis.   She was initially under the care of Dr. Charlyn at [facility] who diagnosed her with hereditary peripheral neuropathy in 1992 and was until his care until 1995 at which time he left the practice and transitioned care to Dr. Maurice and since his retirement was last seen by Dr. Onita in May 2014. Her last clinic note dated 11/05/2012 was reviewed and summarized as follows:  Initial EMG in July 1996 and January 1998 showed sensory  and motor polyneuropathy with predominantly demyelinating features. Subsequent electrodiagnostic studies in August 2001 and December 2007 was more consistent with "an axonal 'small fiber' peripheral neuropathy". There was no evidence of a lumbosacral radiculopathy.   EMG of the arms shows cervical radiculopathy affecting right C5, left C7, and bilateral T1 nerve roots.  There is no evidence of neuropathy or CTS.  Her neuropathic pain is well-controlled neurontin  600mg  TID.    In 2016, she started having memory issues, especially with short-term recall, such as trying to remember a telephone number.  She is very active and does her own IADLs. No wording finding problems, but has been told that she repeats herself.  She has a history of bipolar for which she takes lamictal .  Denies any depressive symptoms.   UPDATE 05/19/2023:  She is here for follow-up visit. She continues to have painful neuropathy involving the lower legs and feet.  She takes gabapentin  600mg  three times daily which helps, but she continues to have some days where the pain is severe.  She walks unassisted, balance is fair.  No falls.  She travelled to [place] and [place] last year and enjoyed the trip, although travel there was tiring.  She has some word-finding difficulty, but is able to keep up with finances, medications, and driving.  She is seeing an eye doctor for cataracts and had evaluation for right blepharoplasty.  UPDATE 05/17/2024:  *** Medications:  Current Outpatient Medications on File Prior to Visit  Medication Sig Dispense Refill   BERBERINE CHLORIDE PO Take 1 capsule by mouth in the morning and at bedtime. On Hold     Cholecalciferol (  D3 VITAMIN PO) Take 2,000 Units by mouth daily.     clonazePAM  (KLONOPIN ) 0.5 MG tablet Take 0.5 mg by mouth 2 (two) times daily as needed for anxiety.     cyclobenzaprine  (FLEXERIL ) 10 MG tablet Take 1 tablet (10 mg total) by mouth 3 (three) times daily as needed for muscle spasms. 30  tablet 1   gabapentin  (NEURONTIN ) 600 MG tablet TAKE 1 TABLET(600 MG) BY MOUTH THREE TIMES . OK to take extra dose as needed. 290 tablet 3   ibuprofen (ADVIL) 200 MG tablet Take 200-400 mg by mouth every 6 (six) hours as needed for moderate pain.     lamoTRIgine  (LAMICTAL ) 200 MG tablet Take 200 mg by mouth daily.     levothyroxine  (SYNTHROID , LEVOTHROID) 112 MCG tablet Take 112 mcg by mouth at bedtime.     lisinopril  (PRINIVIL ,ZESTRIL ) 10 MG tablet Take 10 mg by mouth daily.     Multiple Vitamins-Minerals (WOMENS MULTIVITAMIN PO) Take 1 tablet by mouth daily.     mupirocin cream (BACTROBAN) 2 % Apply 1 Application topically daily as needed (irritation).     ondansetron  (ZOFRAN ) 4 MG tablet Take 1 tablet (4 mg total) by mouth every 8 (eight) hours as needed for nausea or vomiting. 10 tablet 0   oxyCODONE -acetaminophen  (PERCOCET) 5-325 MG tablet Take 1 tablet by mouth every 4 (four) hours as needed (max 6 q). 20 tablet 0   No current facility-administered medications on file prior to visit.    Allergies:  Allergies  Allergen Reactions   Sulfa Antibiotics Hives   Carbamazepine Hives    REACTION: hives with tegretol   Ciprofloxacin      Pt has nerve damage in both of her feet   Hibiclens  [Chlorhexidine  Gluconate] Other (See Comments)    Hibiclens  caused vaginal and perineal irritation per pt    Sulfonamide Derivatives Hives     Vital Signs:  There were no vitals taken for this visit. Neurological Exam:    MENTAL STATUS including orientation to time, place, person, recent and remote memory, attention span and concentration, language, and fund of knowledge is normal.  Speech is not dysarthric.   CRANIAL NERVES:  Normal conjugate, extra-ocular eye movements in all directions of gaze.  Mild right ptosis, no worsening with sustained upgaze.   MOTOR:  Motor strength is 5/5 in all extremities, except interosseus muscles 5-/5, ABP 4+/5, and toe extensors 5-/5 bilaterally.  Tone is normal.      REFLEXES:  Reflexes are 3+/4 throughout, except absent Achilles bilaterally.  SENSORY:  Vibration absent at the toes bilaterally, trace at the ankles, normal at the knees and MCP.    COORDINATION/GAIT:  Gait narrow based and stable, unassisted.  Data: EMG 10/25/2013:  There is electrophysiological evidence of a multilevel cervical radiculopathy affecting the right C5, left C7, and bilateral T1 nerve roots/segment, overall mild-to-moderate degree electrically. There is no evidence generalized sensorimotor polyneuropathy or carpal tunnel syndrome affecting the upper extremities.  Labs 10/05/2013:  Vitamin B12 291*, SPEP/UPEP with IFE - no M protein, copper  139  MRI of the lumbar spine without contrast 12/07/2004: Multilevel spondylosis, advanced at L3-4 L5-S1, and moderate to mild spondylosis L3-4 and mild spinal stenosis at L4-5. An osteophyte at L5-S1 might encroach the extraforaminal portion of the left L5 nerve root. There is no disc herniation.      IMPRESSION/PLAN: 1.  Peripheral neuropathy affecting bilateral lower extremities with progression.  She has a strong family history of neuropathy.    - Genetic testing ***  -  Continue gabapentin  600mg  three times daily. OK to take an extra tablet daily as needed  - Fall precautions discussed  2.  Mild cognitive impairment, stable and most likely contributed by anxiety.  No signs of dementia.  She remains independent with IADLs and ADLs.   Return to clinic in 1 year ***   Thank you for allowing me to participate in patient's care.  If I can answer any additional questions, I would be pleased to do so.    Sincerely,    Osiris Charles K. Tobie, DO

## 2024-05-20 DIAGNOSIS — M25511 Pain in right shoulder: Secondary | ICD-10-CM | POA: Diagnosis not present

## 2024-05-20 DIAGNOSIS — M25611 Stiffness of right shoulder, not elsewhere classified: Secondary | ICD-10-CM | POA: Diagnosis not present

## 2024-05-20 DIAGNOSIS — R29898 Other symptoms and signs involving the musculoskeletal system: Secondary | ICD-10-CM | POA: Diagnosis not present

## 2024-05-25 DIAGNOSIS — R29898 Other symptoms and signs involving the musculoskeletal system: Secondary | ICD-10-CM | POA: Diagnosis not present

## 2024-05-25 DIAGNOSIS — M25611 Stiffness of right shoulder, not elsewhere classified: Secondary | ICD-10-CM | POA: Diagnosis not present

## 2024-05-25 DIAGNOSIS — M25511 Pain in right shoulder: Secondary | ICD-10-CM | POA: Diagnosis not present

## 2024-05-27 DIAGNOSIS — M25511 Pain in right shoulder: Secondary | ICD-10-CM | POA: Diagnosis not present

## 2024-05-27 DIAGNOSIS — M25611 Stiffness of right shoulder, not elsewhere classified: Secondary | ICD-10-CM | POA: Diagnosis not present

## 2024-05-27 DIAGNOSIS — R29898 Other symptoms and signs involving the musculoskeletal system: Secondary | ICD-10-CM | POA: Diagnosis not present

## 2024-06-01 ENCOUNTER — Encounter: Payer: Self-pay | Admitting: Neurology

## 2024-06-01 ENCOUNTER — Ambulatory Visit: Admitting: Neurology

## 2024-06-01 VITALS — BP 130/76 | HR 70 | Ht 65.0 in | Wt 148.0 lb

## 2024-06-01 DIAGNOSIS — M4306 Spondylolysis, lumbar region: Secondary | ICD-10-CM

## 2024-06-01 DIAGNOSIS — R292 Abnormal reflex: Secondary | ICD-10-CM

## 2024-06-01 DIAGNOSIS — G629 Polyneuropathy, unspecified: Secondary | ICD-10-CM

## 2024-06-01 MED ORDER — GABAPENTIN 600 MG PO TABS: ORAL_TABLET | ORAL | 3 refills | Status: AC

## 2024-06-01 NOTE — Patient Instructions (Addendum)
 It was great to see you today!  Continue to take gabapentin  600mg  three times daily  Recommend that you install hand rails in the bathroom  Start to use a walking stick to help anytime you are on uneven ground (mulch, gravel, inclines)  If you want would like to do physical therapy for balance, let me know.

## 2024-06-01 NOTE — Progress Notes (Signed)
 Follow-up Visit   Date: 06/01/2024    Theresa Underwood MRN: [id number] DOB: Nov 08, 1940   Interim History: Theresa Underwood is a 83 y.o. right-handed Caucasian female with history of hyperlipidemia, hypertension, hypothyroidism, bipolar affective disorder s/p ECT, borderline diabetes type 2, lumbar spinal stenosis, here for follow-up of hereditary peripheral neuropathy.  The patient was accompanied to the clinic by self.   IMPRESSION/PLAN: 1.  Peripheral neuropathy manifesting with bilateral leg paresthesia and also having some sensory ataxia. She has a strong family history of neuropathy.  Genetic testing has been deferred.   - Continue gabapentin  600mg  three times daily.  - Fall precautions discussed.  Recommend using walking stick on uneven ground  - PT declined, as she is currently doing PT for her right shoulder  - Encouraged to add grab bars in the bathroom  2.  Mild cognitive impairment, stable.  No signs of dementia.  She remains independent with IADLs and ADLs.   3.  Chronic low back pain.  Exam shows hyperreflexia at the patella which may indicate lumbar canal stenosis.  She may consider PT for low back strengthening after completed PT for right shoulder.   Return to clinic in 1 year   -------------------------------------------------------------------------- History of present illness: In the early 1990s, she developed gradual onset of numbness involving the tips of her toes, described as a tight sensation over the feet. Over the years, her symptoms have progressed and now she gets numbness and prickly sensation over her lower legs (below the knee) and into her feet. Symptoms are worse when she rests and alleviated with neurontin .  She takes neurontin  600mg  TID (8am, 1pm, 12am) and and lamictal  150mg  (for depression). She denies any fall and is ambulating independently. She has intermittent pain and tinging of the hands, but denies any weakness. Previously tried  medications include metanx, Cymbalta, and Lyrica. She also has hammertoes and history of similar symptoms involving her mother, sister, father, and maternal grandfather. None of her family members were wheelchair-bound. Her mother has numbness/tingling of the feet and ambulated independently until late 80s then transitioned to a cane/walker due to spinal stenosis.   She was initially under the care of Dr. Charlyn at [facility] who diagnosed her with hereditary peripheral neuropathy in 1992 and was until his care until 1995 at which time he left the practice and transitioned care to Dr. Maurice and since his retirement was last seen by Dr. Onita in May 2014. Her last clinic note dated 11/05/2012 was reviewed and summarized as follows:  Initial EMG in July 1996 and January 1998 showed sensory and motor polyneuropathy with predominantly demyelinating features. Subsequent electrodiagnostic studies in August 2001 and December 2007 was more consistent with an axonal small fiber peripheral neuropathy. There was no evidence of a lumbosacral radiculopathy.   EMG of the arms shows cervical radiculopathy affecting right C5, left C7, and bilateral T1 nerve roots.  There is no evidence of neuropathy or CTS.  Her neuropathic pain is well-controlled neurontin  600mg  TID.    In 2016, she started having memory issues, especially with short-term recall, such as trying to remember a telephone number.  She is very active and does her own IADLs. No wording finding problems, but has been told that she repeats herself.  She has a history of bipolar for which she takes lamictal .  Denies any depressive symptoms.   UPDATE 05/19/2023:  She is here for follow-up visit. She continues to have painful neuropathy involving the lower legs and feet.  She takes gabapentin  600mg  three times daily which helps, but she continues to have some days where the pain is severe.  She walks unassisted, balance is fair.  No falls.  She travelled to [place] and  [place] last year and enjoyed the trip, although travel there was tiring.  She has some word-finding difficulty, but is able to keep up with finances, medications, and driving.  She is seeing an eye doctor for cataracts and had evaluation for right blepharoplasty.  UPDATE 05/17/2024:  Discussed the use of AI scribe software for clinical note transcription with the patient, who gave verbal consent to proceed.  History of Present Illness Theresa Underwood is an 83 year old female who presents with frequent falls.  She experiences frequent falls, with a recent incident occurring this morning where she fell backwards onto a toilet seat after using the bathroom. She hit her shin bone and possibly bumped her shoulder but is able to move without significant pain. She recalls a previous fall in the summer where she was pinned under a concrete statue, but fortunately did not sustain serious injuries.  She has a history of neuropathy and is currently taking gabapentin  600 mg three times a day. Her condition remains stable with no significant worsening. Her legs and feet hurt more on days when she stays at home and does not walk much.   She has scoliosis in her upper back and degenerative disc disease in her lower back and neck. She experiences back pain, especially after activities like shopping, and notes that her posture is worsening.  She expresses frustration with her decreased activity level and the inability to perform tasks she used to do. She and her husband receive help from their children, who live nearby.    She recently underwent right shoulder arthroplasty and is doing PT for this.    Medications:  Current Outpatient Medications on File Prior to Visit  Medication Sig Dispense Refill   Cholecalciferol (D3 VITAMIN PO) Take 2,000 Units by mouth daily.     clonazePAM  (KLONOPIN ) 0.5 MG tablet Take 0.5 mg by mouth 2 (two) times daily as needed for anxiety.     gabapentin  (NEURONTIN ) 600  MG tablet TAKE 1 TABLET(600 MG) BY MOUTH THREE TIMES . OK to take extra dose as needed. 290 tablet 3   ibuprofen (ADVIL) 200 MG tablet Take 200-400 mg by mouth every 6 (six) hours as needed for moderate pain.     lamoTRIgine  (LAMICTAL ) 200 MG tablet Take 200 mg by mouth daily.     levothyroxine  (SYNTHROID , LEVOTHROID) 112 MCG tablet Take 112 mcg by mouth at bedtime.     lisinopril  (PRINIVIL ,ZESTRIL ) 10 MG tablet Take 10 mg by mouth daily.     Multiple Vitamins-Minerals (WOMENS MULTIVITAMIN PO) Take 1 tablet by mouth daily.     mupirocin cream (BACTROBAN) 2 % Apply 1 Application topically daily as needed (irritation).     oxyCODONE -acetaminophen  (PERCOCET) 5-325 MG tablet Take 1 tablet by mouth every 4 (four) hours as needed (max 6 q). 20 tablet 0   No current facility-administered medications on file prior to visit.    Allergies:  Allergies  Allergen Reactions   Sulfa Antibiotics Hives   Carbamazepine Hives    REACTION: hives with tegretol   Ciprofloxacin      Pt has nerve damage in both of her feet   Hibiclens  [Chlorhexidine  Gluconate] Other (See Comments)    Hibiclens  caused vaginal and perineal irritation per pt    Sulfonamide Derivatives Hives  Vital Signs:  BP 130/76   Pulse 70   Ht 5' 5 (1.651 m)   Wt 148 lb (67.1 kg)   SpO2 97%   BMI 24.63 kg/m  Neurological Exam:    MENTAL STATUS including orientation to time, place, person, recent and remote memory, attention span and concentration, language, and fund of knowledge is normal.  Speech is not dysarthric.   CRANIAL NERVES:  Normal conjugate, extra-ocular eye movements in all directions of gaze.  Mild right ptosis, no worsening with sustained upgaze.   MOTOR:  Motor strength is 5/5 in all extremities, except interosseus muscles 5-/5, ABP 4+/5, and toe extensors 5-/5 bilaterally.  Tone is normal.     MSRs:                                           Right        Left brachioradialis 2+  2+  biceps 2+  2+  triceps 2+   2+  patellar 3+  3+  ankle jerk 0  0   SENSORY:  Vibration absent at the toes bilaterally, trace at the ankles, reduced at the knees and normal at MCP.    COORDINATION/GAIT:  Gait narrow based and stable, unassisted.  Unable to perform tandem gait.   Data: EMG 10/25/2013:  There is electrophysiological evidence of a multilevel cervical radiculopathy affecting the right C5, left C7, and bilateral T1 nerve roots/segment, overall mild-to-moderate degree electrically. There is no evidence generalized sensorimotor polyneuropathy or carpal tunnel syndrome affecting the upper extremities.  Labs 10/05/2013:  Vitamin B12 291*, SPEP/UPEP with IFE - no M protein, copper  139  MRI of the lumbar spine without contrast 12/07/2004: Multilevel spondylosis, advanced at L3-4 L5-S1, and moderate to mild spondylosis L3-4 and mild spinal stenosis at L4-5. An osteophyte at L5-S1 might encroach the extraforaminal portion of the left L5 nerve root. There is no disc herniation.     Total time spent reviewing records, interview, history/exam, documentation, and coordination of care on day of encounter:  30 minutes   Thank you for allowing me to participate in patient's care.  If I can answer any additional questions, I would be pleased to do so.    Sincerely,    Maiya Kates K. Tobie, DO

## 2024-09-22 ENCOUNTER — Ambulatory Visit: Admitting: Neurology

## 2025-04-19 ENCOUNTER — Ambulatory Visit: Payer: Self-pay | Admitting: Neurology
